# Patient Record
Sex: Female | Born: 1980 | Race: White | Hispanic: No | Marital: Single | Smoking: Current every day smoker
Health system: Southern US, Community
[De-identification: ages and names within clinical notes are randomized; demographics above are authoritative.]

## PROBLEM LIST (undated history)

## (undated) DIAGNOSIS — O139 Gestational [pregnancy-induced] hypertension without significant proteinuria, unspecified trimester: Secondary | ICD-10-CM

## (undated) DIAGNOSIS — O1490 Unspecified pre-eclampsia, unspecified trimester: Secondary | ICD-10-CM

## (undated) DIAGNOSIS — Z87442 Personal history of urinary calculi: Secondary | ICD-10-CM

## (undated) HISTORY — PX: COLPOSCOPY: SHX161

---

## 1999-10-18 DIAGNOSIS — O1490 Unspecified pre-eclampsia, unspecified trimester: Secondary | ICD-10-CM

## 2000-11-07 HISTORY — PX: GYNECOLOGIC CRYOSURGERY: SHX857

## 2003-03-27 ENCOUNTER — Encounter (HOSPITAL_COMMUNITY): Admission: RE | Admit: 2003-03-27 | Discharge: 2003-04-10 | Payer: Self-pay | Admitting: Rheumatology

## 2003-06-29 ENCOUNTER — Emergency Department (HOSPITAL_COMMUNITY): Admission: EM | Admit: 2003-06-29 | Discharge: 2003-06-29 | Payer: Self-pay | Admitting: *Deleted

## 2004-07-06 ENCOUNTER — Emergency Department (HOSPITAL_COMMUNITY): Admission: EM | Admit: 2004-07-06 | Discharge: 2004-07-06 | Payer: Self-pay | Admitting: Emergency Medicine

## 2007-01-14 ENCOUNTER — Emergency Department (HOSPITAL_COMMUNITY): Admission: EM | Admit: 2007-01-14 | Discharge: 2007-01-14 | Payer: Self-pay | Admitting: Emergency Medicine

## 2009-01-18 ENCOUNTER — Emergency Department (HOSPITAL_COMMUNITY): Admission: EM | Admit: 2009-01-18 | Discharge: 2009-01-18 | Payer: Self-pay | Admitting: Emergency Medicine

## 2010-05-17 ENCOUNTER — Emergency Department (HOSPITAL_COMMUNITY): Admission: EM | Admit: 2010-05-17 | Discharge: 2010-05-17 | Payer: Self-pay | Admitting: Emergency Medicine

## 2010-09-21 LAB — URINALYSIS, ROUTINE W REFLEX MICROSCOPIC
Bilirubin Urine: NEGATIVE
Leukocytes, UA: NEGATIVE
Nitrite: POSITIVE — AB
Specific Gravity, Urine: >1.03 — ABNORMAL HIGH (ref 1.005–1.030)
Urobilinogen, UA: 1 mg/dL (ref 0.0–1.0)
pH: 6 (ref 5.0–8.0)

## 2010-09-21 LAB — PREGNANCY, URINE: Preg Test, Ur: NEGATIVE

## 2010-09-21 LAB — RPR: RPR Ser Ql: NONREACTIVE

## 2010-09-21 LAB — URINE MICROSCOPIC-ADD ON

## 2010-09-21 LAB — WET PREP, GENITAL: Trich, Wet Prep: NONE SEEN

## 2010-09-21 LAB — GC/CHLAMYDIA PROBE AMP, GENITAL: Chlamydia, DNA Probe: NEGATIVE

## 2010-11-26 NOTE — Consult Note (Signed)
NAME:  Santita, Hunsberger NO.:  [id number]   MEDICAL RECORD NO.:  [id number]                   PATIENT TYPE:   LOCATION:                                       FACILITY:   PHYSICIAN:  Aundra Dubin, M.D.            DATE OF BIRTH:   DATE OF CONSULTATION:  03/27/2003  DATE OF DISCHARGE:                                   CONSULTATION   REFERRING PHYSICIAN:  Karsten Ro, M.D., [facility], [place]   CHIEF COMPLAINT:  Right hand.   HISTORY OF PRESENT ILLNESS:  Ms. Cerullo is being seen at [facility] as a referral  from the [facility].  For about four months, she has had what  she describes as severe hand swelling to the point where the fingers will  lock.  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  She  has been treated with prednisone for probably one week which did help some.  There is no numbness or tingling.  The left hand wrist does not bother her  as much, and there is no throbbing to the left hand.  She aches some in the  right knee, and this has been a problem for several years.  There has been  no swollen joints.   REVIEW OF SYSTEMS:  Her energy level is good.  She has had no fever.  She  has had problems with boils in the axilla.  There has been no weight loss.  She says she keeps a headache.  There has been no diarrhea, constipation or  a lot of mucous.  There has been no chest pain or shortness of breath.   PAST MEDICAL/SURGICAL HISTORY:  1. C-section 2001.  2. Hypertension.  She is on no medications for this.   MEDICATIONS:  Oral contraceptive.   ALLERGIES:  Drug intolerance is none.   FAMILY HISTORY:  Father died at age 51 with brain cancer.  Sister age 79  died with cancer.   SOCIAL HISTORY:  She is [place] native.  She is single and lives with her  daughter.  She works as a Child psychotherapist, again, smoking at age 1, and smokes a  pack of cigarettes a day.   PHYSICAL EXAMINATION:  VITAL SIGNS:   Weight 145 pounds, height 5 feet 3  inches, blood pressure 110/70, respirations 16.  GENERAL APPEARANCE:  No distress.  SKIN:  Clear except a low back and left ankle tattoo.  HEENT:  PERL/EOMI.  Mouth:  Clear except for a tongue stud.  NECK:  Slight upper cervical adenopathy.  LUNGS:  Clear.  HEART:  Regular, no murmur.  MUSCULOSKELETAL:  The right hand shows no arthritic swelling.  Palpation  across the MTP's and PIP's is nontender.  Palpation into the right palmar  MCP's finds that they are somewhat tender, but there is no wincing.  She had  a  negative Tinel's sign.  The left hand was nonswollen, nontender with a  negative Tinel's sign.  The elbow, shoulders, left knee, ankles and feet  have a good range of motion, show no active arthritis.  The right knee  flexes well to 135 degrees without tenderness.  She had mild to moderate  lateral joint line tenderness.  NEUROLOGICAL:  Nonfocal.   ASSESSMENT/PLAN:  1. Hand pain.  I suspect that she may have some trigger finger or tendonitis     to the tendon sheath.  Although her Tinel's sign seemed to be negative,     possibly this could be a carpal tunnel syndrome.  I have given her a     prescription for Naproxen 500 mg b.i.d.  She says only the prednisone has     helped in the past, but I certainly would not keep her on this except for     periodic treatment.  If the hand does not improve, then she may need to     be worked up more fully for carpal tunnel.  2. Smoking.  3. Right knee.  This is a fairly old problem which still persists.  She has     episodic swelling, possibly the Naproxen will help with this.  Possibly a     cortisone injection should be performed to the knee if it is continuing     to be problematic.   FOLLOWUP:  She will return on a p.r.n. basis.                                               Aundra Dubin, M.D.    WWT/MEDQ  D:  03/27/2003  T:  03/28/2003  Job:  [id number]   cc:   [facility]  [place]  [address]  [place], [place] [address]  Fax: [phone]

## 2014-05-12 ENCOUNTER — Other Ambulatory Visit: Payer: Self-pay | Admitting: Obstetrics and Gynecology

## 2014-05-12 DIAGNOSIS — O3680X0 Pregnancy with inconclusive fetal viability, not applicable or unspecified: Secondary | ICD-10-CM

## 2014-05-14 ENCOUNTER — Encounter: Payer: Self-pay | Admitting: Obstetrics and Gynecology

## 2014-05-14 ENCOUNTER — Other Ambulatory Visit: Payer: Self-pay | Admitting: Obstetrics and Gynecology

## 2014-05-14 ENCOUNTER — Ambulatory Visit (INDEPENDENT_AMBULATORY_CARE_PROVIDER_SITE_OTHER): Payer: Medicaid Other

## 2014-05-14 DIAGNOSIS — O09521 Supervision of elderly multigravida, first trimester: Secondary | ICD-10-CM

## 2014-05-14 DIAGNOSIS — O30001 Twin pregnancy, unspecified number of placenta and unspecified number of amniotic sacs, first trimester: Secondary | ICD-10-CM

## 2014-05-14 DIAGNOSIS — O09291 Supervision of pregnancy with other poor reproductive or obstetric history, first trimester: Secondary | ICD-10-CM

## 2014-05-14 DIAGNOSIS — O3680X0 Pregnancy with inconclusive fetal viability, not applicable or unspecified: Secondary | ICD-10-CM

## 2014-05-14 NOTE — Progress Notes (Signed)
U/S-Twin IUP Di/Di cx appears closed, bilateral adnexa appears WNL, Baby A- CRL c/w 8+5wks +FCA noted FHR-175 bpm, Baby B- CRL c/w 8+5 wks, +FCA noted FHR-184 bpm

## 2014-05-19 LAB — US OB COMP LESS 14 WKS

## 2014-05-26 ENCOUNTER — Other Ambulatory Visit (HOSPITAL_COMMUNITY)
Admission: RE | Admit: 2014-05-26 | Discharge: 2014-05-26 | Disposition: A | Discharge status: Home / Self Care | Payer: Medicaid Other | Source: Ambulatory Visit | Attending: Obstetrics & Gynecology | Admitting: Obstetrics & Gynecology

## 2014-05-26 ENCOUNTER — Ambulatory Visit (INDEPENDENT_AMBULATORY_CARE_PROVIDER_SITE_OTHER): Payer: Medicaid Other | Admitting: Women's Health

## 2014-05-26 ENCOUNTER — Encounter: Payer: Self-pay | Admitting: Women's Health

## 2014-05-26 VITALS — BP 124/78 | Ht 62.0 in | Wt 162.0 lb

## 2014-05-26 DIAGNOSIS — O09299 Supervision of pregnancy with other poor reproductive or obstetric history, unspecified trimester: Secondary | ICD-10-CM | POA: Insufficient documentation

## 2014-05-26 DIAGNOSIS — Z114 Encounter for screening for human immunodeficiency virus [HIV]: Secondary | ICD-10-CM

## 2014-05-26 DIAGNOSIS — O09291 Supervision of pregnancy with other poor reproductive or obstetric history, first trimester: Secondary | ICD-10-CM

## 2014-05-26 DIAGNOSIS — Z113 Encounter for screening for infections with a predominantly sexual mode of transmission: Secondary | ICD-10-CM | POA: Insufficient documentation

## 2014-05-26 DIAGNOSIS — Z01419 Encounter for gynecological examination (general) (routine) without abnormal findings: Secondary | ICD-10-CM | POA: Insufficient documentation

## 2014-05-26 DIAGNOSIS — Z3491 Encounter for supervision of normal pregnancy, unspecified, first trimester: Secondary | ICD-10-CM

## 2014-05-26 DIAGNOSIS — Z98891 History of uterine scar from previous surgery: Secondary | ICD-10-CM

## 2014-05-26 DIAGNOSIS — Z0283 Encounter for blood-alcohol and blood-drug test: Secondary | ICD-10-CM

## 2014-05-26 DIAGNOSIS — O30042 Twin pregnancy, dichorionic/diamniotic, second trimester: Secondary | ICD-10-CM

## 2014-05-26 DIAGNOSIS — Z331 Pregnant state, incidental: Secondary | ICD-10-CM

## 2014-05-26 DIAGNOSIS — Z0184 Encounter for antibody response examination: Secondary | ICD-10-CM

## 2014-05-26 DIAGNOSIS — Z1151 Encounter for screening for human papillomavirus (HPV): Secondary | ICD-10-CM | POA: Diagnosis present

## 2014-05-26 DIAGNOSIS — Z3401 Encounter for supervision of normal first pregnancy, first trimester: Secondary | ICD-10-CM

## 2014-05-26 DIAGNOSIS — O30041 Twin pregnancy, dichorionic/diamniotic, first trimester: Secondary | ICD-10-CM

## 2014-05-26 DIAGNOSIS — O09521 Supervision of elderly multigravida, first trimester: Secondary | ICD-10-CM

## 2014-05-26 DIAGNOSIS — O0991 Supervision of high risk pregnancy, unspecified, first trimester: Secondary | ICD-10-CM

## 2014-05-26 DIAGNOSIS — Z124 Encounter for screening for malignant neoplasm of cervix: Secondary | ICD-10-CM

## 2014-05-26 DIAGNOSIS — Z1389 Encounter for screening for other disorder: Secondary | ICD-10-CM

## 2014-05-26 DIAGNOSIS — O09891 Supervision of other high risk pregnancies, first trimester: Secondary | ICD-10-CM

## 2014-05-26 DIAGNOSIS — O09529 Supervision of elderly multigravida, unspecified trimester: Secondary | ICD-10-CM | POA: Insufficient documentation

## 2014-05-26 DIAGNOSIS — O30049 Twin pregnancy, dichorionic/diamniotic, unspecified trimester: Secondary | ICD-10-CM | POA: Insufficient documentation

## 2014-05-26 DIAGNOSIS — O09899 Supervision of other high risk pregnancies, unspecified trimester: Secondary | ICD-10-CM | POA: Insufficient documentation

## 2014-05-26 DIAGNOSIS — F17200 Nicotine dependence, unspecified, uncomplicated: Secondary | ICD-10-CM

## 2014-05-26 LAB — POCT URINALYSIS DIPSTICK
Glucose, UA: NEGATIVE
KETONES UA: NEGATIVE
NITRITE UA: NEGATIVE
PROTEIN UA: NEGATIVE

## 2014-05-26 MED ORDER — CONCEPT DHA 53.5-38-1 MG PO CAPS: 1.0000 | ORAL_CAPSULE | Freq: Every day | ORAL | Status: DC

## 2014-05-26 NOTE — Progress Notes (Signed)
Subjective:  Pamela Horn is a 33 y.o. 172P0101 Caucasian female at 8057w3d by 8wk u/s, being seen today for her first obstetrical visit.  Her obstetrical history is significant for smoker: 3ppd prior to pregnancy, now down to 1/2ppd and wants to quit. [facility]- unsure of incision- possibly interested in vbac if possible.  Pregnancy history fully reviewed. Last pap @ 33yo, was abnormal, had 'procedure' where they told her pre-cancerous cells, was afraid to go back  Patient reports no complaints. Denies vb, cramping, uti s/s, abnormal/malodorous vag d/c, or vulvovaginal itching/irritation.  BP 124/78 mmHg  Ht 5\' 2"  (1.575 m)  Wt 162 lb (73.483 kg)  BMI 29.62 kg/m2  LMP 03/03/2014 (Within Weeks)  HISTORY: OB History  Gravida Para Term Preterm AB SAB TAB Ectopic Multiple Living  2 1  1      1     # Outcome Date GA Lbr Len/2nd Weight Sex Delivery Anes PTL Lv  2 Current           1 Preterm 10/18/99 8940w0d  1 lb 2 oz (0.51 kg) F CS-Unspec EPI  Y     Complications: Pre-eclampsia,Toxemia in pregnancy     History reviewed. No pertinent past medical history. Past Surgical History  Procedure Laterality Date  . Colposcopy  04/04/2000 & 10/03/2000  . Gynecologic cryosurgery  11/07/2000  . Cesarean section  10/18/1999   Family History  Problem Relation Age of Onset  . Hypertension Mother   . Cancer Father     melanoma  . Cancer Sister   . Diabetes Paternal Grandfather     Exam   System:     General: Well developed & nourished, no acute distress   Skin: Warm & dry, normal coloration and turgor, no rashes   Neurologic: Alert & oriented, normal mood   Cardiovascular: Regular rate & rhythm   Respiratory: Effort & rate normal, LCTAB, acyanotic   Abdomen: Soft, non tender   Extremities: normal strength, tone   Pelvic Exam:    Perineum: Normal perineum   Vulva: Normal, no lesions   Vagina:  Normal mucosa, normal discharge   Cervix: Normal,  bulbous, appears closed   Uterus: Normal size/shape/contour for GA   Thin prep pap smear obtained w/ high risk HPV cotesting FHR: 172/184 via doppler   Assessment:   Pregnancy: [id number] Patient Active Problem List   Diagnosis Date Noted  . Supervision of other high-risk pregnancy 05/26/2014    Priority: High  . Dichorionic diamniotic twin gestation 05/26/2014    Priority: High  . History of cesarean delivery 05/26/2014    Priority: High  . Hx of preeclampsia, prior pregnancy, currently pregnant 05/26/2014    Priority: High  . AMA (advanced maternal age) multigravida 35+ 05/26/2014    Priority: High  . Smoker 05/26/2014    Priority: High    1157w3d G2P0101 New OB visit Smoker, wants to quit H/O severe pre-e w/ 25wk c/s H/O 25wk c/s, unknown incision H/O abnormal pap Di-Di Twin gestation AMA   Plan:  Initial labs drawn Continue prenatal vitamins Problem list reviewed and updated Reviewed n/v relief measures and warning s/s to report Reviewed recommended weight gain based on pre-gravid BMI Encouraged well-balanced diet Genetic Screening discussed Integrated Screen: declined Cystic fibrosis screening discussed declined Ultrasound discussed; fetal survey: requested Follow up in 4 weeks for hrob w/ MD [facility] completed [facility] Start 81mg  baby asa daily at [employer]12wks  Smokes 1/2ppd-  wants to quit, advised cessation, discussed risks to fetus while pregnant, to infant pp, and to herself. Offered QuitlineNC, accepted, referral sent.     Marge DuncansBooker, Rebekah Zackery Randall CNM, [facility] 05/26/2014 4:44 PM

## 2014-05-26 NOTE — Patient Instructions (Signed)
Begin taking a 81mg  baby aspirin daily at 12 weeks (06/06/14) of pregnancy to decrease risk of preeclampsia during pregnancy   Nausea & Vomiting  Have saltine crackers or pretzels by your bed and eat a few bites before you raise your head out of bed in the morning  Eat small frequent meals throughout the day instead of large meals  Drink plenty of fluids throughout the day to stay hydrated, just don't drink a lot of fluids with your meals.  This can make your stomach fill up faster making you feel sick  Do not brush your teeth right after you eat  Products with real ginger are good for nausea, like ginger ale and ginger hard candy Make sure it says made with real ginger!  Sucking on sour candy like lemon heads is also good for nausea  If your prenatal vitamins make you nauseated, take them at night so you will sleep through the nausea  Sea Bands  If you feel like you need medicine for the nausea & vomiting please let us know  If you are unable to keep any fluids or food down please let us know    First Trimester of Pregnancy The first trimester of pregnancy is from week 1 until the end of week 12 (months 1 through 3). A week after a sperm fertilizes an egg, the egg will implant on the wall of the uterus. This embryo will begin to develop into a baby. Genes from you and your partner are forming the baby. The female genes determine whether the baby is a boy or a girl. At 6-8 weeks, the eyes and face are formed, and the heartbeat can be seen on ultrasound. At the end of 12 weeks, all the baby's organs are formed.  Now that you are pregnant, you will want to do everything you can to have a healthy baby. Two of the most important things are to get good prenatal care and to follow your health care provider's instructions. Prenatal care is all the medical care you receive before the baby's birth. This care will help prevent, find, and treat any problems during the pregnancy and childbirth. BODY  CHANGES Your body goes through many changes during pregnancy. The changes vary from woman to woman.  11. You may gain or lose a couple of pounds at first. 12. You may feel sick to your stomach (nauseous) and throw up (vomit). If the vomiting is uncontrollable, call your health care provider. 13. You may tire easily. 14. You may develop headaches that can be relieved by medicines approved by your health care provider. 15. You may urinate more often. Painful urination may mean you have a bladder infection. 16. You may develop heartburn as a result of your pregnancy. 17. You may develop constipation because certain hormones are causing the muscles that push waste through your intestines to slow down. 18. You may develop hemorrhoids or swollen, bulging veins (varicose veins). 19. Your breasts may begin to grow larger and become tender. Your nipples may stick out more, and the tissue that surrounds them (areola) may become darker. 20. Your gums may bleed and may be sensitive to brushing and flossing. 21. Dark spots or blotches (chloasma, mask of pregnancy) may develop on your face. This will likely fade after the baby is born. 22. Your menstrual periods will stop. 23. You may have a loss of appetite. 24. You may develop cravings for certain kinds of food. 25. You may have changes in your emotions from  day to day, such as being excited to be pregnant or being concerned that something may go wrong with the pregnancy and baby. 26. You may have more vivid and strange dreams. 27. You may have changes in your hair. These can include thickening of your hair, rapid growth, and changes in texture. Some women also have hair loss during or after pregnancy, or hair that feels dry or thin. Your hair will most likely return to normal after your baby is born. WHAT TO EXPECT AT YOUR PRENATAL VISITS During a routine prenatal visit:  You will be weighed to make sure you and the baby are growing normally.  Your  blood pressure will be taken.  Your abdomen will be measured to track your baby's growth.  The fetal heartbeat will be listened to starting around week 10 or 12 of your pregnancy.  Test results from any previous visits will be discussed. Your health care provider may ask you:  How you are feeling.  If you are feeling the baby move.  If you have had any abnormal symptoms, such as leaking fluid, bleeding, severe headaches, or abdominal cramping.  If you have any questions. Other tests that may be performed during your first trimester include:  Blood tests to find your blood type and to check for the presence of any previous infections. They will also be used to check for low iron levels (anemia) and Rh antibodies. Later in the pregnancy, blood tests for diabetes will be done along with other tests if problems develop.  Urine tests to check for infections, diabetes, or protein in the urine.  An ultrasound to confirm the proper growth and development of the baby.  An amniocentesis to check for possible genetic problems.  Fetal screens for spina bifida and Down syndrome.  You may need other tests to make sure you and the baby are doing well. HOME CARE INSTRUCTIONS  Medicines  Follow your health care provider's instructions regarding medicine use. Specific medicines may be either safe or unsafe to take during pregnancy.  Take your prenatal vitamins as directed.  If you develop constipation, try taking a stool softener if your health care provider approves. Diet  Eat regular, well-balanced meals. Choose a variety of foods, such as meat or vegetable-based protein, fish, milk and low-fat dairy products, vegetables, fruits, and whole grain breads and cereals. Your health care provider will help you determine the amount of weight gain that is right for you.  Avoid raw meat and uncooked cheese. These carry germs that can cause birth defects in the baby.  Eating four or five small meals  rather than three large meals a day may help relieve nausea and vomiting. If you start to feel nauseous, eating a few soda crackers can be helpful. Drinking liquids between meals instead of during meals also seems to help nausea and vomiting.  If you develop constipation, eat more high-fiber foods, such as fresh vegetables or fruit and whole grains. Drink enough fluids to keep your urine clear or pale yellow. Activity and Exercise  Exercise only as directed by your health care provider. Exercising will help you:  Control your weight.  Stay in shape.  Be prepared for labor and delivery.  Experiencing pain or cramping in the lower abdomen or low back is a good sign that you should stop exercising. Check with your health care provider before continuing normal exercises.  Try to avoid standing for long periods of time. Move your legs often if you must stand in  one place for a long time.  Avoid heavy lifting.  Wear low-heeled shoes, and practice good posture.  You may continue to have sex unless your health care provider directs you otherwise. Relief of Pain or Discomfort  Wear a good support bra for breast tenderness.   Take warm sitz baths to soothe any pain or discomfort caused by hemorrhoids. Use hemorrhoid cream if your health care provider approves.   Rest with your legs elevated if you have leg cramps or low back pain.  If you develop varicose veins in your legs, wear support hose. Elevate your feet for 15 minutes, 3-4 times a day. Limit salt in your diet. Prenatal Care  Schedule your prenatal visits by the twelfth week of pregnancy. They are usually scheduled monthly at first, then more often in the last 2 months before delivery.  Write down your questions. Take them to your prenatal visits.  Keep all your prenatal visits as directed by your health care provider. Safety  Wear your seat belt at all times when driving.  Make a list of emergency phone numbers, including  numbers for family, friends, the hospital, and police and fire departments. General Tips  Ask your health care provider for a referral to a local prenatal education class. Begin classes no later than at the beginning of month 6 of your pregnancy.  Ask for help if you have counseling or nutritional needs during pregnancy. Your health care provider can offer advice or refer you to specialists for help with various needs.  Do not use hot tubs, steam rooms, or saunas.  Do not douche or use tampons or scented sanitary pads.  Do not cross your legs for long periods of time.  Avoid cat litter boxes and soil used by cats. These carry germs that can cause birth defects in the baby and possibly loss of the fetus by miscarriage or stillbirth.  Avoid all smoking, herbs, alcohol, and medicines not prescribed by your health care provider. Chemicals in these affect the formation and growth of the baby.  Schedule a dentist appointment. At home, brush your teeth with a soft toothbrush and be gentle when you floss. SEEK MEDICAL CARE IF:   You have dizziness.  You have mild pelvic cramps, pelvic pressure, or nagging pain in the abdominal area.  You have persistent nausea, vomiting, or diarrhea.  You have a bad smelling vaginal discharge.  You have pain with urination.  You notice increased swelling in your face, hands, legs, or ankles. SEEK IMMEDIATE MEDICAL CARE IF:   You have a fever.  You are leaking fluid from your vagina.  You have spotting or bleeding from your vagina.  You have severe abdominal cramping or pain.  You have rapid weight gain or loss.  You vomit blood or material that looks like coffee grounds.  You are exposed to [place] measles and have never had them.  You are exposed to fifth disease or chickenpox.  You develop a severe headache.  You have shortness of breath.  You have any kind of trauma, such as from a fall or a car accident. Document Released: 06/21/2001  Document Revised: 11/11/2013 Document Reviewed: 05/07/2013 [facility] Patient Information 2015 [employer], [place]. This information is not intended to replace advice given to you by your health care provider. Make sure you discuss any questions you have with your health care provider.

## 2014-05-27 LAB — URINALYSIS, ROUTINE W REFLEX MICROSCOPIC
BILIRUBIN URINE: NEGATIVE
GLUCOSE, UA: NEGATIVE mg/dL
Ketones, ur: NEGATIVE mg/dL
Nitrite: NEGATIVE
Protein, ur: NEGATIVE mg/dL
SPECIFIC GRAVITY, URINE: 1.024 (ref 1.005–1.030)
Urobilinogen, UA: 0.2 mg/dL (ref 0.0–1.0)
pH: 6 (ref 5.0–8.0)

## 2014-05-27 LAB — CBC
HCT: 36.3 % (ref 36.0–46.0)
HEMOGLOBIN: 12.2 g/dL (ref 12.0–15.0)
MCH: 28.6 pg (ref 26.0–34.0)
MCHC: 33.6 g/dL (ref 30.0–36.0)
MCV: 85 fL (ref 78.0–100.0)
MPV: 9 fL — AB (ref 9.4–12.4)
PLATELETS: 320 10*3/uL (ref 150–400)
RBC: 4.27 MIL/uL (ref 3.87–5.11)
RDW: 13.2 % (ref 11.5–15.5)
WBC: 13.9 10*3/uL — ABNORMAL HIGH (ref 4.0–10.5)

## 2014-05-27 LAB — DRUG SCREEN, URINE, NO CONFIRMATION
AMPHETAMINE SCRN UR: NEGATIVE
Barbiturate Quant, Ur: NEGATIVE
Benzodiazepines.: NEGATIVE
COCAINE METABOLITES: NEGATIVE
Creatinine,U: 144.2 mg/dL
MARIJUANA METABOLITE: NEGATIVE
Methadone: NEGATIVE
OPIATE SCREEN, URINE: NEGATIVE
PHENCYCLIDINE (PCP): NEGATIVE
Propoxyphene: NEGATIVE

## 2014-05-27 LAB — HIV ANTIBODY (ROUTINE TESTING W REFLEX): HIV 1&2 Ab, 4th Generation: NONREACTIVE

## 2014-05-27 LAB — URINALYSIS, MICROSCOPIC ONLY
Casts: NONE SEEN
Crystals: NONE SEEN

## 2014-05-27 LAB — OXYCODONE SCREEN, UA, RFLX CONFIRM: Oxycodone Screen, Ur: NEGATIVE ng/mL

## 2014-05-27 LAB — HEPATITIS B SURFACE ANTIGEN: HEP B S AG: NEGATIVE

## 2014-05-27 LAB — ABO AND RH: Rh Type: POSITIVE

## 2014-05-27 LAB — VARICELLA ZOSTER ANTIBODY, IGG: VARICELLA IGG: 211 {index} — AB (ref <135.00)

## 2014-05-27 LAB — RUBELLA SCREEN: Rubella: 2.39 Index — ABNORMAL HIGH (ref <0.90)

## 2014-05-27 LAB — RPR

## 2014-05-27 LAB — ANTIBODY SCREEN: ANTIBODY SCREEN: NEGATIVE

## 2014-05-28 ENCOUNTER — Telehealth: Payer: Self-pay | Admitting: Women's Health

## 2014-05-28 DIAGNOSIS — R8271 Bacteriuria: Secondary | ICD-10-CM | POA: Insufficient documentation

## 2014-05-28 DIAGNOSIS — O9989 Other specified diseases and conditions complicating pregnancy, childbirth and the puerperium: Principal | ICD-10-CM

## 2014-05-28 DIAGNOSIS — O99891 Other specified diseases and conditions complicating pregnancy: Secondary | ICD-10-CM

## 2014-05-28 LAB — CYTOLOGY - PAP

## 2014-05-28 MED ORDER — CEPHALEXIN 500 MG PO CAPS: 500.0000 mg | ORAL_CAPSULE | Freq: Four times a day (QID) | ORAL | Status: DC

## 2014-05-28 NOTE — Telephone Encounter (Signed)
Notified pt of UTI, Rx for Keflex sent to pharmacy.  Cheral MarkerKimberly R. Dejaun Vidrio, CNM, [facility]NP-BC 05/28/2014 3:16 PM

## 2014-05-29 LAB — URINE CULTURE

## 2014-06-03 ENCOUNTER — Ambulatory Visit (INDEPENDENT_AMBULATORY_CARE_PROVIDER_SITE_OTHER): Payer: Medicaid Other | Admitting: Obstetrics & Gynecology

## 2014-06-03 ENCOUNTER — Encounter: Payer: Self-pay | Admitting: Obstetrics & Gynecology

## 2014-06-03 VITALS — BP 110/70 | Wt 162.0 lb

## 2014-06-03 DIAGNOSIS — O30041 Twin pregnancy, dichorionic/diamniotic, first trimester: Secondary | ICD-10-CM

## 2014-06-03 DIAGNOSIS — B351 Tinea unguium: Secondary | ICD-10-CM

## 2014-06-03 DIAGNOSIS — Z331 Pregnant state, incidental: Secondary | ICD-10-CM

## 2014-06-03 NOTE — Progress Notes (Signed)
Pt has both great toes with nail fungus Treated with gentian violet 1%, home management discussed Follow up 2 weeks Begin baby ASA

## 2014-06-17 ENCOUNTER — Ambulatory Visit (INDEPENDENT_AMBULATORY_CARE_PROVIDER_SITE_OTHER): Payer: Medicaid Other | Admitting: Obstetrics & Gynecology

## 2014-06-17 VITALS — BP 124/68 | Wt 166.0 lb

## 2014-06-17 DIAGNOSIS — O0992 Supervision of high risk pregnancy, unspecified, second trimester: Secondary | ICD-10-CM

## 2014-06-17 DIAGNOSIS — Z1389 Encounter for screening for other disorder: Secondary | ICD-10-CM

## 2014-06-17 DIAGNOSIS — O30042 Twin pregnancy, dichorionic/diamniotic, second trimester: Secondary | ICD-10-CM

## 2014-06-17 DIAGNOSIS — Z331 Pregnant state, incidental: Secondary | ICD-10-CM

## 2014-06-17 LAB — POCT URINALYSIS DIPSTICK
Blood, UA: NEGATIVE
Glucose, UA: NEGATIVE
KETONES UA: NEGATIVE
NITRITE UA: NEGATIVE
PROTEIN UA: NEGATIVE

## 2014-06-17 NOTE — Progress Notes (Signed)
Declines integrated testing Toenails are much better on gentian violet   High Risk Pregnancy Diagnosis(es):   Twins and history of severe pre eclampsia  G2P0101 9965w4d Estimated Date of Delivery: 12/19/14  Blood pressure 124/68, weight 166 lb (75.297 kg), last menstrual period 03/03/2014.  Urinalysis: Negative   HPI: no complaints     BP weight and urine results all reviewed and noted. Patient reports good fetal movement, denies any bleeding and no rupture of membranes symptoms or regular contractions.  Fundal Height:  na Fetal Heart rate:  172 Edema:  none  Patient is without complaints. All questions were answered.  Assessment:  7265w4d,   twins  Medication(s) Plans:  Baby ASA  Treatment Plan:  twins  Follow up in keep weeks for OB appt,

## 2014-06-23 ENCOUNTER — Encounter: Payer: Self-pay | Admitting: Obstetrics & Gynecology

## 2014-06-23 ENCOUNTER — Other Ambulatory Visit (INDEPENDENT_AMBULATORY_CARE_PROVIDER_SITE_OTHER): Payer: Medicaid Other

## 2014-06-23 ENCOUNTER — Other Ambulatory Visit: Payer: Self-pay | Admitting: Obstetrics & Gynecology

## 2014-06-23 ENCOUNTER — Ambulatory Visit (INDEPENDENT_AMBULATORY_CARE_PROVIDER_SITE_OTHER): Payer: Medicaid Other | Admitting: Obstetrics & Gynecology

## 2014-06-23 VITALS — BP 120/70 | Wt 171.0 lb

## 2014-06-23 DIAGNOSIS — O30042 Twin pregnancy, dichorionic/diamniotic, second trimester: Secondary | ICD-10-CM

## 2014-06-23 DIAGNOSIS — O09522 Supervision of elderly multigravida, second trimester: Secondary | ICD-10-CM

## 2014-06-23 DIAGNOSIS — Z1389 Encounter for screening for other disorder: Secondary | ICD-10-CM

## 2014-06-23 DIAGNOSIS — O0992 Supervision of high risk pregnancy, unspecified, second trimester: Secondary | ICD-10-CM

## 2014-06-23 DIAGNOSIS — Z331 Pregnant state, incidental: Secondary | ICD-10-CM

## 2014-06-23 LAB — US OB LIMITED

## 2014-06-23 LAB — POCT URINALYSIS DIPSTICK
Glucose, UA: NEGATIVE
Ketones, UA: NEGATIVE
LEUKOCYTES UA: NEGATIVE
NITRITE UA: NEGATIVE
PROTEIN UA: NEGATIVE
RBC UA: NEGATIVE

## 2014-06-23 NOTE — Progress Notes (Signed)
Sonogram is noted Report is done  No complaints No bleeding follow up in 3weeks

## 2014-06-23 NOTE — Progress Notes (Signed)
U/S(14+3wks)-Twin IUP Diamniotic/Dichorionic +membrane noted, cx appears closed (3+cm), bilateral adnexa appears, All Posterior Gr 0 placenta, Baby A- active fetus meas c/w dates, FHR-148 bpm ??Girl fetus, baby B- active fetus, meas c/w dates, FHR-161 bpm, ??Boy

## 2014-07-14 ENCOUNTER — Encounter: Payer: Self-pay | Admitting: Obstetrics & Gynecology

## 2014-07-14 ENCOUNTER — Ambulatory Visit (INDEPENDENT_AMBULATORY_CARE_PROVIDER_SITE_OTHER): Payer: Medicaid Other | Admitting: Obstetrics & Gynecology

## 2014-07-14 VITALS — BP 140/80 | Wt 179.0 lb

## 2014-07-14 DIAGNOSIS — O30042 Twin pregnancy, dichorionic/diamniotic, second trimester: Secondary | ICD-10-CM

## 2014-07-14 DIAGNOSIS — O0992 Supervision of high risk pregnancy, unspecified, second trimester: Secondary | ICD-10-CM

## 2014-07-14 DIAGNOSIS — IMO0001 Reserved for inherently not codable concepts without codable children: Secondary | ICD-10-CM

## 2014-07-14 NOTE — Progress Notes (Signed)
High Risk Pregnancy Diagnosis(es):   Pamela Horn Twins  G2P0101 [redacted]w[redacted]d Estimated Date of Delivery: 12/19/14  Blood pressure 140/80, weight 179 lb (81.194 kg), last menstrual period 03/03/2014.  Urinalysis: Positive for 1+ protein   HPI: C/O muscle cramps, discussed electrolytes and also compression of right knee     BP weight and urine results all reviewed and noted. Patient reports good fetal movement, denies any bleeding and no rupture of membranes symptoms or regular contractions.  Fundal Height:  20 Fetal Heart rate:  145 Edema:  trace  Patient is without complaints. All questions were answered.  Assessment:  [redacted]w[redacted]d,   Pamela Horn twins  Medication(s) Plans:  No changes  Treatment Plan:  Sonogram 3 weeks  Follow up in 3 weeks for OB appt, sonogram

## 2014-07-22 ENCOUNTER — Encounter: Payer: Self-pay | Admitting: Obstetrics & Gynecology

## 2014-08-05 ENCOUNTER — Other Ambulatory Visit: Payer: Self-pay | Admitting: Obstetrics & Gynecology

## 2014-08-05 ENCOUNTER — Ambulatory Visit (INDEPENDENT_AMBULATORY_CARE_PROVIDER_SITE_OTHER): Payer: Medicaid Other

## 2014-08-05 ENCOUNTER — Ambulatory Visit (INDEPENDENT_AMBULATORY_CARE_PROVIDER_SITE_OTHER): Payer: Medicaid Other | Admitting: Obstetrics & Gynecology

## 2014-08-05 ENCOUNTER — Encounter: Payer: Self-pay | Admitting: Obstetrics & Gynecology

## 2014-08-05 VITALS — BP 110/80 | Wt 190.0 lb

## 2014-08-05 DIAGNOSIS — Z3689 Encounter for other specified antenatal screening: Secondary | ICD-10-CM

## 2014-08-05 DIAGNOSIS — O09522 Supervision of elderly multigravida, second trimester: Secondary | ICD-10-CM

## 2014-08-05 DIAGNOSIS — O09292 Supervision of pregnancy with other poor reproductive or obstetric history, second trimester: Secondary | ICD-10-CM

## 2014-08-05 DIAGNOSIS — Z98891 History of uterine scar from previous surgery: Secondary | ICD-10-CM

## 2014-08-05 DIAGNOSIS — O30042 Twin pregnancy, dichorionic/diamniotic, second trimester: Secondary | ICD-10-CM

## 2014-08-05 DIAGNOSIS — Z9889 Other specified postprocedural states: Secondary | ICD-10-CM

## 2014-08-05 DIAGNOSIS — Z1389 Encounter for screening for other disorder: Secondary | ICD-10-CM

## 2014-08-05 DIAGNOSIS — IMO0001 Reserved for inherently not codable concepts without codable children: Secondary | ICD-10-CM

## 2014-08-05 DIAGNOSIS — Z36 Encounter for antenatal screening of mother: Secondary | ICD-10-CM

## 2014-08-05 DIAGNOSIS — O0992 Supervision of high risk pregnancy, unspecified, second trimester: Secondary | ICD-10-CM

## 2014-08-05 DIAGNOSIS — Z331 Pregnant state, incidental: Secondary | ICD-10-CM

## 2014-08-05 LAB — POCT URINALYSIS DIPSTICK
GLUCOSE UA: NEGATIVE
Ketones, UA: NEGATIVE
Leukocytes, UA: NEGATIVE
NITRITE UA: NEGATIVE
PROTEIN UA: NEGATIVE
RBC UA: NEGATIVE

## 2014-08-05 LAB — US OB DETAIL + 14 WK

## 2014-08-05 NOTE — Progress Notes (Signed)
U/S(20+4wks)-Twin IUP Di/Di +membrane noted, cx appears closed (3.4cm), bilateral adnexa appears WNL, All posterior gr 0 placenta Baby A- Breech active fetus, meas c/w dates, FHR- 151 bpm, female fetus, no obvious major abnl noted Baby B- Breech active fetus, meas c/w dates, FHR- 147 bpm, female fetus, no obvious major abnl noted

## 2014-08-05 NOTE — Progress Notes (Signed)
Sonogram is normal report is done, breech/breech   High Risk Pregnancy Diagnosis(es):   DiDi twins  [id number] 7760w4d Estimated Date of Delivery: 12/19/14  Blood pressure 110/80, weight 190 lb (86.183 kg), last menstrual period 03/03/2014.  Urinalysis: Negative   HPI: No complaints     BP weight and urine results all reviewed and noted. Patient reports good fetal movement, denies any bleeding and no rupture of membranes symptoms or regular contractions.  Fundal Height:  28 Fetal Heart rate:  151/147 Edema:  trace  Patient is without complaints. All questions were answered.  Assessment:  8360w4d,   Twins  Medication(s) Plans:  No changes  Treatment Plan:  n ochanges  Follow up in 4 weeks for OB appt, routine

## 2014-09-03 ENCOUNTER — Encounter: Payer: Self-pay | Admitting: Obstetrics & Gynecology

## 2014-09-03 ENCOUNTER — Ambulatory Visit (INDEPENDENT_AMBULATORY_CARE_PROVIDER_SITE_OTHER): Payer: Medicaid Other | Admitting: Obstetrics & Gynecology

## 2014-09-03 VITALS — BP 120/80 | Wt 202.0 lb

## 2014-09-03 DIAGNOSIS — Z331 Pregnant state, incidental: Secondary | ICD-10-CM

## 2014-09-03 DIAGNOSIS — O0992 Supervision of high risk pregnancy, unspecified, second trimester: Secondary | ICD-10-CM

## 2014-09-03 DIAGNOSIS — Z1389 Encounter for screening for other disorder: Secondary | ICD-10-CM

## 2014-09-03 DIAGNOSIS — O30042 Twin pregnancy, dichorionic/diamniotic, second trimester: Secondary | ICD-10-CM

## 2014-09-03 LAB — POCT URINALYSIS DIPSTICK
Glucose, UA: NEGATIVE
Ketones, UA: NEGATIVE
Nitrite, UA: NEGATIVE
Protein, UA: NEGATIVE
RBC UA: NEGATIVE

## 2014-09-03 NOTE — Progress Notes (Signed)
Fetal Surveillance Testing today:  None today   High Risk Pregnancy Diagnosis(es):   Mercie EonDi di twins  G2P0101 9369w5d Estimated Date of Delivery: 12/19/14  Blood pressure 120/80, weight 202 lb (91.627 kg), last menstrual period 03/03/2014.  Urinalysis: Negative   HPI: The patient is being seen today for ongoing management of twins. Today she reports pressure and back pain   BP weight and urine results all reviewed and noted. Patient reports good fetal movement, denies any bleeding and no rupture of membranes symptoms or regular contractions.  Fundal Height:  38 Fetal Heart rate:  157/152 Edema:  trace  Patient is without complaints other than noted in her HPI. All questions were answered.  All lab and sonogram results have been reviewed. Comments:  Wearing a pregnancy band  Assessment:  1.  Pregnancy at 8569w5d,  Estimated Date of Delivery: 12/19/14 :                          2.  DiDi twins                        3.  Breech breech  Medication(s) Plans:  No changes  Treatment Plan:  PN2 next visit  Follow up in 2 weeks for appointment for high risk OB care, PN2

## 2014-09-03 NOTE — Addendum Note (Signed)
Addended by: Gaylyn RongEVANS, Ezreal Turay A on: 09/03/2014 04:49 PM   Modules accepted: Orders

## 2014-09-18 ENCOUNTER — Ambulatory Visit (INDEPENDENT_AMBULATORY_CARE_PROVIDER_SITE_OTHER): Payer: Medicaid Other | Admitting: Obstetrics & Gynecology

## 2014-09-18 ENCOUNTER — Other Ambulatory Visit: Payer: Medicaid Other

## 2014-09-18 ENCOUNTER — Encounter: Payer: Self-pay | Admitting: Obstetrics & Gynecology

## 2014-09-18 VITALS — BP 102/70 | HR 72 | Wt 205.0 lb

## 2014-09-18 DIAGNOSIS — O0992 Supervision of high risk pregnancy, unspecified, second trimester: Secondary | ICD-10-CM

## 2014-09-18 DIAGNOSIS — Z331 Pregnant state, incidental: Secondary | ICD-10-CM

## 2014-09-18 DIAGNOSIS — Z113 Encounter for screening for infections with a predominantly sexual mode of transmission: Secondary | ICD-10-CM

## 2014-09-18 DIAGNOSIS — Z1389 Encounter for screening for other disorder: Secondary | ICD-10-CM

## 2014-09-18 DIAGNOSIS — Z131 Encounter for screening for diabetes mellitus: Secondary | ICD-10-CM

## 2014-09-18 DIAGNOSIS — Z114 Encounter for screening for human immunodeficiency virus [HIV]: Secondary | ICD-10-CM

## 2014-09-18 DIAGNOSIS — Z0184 Encounter for antibody response examination: Secondary | ICD-10-CM

## 2014-09-18 DIAGNOSIS — Z3483 Encounter for supervision of other normal pregnancy, third trimester: Secondary | ICD-10-CM

## 2014-09-18 DIAGNOSIS — O30042 Twin pregnancy, dichorionic/diamniotic, second trimester: Secondary | ICD-10-CM

## 2014-09-18 LAB — POCT URINALYSIS DIPSTICK
Blood, UA: NEGATIVE
GLUCOSE UA: NEGATIVE
KETONES UA: NEGATIVE
NITRITE UA: NEGATIVE
PROTEIN UA: NEGATIVE

## 2014-09-18 NOTE — Addendum Note (Signed)
Addended by: Criss AlvinePULLIAM, CHRYSTAL G on: 09/18/2014 11:06 AM   Modules accepted: Orders

## 2014-09-18 NOTE — Progress Notes (Signed)
Fetal Surveillance Testing today:  None   High Risk Pregnancy Diagnosis(es):   DiDi twins  [id number] 6343w6d Estimated Date of Delivery: 12/19/14  Blood pressure 102/70, pulse 72, weight 205 lb (92.987 kg), last menstrual period 03/03/2014.  Urinalysis: Negative   HPI: The patient is being seen today for ongoing management of Pamela Horn di twins. Today she reports back and pelvic pain   BP weight and urine results all reviewed and noted. Patient reports good fetal movement, denies any bleeding and no rupture of membranes symptoms or regular contractions.  Fundal Height:  38 Fetal Heart rate:  152/146 Edema:  none  Patient is without complaints other than noted in her HPI. All questions were answered.  All lab and sonogram results have been reviewed. Comments: abnormal: tqwins  Assessment:  1.  Pregnancy at 5443w6d,  Estimated Date of Delivery: 12/19/14 :                          2.  DiDi twins                        3.  Histroy of severe pre eclampsia  Medication(s) Plans:  Baby ASA  Treatment Plan:  Pt "forgot" she needed to be fasting today  Follow up in 2 weeks for appointment for high risk OB care, sonogra Monday PN2  Coming out of work at 28 weeks

## 2014-09-22 ENCOUNTER — Other Ambulatory Visit: Payer: Medicaid Other

## 2014-09-22 DIAGNOSIS — Z0184 Encounter for antibody response examination: Secondary | ICD-10-CM

## 2014-09-22 DIAGNOSIS — Z113 Encounter for screening for infections with a predominantly sexual mode of transmission: Secondary | ICD-10-CM

## 2014-09-22 DIAGNOSIS — Z3483 Encounter for supervision of other normal pregnancy, third trimester: Secondary | ICD-10-CM

## 2014-09-22 DIAGNOSIS — Z331 Pregnant state, incidental: Secondary | ICD-10-CM

## 2014-09-22 DIAGNOSIS — Z131 Encounter for screening for diabetes mellitus: Secondary | ICD-10-CM

## 2014-09-22 DIAGNOSIS — Z114 Encounter for screening for human immunodeficiency virus [HIV]: Secondary | ICD-10-CM

## 2014-09-23 LAB — GLUCOSE TOLERANCE, 2 HOURS W/ 1HR
GLUCOSE, 1 HOUR: 164 mg/dL (ref 65–179)
Glucose, 2 hour: 131 mg/dL (ref 65–152)
Glucose, Fasting: 92 mg/dL — ABNORMAL HIGH (ref 65–91)

## 2014-09-23 LAB — CBC
HCT: 33.2 % — ABNORMAL LOW (ref 34.0–46.6)
Hemoglobin: 11.2 g/dL (ref 11.1–15.9)
MCH: 29.5 pg (ref 26.6–33.0)
MCHC: 33.7 g/dL (ref 31.5–35.7)
MCV: 87 fL (ref 79–97)
PLATELETS: 319 10*3/uL (ref 150–379)
RBC: 3.8 x10E6/uL (ref 3.77–5.28)
RDW: 13.5 % (ref 12.3–15.4)
WBC: 17.9 10*3/uL — ABNORMAL HIGH (ref 3.4–10.8)

## 2014-09-23 LAB — HSV 2 ANTIBODY, IGG: HSV 2 Glycoprotein G Ab, IgG: <0.91 index (ref 0.00–0.90)

## 2014-09-23 LAB — RPR: RPR Ser Ql: NONREACTIVE

## 2014-09-23 LAB — HIV ANTIBODY (ROUTINE TESTING W REFLEX): HIV SCREEN 4TH GENERATION: NONREACTIVE

## 2014-09-23 LAB — ANTIBODY SCREEN: Antibody Screen: NEGATIVE

## 2014-09-25 DIAGNOSIS — Z029 Encounter for administrative examinations, unspecified: Secondary | ICD-10-CM

## 2014-09-30 ENCOUNTER — Other Ambulatory Visit: Payer: Self-pay | Admitting: Obstetrics & Gynecology

## 2014-09-30 DIAGNOSIS — O0992 Supervision of high risk pregnancy, unspecified, second trimester: Secondary | ICD-10-CM

## 2014-09-30 DIAGNOSIS — O30042 Twin pregnancy, dichorionic/diamniotic, second trimester: Secondary | ICD-10-CM

## 2014-09-30 DIAGNOSIS — O09522 Supervision of elderly multigravida, second trimester: Secondary | ICD-10-CM

## 2014-10-01 ENCOUNTER — Ambulatory Visit (INDEPENDENT_AMBULATORY_CARE_PROVIDER_SITE_OTHER): Payer: Medicaid Other

## 2014-10-01 ENCOUNTER — Ambulatory Visit (INDEPENDENT_AMBULATORY_CARE_PROVIDER_SITE_OTHER): Payer: Medicaid Other | Admitting: Advanced Practice Midwife

## 2014-10-01 VITALS — BP 112/64 | HR 88 | Wt 205.0 lb

## 2014-10-01 DIAGNOSIS — O30043 Twin pregnancy, dichorionic/diamniotic, third trimester: Secondary | ICD-10-CM | POA: Diagnosis not present

## 2014-10-01 DIAGNOSIS — Z1389 Encounter for screening for other disorder: Secondary | ICD-10-CM

## 2014-10-01 DIAGNOSIS — O0992 Supervision of high risk pregnancy, unspecified, second trimester: Secondary | ICD-10-CM | POA: Diagnosis not present

## 2014-10-01 DIAGNOSIS — R8271 Bacteriuria: Secondary | ICD-10-CM

## 2014-10-01 DIAGNOSIS — O09521 Supervision of elderly multigravida, first trimester: Secondary | ICD-10-CM

## 2014-10-01 DIAGNOSIS — O9989 Other specified diseases and conditions complicating pregnancy, childbirth and the puerperium: Principal | ICD-10-CM

## 2014-10-01 DIAGNOSIS — O09522 Supervision of elderly multigravida, second trimester: Secondary | ICD-10-CM | POA: Diagnosis not present

## 2014-10-01 DIAGNOSIS — Z331 Pregnant state, incidental: Secondary | ICD-10-CM

## 2014-10-01 DIAGNOSIS — O09291 Supervision of pregnancy with other poor reproductive or obstetric history, first trimester: Secondary | ICD-10-CM

## 2014-10-01 DIAGNOSIS — O09893 Supervision of other high risk pregnancies, third trimester: Secondary | ICD-10-CM

## 2014-10-01 DIAGNOSIS — O30042 Twin pregnancy, dichorionic/diamniotic, second trimester: Secondary | ICD-10-CM | POA: Diagnosis not present

## 2014-10-01 DIAGNOSIS — O99891 Other specified diseases and conditions complicating pregnancy: Secondary | ICD-10-CM

## 2014-10-01 LAB — POCT URINALYSIS DIPSTICK
GLUCOSE UA: NEGATIVE
Ketones, UA: NEGATIVE
NITRITE UA: NEGATIVE

## 2014-10-01 NOTE — Progress Notes (Signed)
US (4639w5d) Twin IUP DI/DI,CX appears closed 3cm,,bilat adnexa appears wnl,post pl grade 0 Baby A breech maternal rt,measurement c/w dates fht 169bpm,sdp of fluid 4.4cm Baby B cephlic, maternal lt,measurement c/w dates,fht 153 bpm,sdp of fluid 6.4  Cm EFW discordance 9%

## 2014-10-01 NOTE — Progress Notes (Signed)
Fetal Surveillance Testing today:  US for Growth:   Di/Di. .US (5130w5d) Twin IUP DI/DI,CX appears closed 3cm,,bilat adnexa appears wnl,post pl grade 0 Baby A breech maternal rt,measurement c/w dates fht 169bpm,sdp of fluid 4.4cm Baby B cephlic, maternal lt,measurement c/w dates,fht 153 bpm,sdp of fluid 6.4 Cm EFW discordance 9%    High Risk Pregnancy Diagnosis(es):   Di/Di twins  G2P0101 5930w5d Estimated Date of Delivery: 12/19/14  Last menstrual period 03/03/2014.  Urinalysis: Negative   HPI: The patient is being seen today for ongoing management of twin pregnancy,Today she reports no complaints   BP weight and urine results all reviewed and noted. Patient reports good fetal movement, denies any bleeding and no rupture of membranes symptoms or regular contractions.  Edema:  trace  Patient is without complaints other than noted in her HPI. All questions were answered.  All lab and sonogram results have been reviewed. Comments:  Assessment:  1.  Pregnancy at 7530w5d,  Estimated Date of Delivery: 12/19/14 :  Di/Di twins,                        2.  Normal growth and fluid                         Medication(s) Plans:  none  Treatment Plan:  Dr. Despina HiddenEure has told pt he was taking her out of work at 28 weeks.  Pt agrees   Follow up in 2 weeks for appointment for high risk OB care,

## 2014-10-08 LAB — US OB FOLLOW UP

## 2014-10-15 ENCOUNTER — Encounter: Payer: Self-pay | Admitting: Obstetrics & Gynecology

## 2014-10-15 ENCOUNTER — Ambulatory Visit (INDEPENDENT_AMBULATORY_CARE_PROVIDER_SITE_OTHER): Payer: Medicaid Other | Admitting: Obstetrics & Gynecology

## 2014-10-15 VITALS — BP 110/70 | HR 100 | Wt 209.0 lb

## 2014-10-15 DIAGNOSIS — N390 Urinary tract infection, site not specified: Secondary | ICD-10-CM

## 2014-10-15 DIAGNOSIS — O30043 Twin pregnancy, dichorionic/diamniotic, third trimester: Secondary | ICD-10-CM

## 2014-10-15 DIAGNOSIS — Z331 Pregnant state, incidental: Secondary | ICD-10-CM

## 2014-10-15 DIAGNOSIS — O09893 Supervision of other high risk pregnancies, third trimester: Secondary | ICD-10-CM

## 2014-10-15 DIAGNOSIS — Z1389 Encounter for screening for other disorder: Secondary | ICD-10-CM

## 2014-10-15 DIAGNOSIS — IMO0001 Reserved for inherently not codable concepts without codable children: Secondary | ICD-10-CM

## 2014-10-15 LAB — POCT URINALYSIS DIPSTICK
Glucose, UA: NEGATIVE
Nitrite, UA: POSITIVE
Protein, UA: 1

## 2014-10-15 MED ORDER — CEPHALEXIN 500 MG PO CAPS: 500.0000 mg | ORAL_CAPSULE | Freq: Four times a day (QID) | ORAL | Status: DC

## 2014-10-15 MED ORDER — FLUCONAZOLE 150 MG PO TABS: 150.0000 mg | ORAL_TABLET | Freq: Once | ORAL | Status: DC

## 2014-10-15 NOTE — Progress Notes (Signed)
Fetal Surveillance Testing today:  none   High Risk Pregnancy Diagnosis(es):   Twins, DiDi  [id number] 7474w5d Estimated Date of Delivery: 12/19/14  Blood pressure 110/70, pulse 100, weight 209 lb (94.802 kg), last menstrual period 03/03/2014.  Urinalysis: Positive for nitrates   HPI: The patient is being seen today for ongoing management of twins. Today she reports back ache and pelvic pressure   BP weight and urine results all reviewed and noted. Patient reports good fetal movement, denies any bleeding and no rupture of membranes symptoms or regular contractions.  Fundal Height:  41 Fetal Heart rate:  146/155 Edema:  trace  Patient is without complaints other than noted in her HPI. All questions were answered.  All lab and sonogram results have been reviewed. Comments: abnormal: twins   Assessment:  1.  Pregnancy at 4874w5d,  Estimated Date of Delivery: 12/19/14 :                          2.  Twins                        3.  UTI  Medication(s) Plans:  Keflex 500 QID  Treatment Plan:  Sonogram 2 weeks  Follow up in 2 sonogram weeks for appointment for high risk OB care,

## 2014-10-15 NOTE — Addendum Note (Signed)
Addended by: Criss AlvinePULLIAM, CHRYSTAL G on: 10/15/2014 03:49 PM   Modules accepted: Orders

## 2014-10-17 LAB — URINE CULTURE

## 2014-10-22 ENCOUNTER — Telehealth: Payer: Self-pay | Admitting: Obstetrics & Gynecology

## 2014-10-22 MED ORDER — NITROFURANTOIN MONOHYD MACRO 100 MG PO CAPS: 100.0000 mg | ORAL_CAPSULE | Freq: Two times a day (BID) | ORAL | Status: DC

## 2014-10-22 NOTE — Telephone Encounter (Signed)
Pt informed Macrobid e-scribed, stop taking Keflex. Pt verbalized understanding.

## 2014-10-27 ENCOUNTER — Other Ambulatory Visit: Payer: Self-pay | Admitting: Obstetrics & Gynecology

## 2014-10-27 DIAGNOSIS — IMO0001 Reserved for inherently not codable concepts without codable children: Secondary | ICD-10-CM

## 2014-10-29 ENCOUNTER — Ambulatory Visit (INDEPENDENT_AMBULATORY_CARE_PROVIDER_SITE_OTHER): Payer: Medicaid Other

## 2014-10-29 ENCOUNTER — Ambulatory Visit (INDEPENDENT_AMBULATORY_CARE_PROVIDER_SITE_OTHER): Payer: Medicaid Other | Admitting: Obstetrics & Gynecology

## 2014-10-29 VITALS — BP 120/80 | HR 104 | Wt 215.0 lb

## 2014-10-29 DIAGNOSIS — Z1389 Encounter for screening for other disorder: Secondary | ICD-10-CM

## 2014-10-29 DIAGNOSIS — Z331 Pregnant state, incidental: Secondary | ICD-10-CM | POA: Diagnosis not present

## 2014-10-29 DIAGNOSIS — O30043 Twin pregnancy, dichorionic/diamniotic, third trimester: Secondary | ICD-10-CM

## 2014-10-29 DIAGNOSIS — O09893 Supervision of other high risk pregnancies, third trimester: Secondary | ICD-10-CM

## 2014-10-29 DIAGNOSIS — R8271 Bacteriuria: Secondary | ICD-10-CM

## 2014-10-29 DIAGNOSIS — O09523 Supervision of elderly multigravida, third trimester: Secondary | ICD-10-CM

## 2014-10-29 DIAGNOSIS — O9989 Other specified diseases and conditions complicating pregnancy, childbirth and the puerperium: Principal | ICD-10-CM

## 2014-10-29 DIAGNOSIS — IMO0001 Reserved for inherently not codable concepts without codable children: Secondary | ICD-10-CM

## 2014-10-29 DIAGNOSIS — O09293 Supervision of pregnancy with other poor reproductive or obstetric history, third trimester: Secondary | ICD-10-CM

## 2014-10-29 LAB — US OB FOLLOW UP

## 2014-10-29 NOTE — Progress Notes (Signed)
Fetal Surveillance Testing today:  Sonogram is reviewed and report done, normal growth, Twin A Breech   High Risk Pregnancy Diagnosis(es):   Twins DiDi  [id number] 4459w5d Estimated Date of Delivery: 12/19/14  Blood pressure 120/80, pulse 104, weight 215 lb (97.523 kg), last menstrual period 03/03/2014.  Urinalysis: Negative   HPI: The patient is being seen today for ongoing management of twins. Today she reports pelvic pressure back ache   BP weight and urine results all reviewed and noted. Patient reports good fetal movement, denies any bleeding and no rupture of membranes symptoms or regular contractions.  Fundal Height:  43 Fetal Heart rate:  157/159 Edema:  2+  Patient is without complaints other than noted in her HPI. All questions were answered.  All lab and sonogram results have been reviewed. Comments: abnormal: twins A breech   Assessment:  1.  Pregnancy at 8759w5d,  Estimated Date of Delivery: 12/19/14 :                          2.  Twins                        3.  Twin A breech  Medication(s) Plans:  No changes  Treatment Plan:  Twice weekly surveillance  Follow up in Monday weeks for appointment for high risk OB care, NST

## 2014-10-29 NOTE — Progress Notes (Signed)
US TWINS, post pl,bilat adnexa's wnl,limited view of cx,  Baby A  Breech rt,pos fht 157bpm,sdp of fluid 4.4cm,EFW 2141g (4lbs12oz) 53.6%,discordance 0%                                                                                             Baby B  Cephalic lt, pos fht 159bpm,sdp of fluid 5.7cm,EFW 2001g (4lbs7oz) 43.2%,discordane 7%, limited view of head

## 2014-10-31 LAB — POCT URINALYSIS DIPSTICK
Blood, UA: NEGATIVE
Glucose, UA: NEGATIVE
Ketones, UA: NEGATIVE
LEUKOCYTES UA: NEGATIVE
NITRITE UA: NEGATIVE
Protein, UA: NEGATIVE

## 2014-11-04 ENCOUNTER — Ambulatory Visit (INDEPENDENT_AMBULATORY_CARE_PROVIDER_SITE_OTHER): Payer: Medicaid Other | Admitting: Obstetrics & Gynecology

## 2014-11-04 ENCOUNTER — Encounter: Payer: Self-pay | Admitting: Obstetrics & Gynecology

## 2014-11-04 VITALS — BP 140/90 | HR 92 | Wt 217.0 lb

## 2014-11-04 DIAGNOSIS — O09893 Supervision of other high risk pregnancies, third trimester: Secondary | ICD-10-CM

## 2014-11-04 DIAGNOSIS — Z331 Pregnant state, incidental: Secondary | ICD-10-CM | POA: Diagnosis not present

## 2014-11-04 DIAGNOSIS — N390 Urinary tract infection, site not specified: Secondary | ICD-10-CM | POA: Diagnosis not present

## 2014-11-04 DIAGNOSIS — O09293 Supervision of pregnancy with other poor reproductive or obstetric history, third trimester: Secondary | ICD-10-CM

## 2014-11-04 DIAGNOSIS — O30043 Twin pregnancy, dichorionic/diamniotic, third trimester: Secondary | ICD-10-CM

## 2014-11-04 DIAGNOSIS — O09523 Supervision of elderly multigravida, third trimester: Secondary | ICD-10-CM

## 2014-11-04 DIAGNOSIS — Z1389 Encounter for screening for other disorder: Secondary | ICD-10-CM

## 2014-11-04 LAB — POCT URINALYSIS DIPSTICK
GLUCOSE UA: NEGATIVE
KETONES UA: NEGATIVE
Leukocytes, UA: NEGATIVE
NITRITE UA: POSITIVE
Protein, UA: 1
RBC UA: NEGATIVE

## 2014-11-04 MED ORDER — NITROFURANTOIN MONOHYD MACRO 100 MG PO CAPS: 100.0000 mg | ORAL_CAPSULE | Freq: Two times a day (BID) | ORAL | Status: DC

## 2014-11-04 NOTE — Progress Notes (Signed)
Fetal Surveillance Testing today:  Reactive NST 2   High Risk Pregnancy Diagnosis(es):   Twins,  Gestational hypertension  G2P0101 6083w4d Estimated Date of Delivery: 12/19/14  Blood pressure 140/90, pulse 92, weight 217 lb (98.431 kg), last menstrual period 03/03/2014.  Urinalysis: Negative   HPI: The patient is being seen today for ongoing management of twins. Today she reports little swelling pelvic pressure back pain   BP weight and urine results all reviewed and noted. Patient reports good fetal movement, denies any bleeding and no rupture of membranes symptoms or regular contractions.  Fundal Height:  46 Fetal Heart rate:  140/145 Edema:  Trace  Patient is without complaints other than noted in her HPI. All questions were answered.  All lab and sonogram results have been reviewed. Comments: abnormal: Twins   Assessment:  1.  Pregnancy at 1683w4d,  Estimated Date of Delivery: 12/19/14 :                          2.  Twins                        3.  Evolving gestational hypertension  Medication(s) Plans:  We'll hold on medications at the present  Treatment Plan:  Twice weekly assessments  Follow up in 2 days for appointment for high risk OB care,

## 2014-11-06 ENCOUNTER — Ambulatory Visit (INDEPENDENT_AMBULATORY_CARE_PROVIDER_SITE_OTHER): Payer: Medicaid Other | Admitting: Obstetrics & Gynecology

## 2014-11-06 ENCOUNTER — Encounter: Payer: Self-pay | Admitting: Obstetrics & Gynecology

## 2014-11-06 VITALS — BP 140/94 | HR 76 | Wt 219.0 lb

## 2014-11-06 DIAGNOSIS — O163 Unspecified maternal hypertension, third trimester: Secondary | ICD-10-CM | POA: Diagnosis not present

## 2014-11-06 DIAGNOSIS — O30043 Twin pregnancy, dichorionic/diamniotic, third trimester: Secondary | ICD-10-CM | POA: Diagnosis not present

## 2014-11-06 DIAGNOSIS — Z331 Pregnant state, incidental: Secondary | ICD-10-CM | POA: Diagnosis not present

## 2014-11-06 DIAGNOSIS — Z1389 Encounter for screening for other disorder: Secondary | ICD-10-CM | POA: Diagnosis not present

## 2014-11-06 DIAGNOSIS — O0993 Supervision of high risk pregnancy, unspecified, third trimester: Secondary | ICD-10-CM

## 2014-11-06 DIAGNOSIS — IMO0001 Reserved for inherently not codable concepts without codable children: Secondary | ICD-10-CM

## 2014-11-06 LAB — POCT URINALYSIS DIPSTICK
Blood, UA: 1
Glucose, UA: NEGATIVE
Ketones, UA: NEGATIVE
Leukocytes, UA: NEGATIVE
Nitrite, UA: NEGATIVE
Protein, UA: 1

## 2014-11-06 LAB — URINE CULTURE

## 2014-11-06 NOTE — Progress Notes (Signed)
Work in [employer] appt:    Fetal Surveillance Testing today:  None today   High Risk Pregnancy Diagnosis(es):   Twins, concern over BP  G2P0101 3159w6d Estimated Date of Delivery: 12/19/14  Blood pressure 140/94, pulse 76, weight 219 lb (99.338 kg), last menstrual period 03/03/2014.  Urinalysis: Negative   HPI: The patient is being seen today for ongoing management of twins, h/o pre eclampsia. Today she reports good FM had nose bleed 2 nights ago and mild headache   BP weight and urine results all reviewed and noted. Patient reports good fetal movement, denies any bleeding and no rupture of membranes symptoms or regular contractions.  Fundal Height:  46 Fetal Heart rate:  146/136 Edema:  none  Patient is without complaints other than noted in her HPI. All questions were answered.  All lab and sonogram results have been reviewed. Comments: abnormal: twins   Assessment:  1.  Pregnancy at 159w6d,  Estimated Date of Delivery: 12/19/14 :                          2.  twins                        3.  Elevated BP concerns  Medication(s) Plans:  No changes  Treatment Plan:  Keep tomorrows appt and calibrate BP cuff  Follow up in tomorrow weeks for appointment for high risk OB care, scheduled

## 2014-11-06 NOTE — Addendum Note (Signed)
Addended by: Criss AlvinePULLIAM, CHRYSTAL G on: 11/06/2014 02:38 PM   Modules accepted: Orders

## 2014-11-07 ENCOUNTER — Ambulatory Visit (INDEPENDENT_AMBULATORY_CARE_PROVIDER_SITE_OTHER): Payer: Medicaid Other | Admitting: Obstetrics & Gynecology

## 2014-11-07 VITALS — BP 148/104 | HR 100

## 2014-11-07 DIAGNOSIS — O30043 Twin pregnancy, dichorionic/diamniotic, third trimester: Secondary | ICD-10-CM | POA: Diagnosis not present

## 2014-11-07 DIAGNOSIS — Z331 Pregnant state, incidental: Secondary | ICD-10-CM

## 2014-11-07 DIAGNOSIS — Z1389 Encounter for screening for other disorder: Secondary | ICD-10-CM

## 2014-11-07 DIAGNOSIS — O163 Unspecified maternal hypertension, third trimester: Secondary | ICD-10-CM | POA: Diagnosis not present

## 2014-11-07 LAB — CBC
HEMOGLOBIN: 11.5 g/dL (ref 11.1–15.9)
Hematocrit: 34 % (ref 34.0–46.6)
MCH: 28.3 pg (ref 26.6–33.0)
MCHC: 33.8 g/dL (ref 31.5–35.7)
MCV: 84 fL (ref 79–97)
Platelets: 340 10*3/uL (ref 150–379)
RBC: 4.06 x10E6/uL (ref 3.77–5.28)
RDW: 13.7 % (ref 12.3–15.4)
WBC: 12.3 10*3/uL — AB (ref 3.4–10.8)

## 2014-11-07 LAB — POCT URINALYSIS DIPSTICK
Blood, UA: 2
Glucose, UA: NEGATIVE
KETONES UA: NEGATIVE
NITRITE UA: NEGATIVE
Protein, UA: 1

## 2014-11-07 LAB — COMPREHENSIVE METABOLIC PANEL
A/G RATIO: 1.1 (ref 1.1–2.5)
ALBUMIN: 3 g/dL — AB (ref 3.5–5.5)
ALK PHOS: 109 IU/L (ref 39–117)
ALT: 8 IU/L (ref 0–32)
AST: 12 IU/L (ref 0–40)
BUN / CREAT RATIO: 10 (ref 8–20)
BUN: 9 mg/dL (ref 6–20)
Bilirubin Total: <0.2 mg/dL (ref 0.0–1.2)
CO2: 19 mmol/L (ref 18–29)
CREATININE: 0.91 mg/dL (ref 0.57–1.00)
Calcium: 8.8 mg/dL (ref 8.7–10.2)
Chloride: 107 mmol/L (ref 97–108)
GFR calc Af Amer: 94 mL/min/{1.73_m2} (ref >59)
GFR, EST NON AFRICAN AMERICAN: 81 mL/min/{1.73_m2} (ref >59)
Globulin, Total: 2.8 g/dL (ref 1.5–4.5)
Glucose: 111 mg/dL — ABNORMAL HIGH (ref 65–99)
POTASSIUM: 4.1 mmol/L (ref 3.5–5.2)
Sodium: 140 mmol/L (ref 134–144)
Total Protein: 5.8 g/dL — ABNORMAL LOW (ref 6.0–8.5)

## 2014-11-07 LAB — PROTEIN / CREATININE RATIO, URINE
CREATININE, UR: 133.7 mg/dL (ref 16.0–327.0)
Protein, Ur: 68.8 mg/dL — ABNORMAL HIGH (ref 0.0–15.0)
Protein/Creat Ratio: 515 mg/g creat — ABNORMAL HIGH (ref 0–200)

## 2014-11-07 MED ORDER — AMOXICILLIN-POT CLAVULANATE 875-125 MG PO TABS: 1.0000 | ORAL_TABLET | Freq: Two times a day (BID) | ORAL | Status: DC

## 2014-11-07 NOTE — Progress Notes (Signed)
Fetal Surveillance Testing today:  Reactive NST   High Risk Pregnancy Diagnosis(es):   Twins DiDi and hypertensive disorder of pregnancy G2P0101 5035w0d Estimated Date of Delivery: 12/19/14  Blood pressure 148/104, pulse 100, last menstrual period 03/03/2014.  Urinalysis: Positive for 1+   HPI: The patient is being seen today for ongoing management of twins, hypertensive disorder of pregnancy. Today she reports pressure   BP weight and urine results all reviewed and noted. Patient reports good fetal movement, denies any bleeding and no rupture of membranes symptoms or regular contractions.  Fundal Height:  46 Fetal Heart rate:  130/145 Edema:  trace  Patient is without complaints other than noted in her HPI. All questions were answered.  All lab and sonogram results have been reviewed. Comments: abnormal: PrCr raio  515 all other labs wnl  Assessment:  1.  Pregnancy at 1135w0d,  Estimated Date of Delivery: 12/19/14 :                          2.  Twins                        3.  Hypertensive disorder of pregnancy: GHTN vs pre eclampsia, checking 24 hour urine  Medication(s) Plans:  No changes  Treatment Plan:  Check urine  Follow up in Tuesday weeks for appointment for high risk OB care, sonogram

## 2014-11-10 ENCOUNTER — Other Ambulatory Visit: Payer: Self-pay | Admitting: Obstetrics & Gynecology

## 2014-11-10 ENCOUNTER — Inpatient Hospital Stay (HOSPITAL_COMMUNITY)
Admission: AD | Admit: 2014-11-10 | Discharge: 2014-11-11 | Disposition: A | Discharge status: Home / Self Care | Payer: Medicaid Other | Source: Ambulatory Visit | Attending: Obstetrics & Gynecology | Admitting: Obstetrics & Gynecology

## 2014-11-10 ENCOUNTER — Encounter (HOSPITAL_COMMUNITY): Payer: Self-pay | Admitting: *Deleted

## 2014-11-10 DIAGNOSIS — O30043 Twin pregnancy, dichorionic/diamniotic, third trimester: Secondary | ICD-10-CM

## 2014-11-10 DIAGNOSIS — O2343 Unspecified infection of urinary tract in pregnancy, third trimester: Secondary | ICD-10-CM | POA: Diagnosis not present

## 2014-11-10 DIAGNOSIS — O09893 Supervision of other high risk pregnancies, third trimester: Secondary | ICD-10-CM

## 2014-11-10 DIAGNOSIS — O163 Unspecified maternal hypertension, third trimester: Secondary | ICD-10-CM

## 2014-11-10 DIAGNOSIS — O30003 Twin pregnancy, unspecified number of placenta and unspecified number of amniotic sacs, third trimester: Secondary | ICD-10-CM | POA: Insufficient documentation

## 2014-11-10 DIAGNOSIS — R030 Elevated blood-pressure reading, without diagnosis of hypertension: Secondary | ICD-10-CM | POA: Diagnosis present

## 2014-11-10 DIAGNOSIS — O133 Gestational [pregnancy-induced] hypertension without significant proteinuria, third trimester: Secondary | ICD-10-CM

## 2014-11-10 DIAGNOSIS — O99333 Smoking (tobacco) complicating pregnancy, third trimester: Secondary | ICD-10-CM | POA: Insufficient documentation

## 2014-11-10 DIAGNOSIS — F17210 Nicotine dependence, cigarettes, uncomplicated: Secondary | ICD-10-CM | POA: Diagnosis not present

## 2014-11-10 DIAGNOSIS — R8271 Bacteriuria: Secondary | ICD-10-CM

## 2014-11-10 DIAGNOSIS — O9989 Other specified diseases and conditions complicating pregnancy, childbirth and the puerperium: Secondary | ICD-10-CM

## 2014-11-10 DIAGNOSIS — Z3A34 34 weeks gestation of pregnancy: Secondary | ICD-10-CM | POA: Insufficient documentation

## 2014-11-10 DIAGNOSIS — O09293 Supervision of pregnancy with other poor reproductive or obstetric history, third trimester: Secondary | ICD-10-CM

## 2014-11-10 DIAGNOSIS — O09523 Supervision of elderly multigravida, third trimester: Secondary | ICD-10-CM

## 2014-11-10 LAB — URINALYSIS, ROUTINE W REFLEX MICROSCOPIC
Bilirubin Urine: NEGATIVE
Glucose, UA: NEGATIVE mg/dL
Ketones, ur: NEGATIVE mg/dL
NITRITE: POSITIVE — AB
Protein, ur: 30 mg/dL — AB
SPECIFIC GRAVITY, URINE: 1.02 (ref 1.005–1.030)
Urobilinogen, UA: 0.2 mg/dL (ref 0.0–1.0)
pH: 6 (ref 5.0–8.0)

## 2014-11-10 LAB — URINE MICROSCOPIC-ADD ON

## 2014-11-10 LAB — CBC
HCT: 34.1 % — ABNORMAL LOW (ref 36.0–46.0)
HEMOGLOBIN: 11.7 g/dL — AB (ref 12.0–15.0)
MCH: 29.2 pg (ref 26.0–34.0)
MCHC: 34.3 g/dL (ref 30.0–36.0)
MCV: 85 fL (ref 78.0–100.0)
PLATELETS: 313 10*3/uL (ref 150–400)
RBC: 4.01 MIL/uL (ref 3.87–5.11)
RDW: 13.7 % (ref 11.5–15.5)
WBC: 14.8 10*3/uL — ABNORMAL HIGH (ref 4.0–10.5)

## 2014-11-10 MED ORDER — OXYCODONE-ACETAMINOPHEN 5-325 MG PO TABS
2.0000 | ORAL_TABLET | Freq: Once | ORAL | Status: AC
  Administered 2014-11-10: 2 via ORAL
  Filled 2014-11-10: qty 2

## 2014-11-10 NOTE — MAU Note (Signed)
Pincus BadderK Shaw CNM aware of pts arrival.

## 2014-11-10 NOTE — MAU Note (Signed)
[facility]     A-  IS  BREECH  BUT  B  IS  VERTEX.    HAS DONE  24 HR  UA FROM   [facility].     HAS H/A-   STARTED  SAT.-    TOOK NO MEDS.     LAST  NIGHT  HAD  BLURRED  VISION.  NO EPIGASTRIC  PAIN.     IN OFFICE   BP-  140/94  .      DENIES HSV AND    MRSA.      GBS-  UNSURE.

## 2014-11-11 ENCOUNTER — Ambulatory Visit (INDEPENDENT_AMBULATORY_CARE_PROVIDER_SITE_OTHER): Payer: Medicaid Other | Admitting: Obstetrics & Gynecology

## 2014-11-11 ENCOUNTER — Encounter: Payer: Self-pay | Admitting: Obstetrics & Gynecology

## 2014-11-11 ENCOUNTER — Ambulatory Visit (INDEPENDENT_AMBULATORY_CARE_PROVIDER_SITE_OTHER): Payer: 59

## 2014-11-11 VITALS — BP 144/90 | HR 100 | Wt 219.0 lb

## 2014-11-11 DIAGNOSIS — O99891 Other specified diseases and conditions complicating pregnancy: Secondary | ICD-10-CM

## 2014-11-11 DIAGNOSIS — O0993 Supervision of high risk pregnancy, unspecified, third trimester: Secondary | ICD-10-CM

## 2014-11-11 DIAGNOSIS — O09893 Supervision of other high risk pregnancies, third trimester: Secondary | ICD-10-CM

## 2014-11-11 DIAGNOSIS — O133 Gestational [pregnancy-induced] hypertension without significant proteinuria, third trimester: Secondary | ICD-10-CM

## 2014-11-11 DIAGNOSIS — Z331 Pregnant state, incidental: Secondary | ICD-10-CM

## 2014-11-11 DIAGNOSIS — Z1389 Encounter for screening for other disorder: Secondary | ICD-10-CM | POA: Diagnosis not present

## 2014-11-11 DIAGNOSIS — O30043 Twin pregnancy, dichorionic/diamniotic, third trimester: Secondary | ICD-10-CM

## 2014-11-11 DIAGNOSIS — O9989 Other specified diseases and conditions complicating pregnancy, childbirth and the puerperium: Principal | ICD-10-CM

## 2014-11-11 DIAGNOSIS — R8271 Bacteriuria: Secondary | ICD-10-CM

## 2014-11-11 DIAGNOSIS — O163 Unspecified maternal hypertension, third trimester: Secondary | ICD-10-CM | POA: Diagnosis not present

## 2014-11-11 DIAGNOSIS — O09293 Supervision of pregnancy with other poor reproductive or obstetric history, third trimester: Secondary | ICD-10-CM

## 2014-11-11 DIAGNOSIS — O09523 Supervision of elderly multigravida, third trimester: Secondary | ICD-10-CM

## 2014-11-11 LAB — POCT URINALYSIS DIPSTICK
Blood, UA: 3
Glucose, UA: NEGATIVE
KETONES UA: NEGATIVE
Leukocytes, UA: NEGATIVE
Nitrite, UA: NEGATIVE
Protein, UA: 2

## 2014-11-11 LAB — COMPREHENSIVE METABOLIC PANEL
ALK PHOS: 113 U/L (ref 38–126)
ALT: 11 U/L — AB (ref 14–54)
AST: 19 U/L (ref 15–41)
Albumin: 2.8 g/dL — ABNORMAL LOW (ref 3.5–5.0)
Anion gap: 8 (ref 5–15)
BUN: 9 mg/dL (ref 6–20)
CO2: 19 mmol/L — ABNORMAL LOW (ref 22–32)
Calcium: 8.9 mg/dL (ref 8.9–10.3)
Chloride: 110 mmol/L (ref 101–111)
Creatinine, Ser: 0.89 mg/dL (ref 0.44–1.00)
GFR calc non Af Amer: >60 mL/min (ref >60)
GLUCOSE: 112 mg/dL — AB (ref 70–99)
POTASSIUM: 3.7 mmol/L (ref 3.5–5.1)
Sodium: 137 mmol/L (ref 135–145)
TOTAL PROTEIN: 6.5 g/dL (ref 6.5–8.1)
Total Bilirubin: 0.1 mg/dL — ABNORMAL LOW (ref 0.3–1.2)

## 2014-11-11 LAB — PROTEIN, URINE, 24 HOUR
Protein, 24H Urine: 271.4 mg/24 hr — ABNORMAL HIGH (ref 30.0–150.0)
Protein, Ur: 23.1 mg/dL — ABNORMAL HIGH (ref 0.0–15.0)

## 2014-11-11 MED ORDER — LABETALOL HCL 100 MG PO TABS
100.0000 mg | ORAL_TABLET | Freq: Two times a day (BID) | ORAL | Status: DC
Start: 2014-11-11 — End: 2014-12-05

## 2014-11-11 NOTE — Discharge Instructions (Signed)

## 2014-11-11 NOTE — Progress Notes (Signed)
US TWINS: Baby A breech maternal rt,post pl, 7957w4d meas. C/w dates 2583g  (5lbs 11oz),RI.57,.62,post pl gr 1,BPP 8/8,sdp of fluid 7cm,fht149bpm                     Baby B cephalic maternal lt,meas.c/w dates 2508g 48% 5lbs 8oz,149bpm,sdp 7cm,BPP 8/8,fht 150bpm,RI.63 , .60                     Discordance 3%

## 2014-11-11 NOTE — MAU Provider Note (Signed)
History     CSN: [id number]  Arrival date and time: 11/10/14 2224   None     No chief complaint on file.  HPI Pamela Horn is a 34yo [id number] @ 34.4wks w/ twin preg who presents for eval of elevated BP at home and H/A off and on since Friday. She took her BP at home with her cuff which has recently been calibrated at [facility]. Has not tried any meds for her H/A. She denies visual disturbances, RUQ pain, N/V/D. No bldg, leaking or ctx. Her preg has been followed by the [facility] OB service and has been remarkable for 1) di-di twin preg 2) hx preeclampsia resulting in 25wk del via C/S 3) AMA 4) gHTN. Prenatal records rev'd. She began taking an rx for Augmentin on 4/30 for a UTI. She also has a 24 hr urine pending.  OB History    Gravida Para Term Preterm AB TAB SAB Ectopic Multiple Living   History reviewed. No pertinent past medical history.  Past Surgical History  Procedure Laterality Date  . Colposcopy  04/04/2000 & 10/03/2000  . Gynecologic cryosurgery  11/07/2000  . Cesarean section  10/18/1999    Family History  Problem Relation Age of Onset  . Hypertension Mother   . Cancer Father     melanoma  . Cancer Sister   . Diabetes Paternal Grandfather     History  Substance Use Topics  . Smoking status: Current Every Day Smoker -- 1.00 packs/day    Types: Cigarettes  . Smokeless tobacco: Not on file  . Alcohol Use: No    Allergies: No Known Allergies  Prescriptions prior to admission  Medication Sig Dispense Refill Last Dose  . aspirin 81 MG tablet Take 81 mg by mouth daily.   11/10/2014 at Unknown time  . FOLIC ACID PO Take by mouth.   11/10/2014 at Unknown time  . nitrofurantoin, macrocrystal-monohydrate, (MACROBID) 100 MG capsule Take 1 capsule (100 mg total) by mouth 2 (two) times daily. 14 capsule 0 11/10/2014 at Unknown time  . Prenat-FeFum-FePo-FA-Omega 3 (CONCEPT DHA) 53.5-38-1 MG CAPS Take 1 capsule by mouth daily. 30 capsule 11 11/10/2014 at Unknown time  .  amoxicillin-clavulanate (AUGMENTIN) 875-125 MG per tablet Take 1 tablet by mouth 2 (two) times daily. 14 tablet 0   . cephALEXin (KEFLEX) 500 MG capsule Take 1 capsule (500 mg total) by mouth 4 (four) times daily. X 7 days (Patient not taking: Reported on 06/17/2014) 28 capsule 0 Not Taking  . cephALEXin (KEFLEX) 500 MG capsule Take 1 capsule (500 mg total) by mouth 4 (four) times daily. (Patient not taking: Reported on 10/29/2014) 28 capsule 0 Not Taking  . fluconazole (DIFLUCAN) 150 MG tablet Take 1 tablet (150 mg total) by mouth once. Take the second tablet 3 days after the first one. (Patient not taking: Reported on 10/29/2014) 2 tablet 0 Not Taking  . nitrofurantoin, macrocrystal-monohydrate, (MACROBID) 100 MG capsule Take 1 capsule (100 mg total) by mouth 2 (two) times daily. 14 capsule 0 Taking  . nitrofurantoin, macrocrystal-monohydrate, (MACROBID) 100 MG capsule Take 1 capsule (100 mg total) by mouth 2 (two) times daily. (Patient not taking: Reported on 11/06/2014) 30 capsule 1 Not Taking    ROS No other pertinent info other than what is in HPI  Physical Exam   Blood pressure 124/78, pulse 109, temperature 98.1 F (36.7 C), temperature source Oral, resp. rate 18, height 5'  1" (1.549 m), weight 99.508 kg (219 lb 6 oz), last menstrual period 03/03/2014, SpO2 97 %. BPs: initially elevated @ 150/100, but all remaining DBP 120s/78-90  Physical Exam  Constitutional: She is oriented to person, place, and time. She appears well-developed.  HENT:  Head: Normocephalic.  Neck: Normal range of motion.  Cardiovascular: Normal rate.   BLE tr edema  Respiratory: Effort normal.  GI:  EFM x 2- baseline 140s, +accels, no decels Rare ctx per toco  Musculoskeletal: Normal range of motion.  Neurological: She is alert and oriented to person, place, and time. She has normal reflexes.  Skin: Skin is warm and dry.  Psychiatric: She has a normal mood and affect. Her behavior is normal. Thought content  normal.   CBC    Component Value Date/Time   WBC 14.8* 11/10/2014 2349   WBC 12.3* 11/06/2014 1454   RBC 4.01 11/10/2014 2349   RBC 4.06 11/06/2014 1454   HGB 11.7* 11/10/2014 2349   HCT 34.1* 11/10/2014 2349   HCT 34.0 11/06/2014 1454   PLT 313 11/10/2014 2349   MCV 85.0 11/10/2014 2349   MCH 29.2 11/10/2014 2349   MCH 28.3 11/06/2014 1454   MCHC 34.3 11/10/2014 2349   MCHC 33.8 11/06/2014 1454   RDW 13.7 11/10/2014 2349   RDW 13.7 11/06/2014 1454   CMP     Component Value Date/Time   NA 137 11/10/2014 2349   NA 140 11/06/2014 1454   K 3.7 11/10/2014 2349   CL 110 11/10/2014 2349   CO2 19* 11/10/2014 2349   GLUCOSE 112* 11/10/2014 2349   GLUCOSE 111* 11/06/2014 1454   BUN 9 11/10/2014 2349   BUN 9 11/06/2014 1454   CREATININE 0.89 11/10/2014 2349   CALCIUM 8.9 11/10/2014 2349   PROT 6.5 11/10/2014 2349   PROT 5.8* 11/06/2014 1454   ALBUMIN 2.8* 11/10/2014 2349   AST 19 11/10/2014 2349   ALT 11* 11/10/2014 2349   ALKPHOS 113 11/10/2014 2349   BILITOT 0.1* 11/10/2014 2349   BILITOT <0.2 11/06/2014 1454   GFRNONAA >60 11/10/2014 2349   GFRAA >60 11/10/2014 2349   Urinalysis    Component Value Date/Time   COLORURINE YELLOW 11/10/2014 2230   APPEARANCEUR CLEAR 11/10/2014 2230   LABSPEC 1.020 11/10/2014 2230   PHURINE 6.0 11/10/2014 2230   GLUCOSEU NEGATIVE 11/10/2014 2230   HGBUR SMALL* 11/10/2014 2230   BILIRUBINUR NEGATIVE 11/10/2014 2230   KETONESUR NEGATIVE 11/10/2014 2230   PROTEINUR 30* 11/10/2014 2230   PROTEINUR 1 11/07/2014 1152   UROBILINOGEN 0.2 11/10/2014 2230   NITRITE POSITIVE* 11/10/2014 2230   NITRITE neg 11/07/2014 1152   LEUKOCYTESUR LARGE* 11/10/2014 2230      MAU Course  Procedures  MDM CBC, CMET, UA collected Serial BPs rev'd NST x 2 rev'd Given Percocet #2 for H/A- completely relieved her pain  Assessment and Plan  Twin preg @ 34.4wks gHTN- stable labs & BPs UTI Hx preeclampsia  D/C home with preeclampsia warning  s/s Continue w/ rx Augmentin for UTI F/U as schedule at [facility] in the AM  McCurtainSHAW, KIMBERLY CNM 11/11/2014, 1:16 AM

## 2014-11-11 NOTE — Progress Notes (Signed)
Fetal Surveillance Testing today:  Sonogram is read and report done, BPP 8/8 x 2, reassuring assessemnt   High Risk Pregnancy Diagnosis(es):   Twins, Gestational Hypertension  G2P0101 554w4d Estimated Date of Delivery: 12/19/14  Blood pressure 144/90, pulse 100, weight 219 lb (99.338 kg), last menstrual period 03/03/2014.  Urinalysis: Positive for 2+ protein(24 hour urine 271 mg)   HPI: The patient is being seen today for ongoing management of twins, breech A gestational hypertension. Today she reports pelvic pressure back ache   BP weight and urine results all reviewed and noted. Patient reports good fetal movement, denies any bleeding and no rupture of membranes symptoms or regular contractions.  Fundal Height:  46 Fetal Heart rate:  149/150 Edema:  1-2+  Patient is without complaints other than noted in her HPI. All questions were answered.  All lab and sonogram results have been reviewed. Comments: normal labs all reviewed   Assessment:  1.  Pregnancy at 9254w4d,  Estimated Date of Delivery: 12/19/14 :                          2.  Gestational Hypertension                        3.  Twins:  DiDi breech A  Medication(s) Plans:  Start labetalol 100 BID(could be argued to start or not start  But her BP is labile)  Treatment Plan:  Continue twice weekly assessments, NST alternating with sonogram  Follow up in Friday weeks for appointment for high risk OB care, NST, sonogram and visit next week

## 2014-11-14 ENCOUNTER — Ambulatory Visit (INDEPENDENT_AMBULATORY_CARE_PROVIDER_SITE_OTHER): Payer: 59 | Admitting: Obstetrics and Gynecology

## 2014-11-14 ENCOUNTER — Encounter: Payer: Self-pay | Admitting: Obstetrics and Gynecology

## 2014-11-14 VITALS — BP 120/70 | HR 100 | Wt 223.0 lb

## 2014-11-14 DIAGNOSIS — O09523 Supervision of elderly multigravida, third trimester: Secondary | ICD-10-CM

## 2014-11-14 DIAGNOSIS — Z1389 Encounter for screening for other disorder: Secondary | ICD-10-CM

## 2014-11-14 DIAGNOSIS — O09893 Supervision of other high risk pregnancies, third trimester: Secondary | ICD-10-CM

## 2014-11-14 DIAGNOSIS — O30043 Twin pregnancy, dichorionic/diamniotic, third trimester: Secondary | ICD-10-CM | POA: Diagnosis not present

## 2014-11-14 DIAGNOSIS — Z331 Pregnant state, incidental: Secondary | ICD-10-CM

## 2014-11-14 DIAGNOSIS — O09293 Supervision of pregnancy with other poor reproductive or obstetric history, third trimester: Secondary | ICD-10-CM

## 2014-11-14 LAB — POCT URINALYSIS DIPSTICK
Blood, UA: NEGATIVE
GLUCOSE UA: NEGATIVE
KETONES UA: NEGATIVE
LEUKOCYTES UA: NEGATIVE
NITRITE UA: NEGATIVE
Protein, UA: 2

## 2014-11-14 NOTE — Progress Notes (Signed)
Pt denies any problems or concerns at this time.  

## 2014-11-14 NOTE — Progress Notes (Signed)
Fetal Surveillance Testing today:  nst   High Risk Pregnancy Diagnosis(es):   Twins, prior c/s . Gest htn, malpresentation baby A breech  G2P0101 2563w0d Estimated Date of Delivery: 12/19/14  Blood pressure 120/70, pulse 100, weight 223 lb (101.152 kg), last menstrual period 03/03/2014.  Urinalysis: Negative   HPI: The patient is being seen today for ongoing management of gest htn , etc... Today she reports no headaches, feels well  BP weight and urine results all reviewed and noted. Patient reports good fetal movement, denies any bleeding and no rupture of membranes symptoms or regular contractions.  Fundal Height:  47 Fetal Heart rate:  Twins confirmed reactive nst Edema:  1+  Patient is without complaints other than noted in her HPI. All questions were answered.  All lab and sonogram results have been reviewed. Comments: abnormal: proteinuria persists reflexes 1+   Assessment:  1.  Pregnancy at 3063w0d,  Estimated Date of Delivery: 12/19/14 :  Gest htn, twins prior c/s for repeat, currently scheduled for 5/24 [facility].                        2.  REactive NST x 2                        3.    Medication(s) Plans:  Labetalol 100 bid   Treatment Plan:  Continue biweekly testing,   Follow up in 0.5 weeks for appointment for high risk OB care, nst's or BPP Tuesday

## 2014-11-18 ENCOUNTER — Encounter: Payer: Self-pay | Admitting: Obstetrics & Gynecology

## 2014-11-18 ENCOUNTER — Ambulatory Visit (INDEPENDENT_AMBULATORY_CARE_PROVIDER_SITE_OTHER): Payer: Medicaid Other | Admitting: Obstetrics & Gynecology

## 2014-11-18 VITALS — BP 108/70 | HR 76 | Wt 219.0 lb

## 2014-11-18 DIAGNOSIS — Z1389 Encounter for screening for other disorder: Secondary | ICD-10-CM

## 2014-11-18 DIAGNOSIS — O09893 Supervision of other high risk pregnancies, third trimester: Secondary | ICD-10-CM

## 2014-11-18 DIAGNOSIS — Z331 Pregnant state, incidental: Secondary | ICD-10-CM | POA: Diagnosis not present

## 2014-11-18 DIAGNOSIS — O133 Gestational [pregnancy-induced] hypertension without significant proteinuria, third trimester: Secondary | ICD-10-CM | POA: Diagnosis not present

## 2014-11-18 DIAGNOSIS — O30043 Twin pregnancy, dichorionic/diamniotic, third trimester: Secondary | ICD-10-CM

## 2014-11-18 LAB — POCT URINALYSIS DIPSTICK
GLUCOSE UA: NEGATIVE
Ketones, UA: NEGATIVE
Nitrite, UA: NEGATIVE
PROTEIN UA: NEGATIVE

## 2014-11-18 NOTE — Addendum Note (Signed)
Addended by: Criss AlvinePULLIAM, Hyden Soley G on: 11/18/2014 04:31 PM   Modules accepted: Orders

## 2014-11-18 NOTE — Addendum Note (Signed)
Addended by: Criss AlvinePULLIAM, Rashonda Warrior G on: 11/18/2014 04:18 PM   Modules accepted: Orders

## 2014-11-18 NOTE — Progress Notes (Signed)
Fetal Surveillance Testing today:  Reactive NST   High Risk Pregnancy Diagnosis(es):   Twins DiDi, gestational HTN  G2P0101 4940w4d Estimated Date of Delivery: 12/19/14  Blood pressure 108/70, pulse 76, weight 219 lb (99.338 kg), last menstrual period 03/03/2014.  Urinalysis: Positive for 2+ protein   HPI: The patient is being seen today for ongoing management of Twins, gestational hypertension. Today she reports no sleep back ache   BP weight and urine results all reviewed and noted. Patient reports good fetal movement, denies any bleeding and no rupture of membranes symptoms or regular contractions.  Fundal Height:  46 Fetal Heart rate:  140/145 Edema:  1+  Patient is without complaints other than noted in her HPI. All questions were answered.  All lab and sonogram results have been reviewed. Comments: abnormal: twins   Assessment:  1.  Pregnancy at 6140w4d,  Estimated Date of Delivery: 12/19/14 :                          2.  Gestational HTN                        3.    Medication(s) Plans:  Garamycin drops for right otitis externa  Treatment Plan:  Twice weekly surveillance  Follow up in friday weeks for appointment for high risk OB care, sonogram

## 2014-11-18 NOTE — Addendum Note (Signed)
Addended by: Criss AlvinePULLIAM, CHRYSTAL G on: 11/18/2014 04:35 PM   Modules accepted: Orders

## 2014-11-19 LAB — PROTEIN / CREATININE RATIO, URINE
CREATININE, UR: 257.9 mg/dL
PROTEIN UR: 105.4 mg/dL
Protein/Creat Ratio: 409 mg/g creat — ABNORMAL HIGH (ref 0–200)

## 2014-11-21 ENCOUNTER — Other Ambulatory Visit: Payer: Medicaid Other | Admitting: Obstetrics & Gynecology

## 2014-11-21 ENCOUNTER — Ambulatory Visit (INDEPENDENT_AMBULATORY_CARE_PROVIDER_SITE_OTHER): Payer: Medicaid Other | Admitting: Obstetrics & Gynecology

## 2014-11-21 ENCOUNTER — Other Ambulatory Visit: Payer: Self-pay | Admitting: Obstetrics & Gynecology

## 2014-11-21 ENCOUNTER — Ambulatory Visit (INDEPENDENT_AMBULATORY_CARE_PROVIDER_SITE_OTHER): Payer: 59

## 2014-11-21 ENCOUNTER — Encounter: Payer: Self-pay | Admitting: Obstetrics & Gynecology

## 2014-11-21 VITALS — BP 110/70 | HR 100 | Wt 222.0 lb

## 2014-11-21 DIAGNOSIS — O09893 Supervision of other high risk pregnancies, third trimester: Secondary | ICD-10-CM

## 2014-11-21 DIAGNOSIS — O133 Gestational [pregnancy-induced] hypertension without significant proteinuria, third trimester: Secondary | ICD-10-CM | POA: Diagnosis not present

## 2014-11-21 DIAGNOSIS — O30043 Twin pregnancy, dichorionic/diamniotic, third trimester: Secondary | ICD-10-CM | POA: Diagnosis not present

## 2014-11-21 DIAGNOSIS — O09293 Supervision of pregnancy with other poor reproductive or obstetric history, third trimester: Secondary | ICD-10-CM

## 2014-11-21 DIAGNOSIS — Z1389 Encounter for screening for other disorder: Secondary | ICD-10-CM | POA: Diagnosis not present

## 2014-11-21 DIAGNOSIS — Z331 Pregnant state, incidental: Secondary | ICD-10-CM | POA: Diagnosis not present

## 2014-11-21 DIAGNOSIS — R8271 Bacteriuria: Secondary | ICD-10-CM

## 2014-11-21 DIAGNOSIS — O9989 Other specified diseases and conditions complicating pregnancy, childbirth and the puerperium: Principal | ICD-10-CM

## 2014-11-21 DIAGNOSIS — O09523 Supervision of elderly multigravida, third trimester: Secondary | ICD-10-CM

## 2014-11-21 LAB — POCT URINALYSIS DIPSTICK
Blood, UA: NEGATIVE
GLUCOSE UA: NEGATIVE
Ketones, UA: NEGATIVE
Nitrite, UA: NEGATIVE
Protein, UA: 2

## 2014-11-21 NOTE — Progress Notes (Signed)
US 36W tiwns  Baby A breech rt,BPP 8/8,post pl,limited view of cx ,RI .56 s/d 2.28,FHT 150BPM,AFI 5.5CM sdp                          Baby  B cephalic lt,BPP 8/8,FHT 151 bpm,afi sdp 5.1,RI .63,.68

## 2014-11-21 NOTE — Progress Notes (Signed)
Fetal Surveillance Testing today:  Sonogram is reviewed and read, all reassuring   High Risk Pregnancy Diagnosis(es):   Twins DiDi, gestational HTN, stable  G2P0101 6037w0d Estimated Date of Delivery: 12/19/14  Blood pressure 110/70, pulse 100, weight 222 lb (100.699 kg), last menstrual period 03/03/2014.  Urinalysis: Positive for 1+ protein   HPI: The patient is being seen today for ongoing management of twins gestatinal hypertension. Today she reports left hip pain, and back and musculoskeletal pain   BP weight and urine results all reviewed and noted. Patient reports good fetal movement, denies any bleeding and no rupture of membranes symptoms or regular contractions.  Fundal Height:  47 Fetal Heart rate:  145/150 Edema:  1+  Patient is without complaints other than noted in her HPI. All questions were answered.  All lab and sonogram results have been reviewed. Comments: normal   Assessment:  1.  Pregnancy at 7137w0d,  Estimated Date of Delivery: 12/19/14 :                          2.  Twins DiDi                        3.  Gestational Hypertension  Medication(s) Plans:  Labetalol 200 BID  Treatment Plan:  Breech A,   Follow up in tuesday weeks for appointment for high risk OB care, NST

## 2014-11-24 ENCOUNTER — Other Ambulatory Visit: Payer: Medicaid Other

## 2014-11-25 ENCOUNTER — Encounter: Payer: Self-pay | Admitting: Obstetrics & Gynecology

## 2014-11-25 ENCOUNTER — Telehealth: Payer: Self-pay | Admitting: *Deleted

## 2014-11-25 ENCOUNTER — Ambulatory Visit (INDEPENDENT_AMBULATORY_CARE_PROVIDER_SITE_OTHER): Payer: 59 | Admitting: Obstetrics & Gynecology

## 2014-11-25 VITALS — BP 112/80 | HR 80 | Wt 220.0 lb

## 2014-11-25 DIAGNOSIS — Z1389 Encounter for screening for other disorder: Secondary | ICD-10-CM

## 2014-11-25 DIAGNOSIS — O30049 Twin pregnancy, dichorionic/diamniotic, unspecified trimester: Secondary | ICD-10-CM

## 2014-11-25 DIAGNOSIS — O163 Unspecified maternal hypertension, third trimester: Secondary | ICD-10-CM

## 2014-11-25 DIAGNOSIS — O09893 Supervision of other high risk pregnancies, third trimester: Secondary | ICD-10-CM

## 2014-11-25 DIAGNOSIS — Z331 Pregnant state, incidental: Secondary | ICD-10-CM

## 2014-11-25 DIAGNOSIS — Z369 Encounter for antenatal screening, unspecified: Secondary | ICD-10-CM

## 2014-11-25 DIAGNOSIS — Z3685 Encounter for antenatal screening for Streptococcus B: Secondary | ICD-10-CM

## 2014-11-25 LAB — POCT URINALYSIS DIPSTICK
Blood, UA: 3
Glucose, UA: NEGATIVE
KETONES UA: NEGATIVE
Nitrite, UA: NEGATIVE
PROTEIN UA: 2

## 2014-11-25 MED ORDER — PRAMOXINE-HC-CHLOROXYLENOL 10-10-1 MG/ML OT SOLN: 2.0000 [drp] | Freq: Two times a day (BID) | OTIC | Status: DC

## 2014-11-25 NOTE — Telephone Encounter (Signed)
[facility] [place] states Pramoxine Dr. Despina Horn prescribed for pt has been discontinued. Per Dr. Wonda Horn Pamela Horn Otic Solution 2 drops in both ears BID, 1 bottle per verbal order.

## 2014-11-25 NOTE — Progress Notes (Signed)
Fetal Surveillance Testing today:  Reactive NST x 2, Twin B so active cannot even trace manually well   High Risk Pregnancy Diagnosis(es):   Twins, Gest Hypertension  G2P0101 6952w4d Estimated Date of Delivery: 12/19/14  Blood pressure 112/80, pulse 80, weight 220 lb (99.791 kg), last menstrual period 03/03/2014.  Urinalysis: Negative   HPI: The patient is being seen today for ongoing management of twins, gest hypertension. Today she reports no problems back pelvic pressure   BP weight and urine results all reviewed and noted. Patient reports good fetal movement, denies any bleeding and no rupture of membranes symptoms or regular contractions.  Fundal Height:  48   Fetal Heart rate:  140/145 Edema:  1+  Patient is without complaints other than noted in her HPI. All questions were answered.  All lab and sonogram results have been reviewed. Comments: abnormal:  Gest hypertension  Assessment:  1.  Pregnancy at 5652w4d,  Estimated Date of Delivery: 12/19/14 :                          2.  Twins DiDi                        3.  Gestational Hypertension  Medication(s) Plans:  Labetalol 100 BID  Treatment Plan:  No changes  Follow up in friday weeks for appointment for high risk OB care, NST

## 2014-11-26 LAB — GC/CHLAMYDIA PROBE AMP
Chlamydia trachomatis, NAA: NEGATIVE
Neisseria gonorrhoeae by PCR: NEGATIVE

## 2014-11-28 ENCOUNTER — Ambulatory Visit (INDEPENDENT_AMBULATORY_CARE_PROVIDER_SITE_OTHER): Payer: 59 | Admitting: Obstetrics & Gynecology

## 2014-11-28 VITALS — BP 112/80 | HR 80 | Wt 222.5 lb

## 2014-11-28 DIAGNOSIS — O09893 Supervision of other high risk pregnancies, third trimester: Secondary | ICD-10-CM

## 2014-11-28 DIAGNOSIS — O30049 Twin pregnancy, dichorionic/diamniotic, unspecified trimester: Secondary | ICD-10-CM | POA: Diagnosis not present

## 2014-11-28 DIAGNOSIS — O133 Gestational [pregnancy-induced] hypertension without significant proteinuria, third trimester: Secondary | ICD-10-CM | POA: Diagnosis not present

## 2014-11-28 DIAGNOSIS — Z1389 Encounter for screening for other disorder: Secondary | ICD-10-CM

## 2014-11-28 DIAGNOSIS — Z331 Pregnant state, incidental: Secondary | ICD-10-CM

## 2014-11-28 LAB — POCT URINALYSIS DIPSTICK
Blood, UA: NEGATIVE
Glucose, UA: NEGATIVE
Ketones, UA: NEGATIVE
Leukocytes, UA: NEGATIVE
NITRITE UA: NEGATIVE
Protein, UA: 2

## 2014-11-28 NOTE — Progress Notes (Signed)
Fetal Surveillance Testing today:  Reactive NST x 2   High Risk Pregnancy Diagnosis(es):   Twins, gestational Hypertension, previous CS, twin A breech  G2P0101 5323w0d Estimated Date of Delivery: 12/19/14  Blood pressure 112/80, pulse 80, weight 222 lb 8 oz (100.925 kg), last menstrual period 03/03/2014.  Urinalysis: Positive for 2+ protein   HPI: The patient is being seen today for ongoing management of twins, gestational hypertension. Today she reports pelvic pressure back ache   BP weight and urine results all reviewed and noted. Patient reports good fetal movement, denies any bleeding and no rupture of membranes symptoms or regular contractions.  Fundal Height:  48 Fetal Heart rate:  135/145 Edema:  1+  Patient is without complaints other than noted in her HPI. All questions were answered.  All lab and sonogram results have been reviewed. Comments: normal   Assessment:  1.  Pregnancy at 6323w0d,  Estimated Date of Delivery: 12/19/14 :                          2.  Twins, A breech                        3.  Gest HTN on labetalol  Medication(s) Plans:  No changes  Treatment Plan:  Repeat CS on 12/02/2014  Follow up in 1.5 weeks for appointment for high risk OB care, post op

## 2014-11-29 LAB — CULTURE, BETA STREP (GROUP B ONLY): STREP GP B CULTURE: NEGATIVE

## 2014-12-01 ENCOUNTER — Encounter (HOSPITAL_COMMUNITY)
Admission: RE | Admit: 2014-12-01 | Discharge: 2014-12-01 | Disposition: A | Discharge status: Home / Self Care | Payer: Medicaid Other | Source: Ambulatory Visit | Attending: Obstetrics & Gynecology | Admitting: Obstetrics & Gynecology

## 2014-12-01 ENCOUNTER — Encounter (HOSPITAL_COMMUNITY): Payer: Self-pay

## 2014-12-01 VITALS — BP 118/82 | HR 83 | Temp 97.9°F | Resp 24 | Ht 61.5 in | Wt 215.0 lb

## 2014-12-01 DIAGNOSIS — O09523 Supervision of elderly multigravida, third trimester: Secondary | ICD-10-CM

## 2014-12-01 DIAGNOSIS — O30043 Twin pregnancy, dichorionic/diamniotic, third trimester: Secondary | ICD-10-CM

## 2014-12-01 DIAGNOSIS — O9989 Other specified diseases and conditions complicating pregnancy, childbirth and the puerperium: Principal | ICD-10-CM

## 2014-12-01 DIAGNOSIS — O09293 Supervision of pregnancy with other poor reproductive or obstetric history, third trimester: Secondary | ICD-10-CM

## 2014-12-01 DIAGNOSIS — O09893 Supervision of other high risk pregnancies, third trimester: Secondary | ICD-10-CM

## 2014-12-01 DIAGNOSIS — R8271 Bacteriuria: Secondary | ICD-10-CM

## 2014-12-01 HISTORY — DX: Gestational (pregnancy-induced) hypertension without significant proteinuria, unspecified trimester: O13.9

## 2014-12-01 HISTORY — DX: Unspecified pre-eclampsia, unspecified trimester: O14.90

## 2014-12-01 LAB — URINALYSIS, ROUTINE W REFLEX MICROSCOPIC
Bilirubin Urine: NEGATIVE
Glucose, UA: NEGATIVE mg/dL
Ketones, ur: NEGATIVE mg/dL
Nitrite: NEGATIVE
Protein, ur: 30 mg/dL — AB
Specific Gravity, Urine: 1.025 (ref 1.005–1.030)
Urobilinogen, UA: 0.2 mg/dL (ref 0.0–1.0)
pH: 5.5 (ref 5.0–8.0)

## 2014-12-01 LAB — CBC
HCT: 33.5 % — ABNORMAL LOW (ref 36.0–46.0)
Hemoglobin: 11.2 g/dL — ABNORMAL LOW (ref 12.0–15.0)
MCH: 28.3 pg (ref 26.0–34.0)
MCHC: 33.4 g/dL (ref 30.0–36.0)
MCV: 84.6 fL (ref 78.0–100.0)
PLATELETS: 283 10*3/uL (ref 150–400)
RBC: 3.96 MIL/uL (ref 3.87–5.11)
RDW: 13.8 % (ref 11.5–15.5)
WBC: 10 10*3/uL (ref 4.0–10.5)

## 2014-12-01 LAB — URINE MICROSCOPIC-ADD ON

## 2014-12-01 LAB — ABO/RH: ABO/RH(D): B POS

## 2014-12-01 MED ORDER — DEXTROSE 5 % IV SOLN
2.0000 g | INTRAVENOUS | Status: AC
  Administered 2014-12-02: 2 g via INTRAVENOUS
  Filled 2014-12-01: qty 2

## 2014-12-01 NOTE — Patient Instructions (Signed)
Your procedure is scheduled on:  Tomorrow, Dec 02, 2014  [facility] at: 11:00 A.M.  Pick up the phone at the desk and dial [phone].  Call this number if you have problems the morning of surgery: [phone].  Remember: Do NOT eat food:  Midnight tonight Do NOT drink clear liquids after:  8:30 a.m. Tomorrow morning Take these medicines the morning of surgery with a SIP OF WATER:  Labetalol  Do NOT wear jewelry (body piercing), metal hair clips/bobby pins, or nail polish. Do NOT wear lotions, powders, or perfumes.  You may wear deoderant. Do NOT shave for 48 hours prior to surgery. Do NOT bring valuables to the hospital.  Leave suitcase in car.  After surgery it may be brought to your room.  For patients admitted to the hospital, checkout time is 11:00 AM the day of discharge.

## 2014-12-02 ENCOUNTER — Inpatient Hospital Stay (HOSPITAL_COMMUNITY): Payer: Medicaid Other | Admitting: Certified Registered Nurse Anesthetist

## 2014-12-02 ENCOUNTER — Inpatient Hospital Stay (HOSPITAL_COMMUNITY)
Admission: RE | Admit: 2014-12-02 | Discharge: 2014-12-05 | DRG: 765 | Disposition: A | Discharge status: Home / Self Care | Payer: Medicaid Other | Source: Ambulatory Visit | Attending: Obstetrics & Gynecology | Admitting: Obstetrics & Gynecology

## 2014-12-02 ENCOUNTER — Encounter (HOSPITAL_COMMUNITY): Payer: Self-pay | Admitting: *Deleted

## 2014-12-02 ENCOUNTER — Encounter (HOSPITAL_COMMUNITY)
Admission: RE | Disposition: A | Discharge status: Home / Self Care | Payer: Self-pay | Source: Ambulatory Visit | Attending: Obstetrics & Gynecology

## 2014-12-02 DIAGNOSIS — O99214 Obesity complicating childbirth: Secondary | ICD-10-CM | POA: Diagnosis present

## 2014-12-02 DIAGNOSIS — Z6841 Body Mass Index (BMI) 40.0 and over, adult: Secondary | ICD-10-CM

## 2014-12-02 DIAGNOSIS — O133 Gestational [pregnancy-induced] hypertension without significant proteinuria, third trimester: Secondary | ICD-10-CM | POA: Diagnosis present

## 2014-12-02 DIAGNOSIS — O3421 Maternal care for scar from previous cesarean delivery: Principal | ICD-10-CM | POA: Diagnosis present

## 2014-12-02 DIAGNOSIS — R8271 Bacteriuria: Secondary | ICD-10-CM

## 2014-12-02 DIAGNOSIS — Z3A37 37 weeks gestation of pregnancy: Secondary | ICD-10-CM | POA: Diagnosis present

## 2014-12-02 DIAGNOSIS — O30043 Twin pregnancy, dichorionic/diamniotic, third trimester: Secondary | ICD-10-CM | POA: Diagnosis present

## 2014-12-02 DIAGNOSIS — F17210 Nicotine dependence, cigarettes, uncomplicated: Secondary | ICD-10-CM | POA: Diagnosis present

## 2014-12-02 DIAGNOSIS — O321XX1 Maternal care for breech presentation, fetus 1: Secondary | ICD-10-CM | POA: Diagnosis present

## 2014-12-02 DIAGNOSIS — O09523 Supervision of elderly multigravida, third trimester: Secondary | ICD-10-CM | POA: Diagnosis not present

## 2014-12-02 DIAGNOSIS — O321XX0 Maternal care for breech presentation, not applicable or unspecified: Secondary | ICD-10-CM | POA: Diagnosis present

## 2014-12-02 DIAGNOSIS — O09293 Supervision of pregnancy with other poor reproductive or obstetric history, third trimester: Secondary | ICD-10-CM | POA: Diagnosis present

## 2014-12-02 DIAGNOSIS — O09893 Supervision of other high risk pregnancies, third trimester: Secondary | ICD-10-CM

## 2014-12-02 DIAGNOSIS — N858 Other specified noninflammatory disorders of uterus: Secondary | ICD-10-CM | POA: Diagnosis present

## 2014-12-02 DIAGNOSIS — K660 Peritoneal adhesions (postprocedural) (postinfection): Secondary | ICD-10-CM | POA: Diagnosis present

## 2014-12-02 DIAGNOSIS — O9989 Other specified diseases and conditions complicating pregnancy, childbirth and the puerperium: Secondary | ICD-10-CM

## 2014-12-02 DIAGNOSIS — O99334 Smoking (tobacco) complicating childbirth: Secondary | ICD-10-CM | POA: Diagnosis present

## 2014-12-02 DIAGNOSIS — O9962 Diseases of the digestive system complicating childbirth: Secondary | ICD-10-CM | POA: Diagnosis present

## 2014-12-02 DIAGNOSIS — Z98891 History of uterine scar from previous surgery: Secondary | ICD-10-CM

## 2014-12-02 DIAGNOSIS — O321XX2 Maternal care for breech presentation, fetus 2: Secondary | ICD-10-CM | POA: Diagnosis not present

## 2014-12-02 LAB — PREPARE RBC (CROSSMATCH)

## 2014-12-02 LAB — RPR: RPR: NONREACTIVE

## 2014-12-02 SURGERY — Surgical Case
Anesthesia: Spinal

## 2014-12-02 MED ORDER — KETOROLAC TROMETHAMINE 30 MG/ML IJ SOLN
30.0000 mg | Freq: Four times a day (QID) | INTRAMUSCULAR | Status: DC | PRN
  Administered 2014-12-02: 30 mg via INTRAMUSCULAR

## 2014-12-02 MED ORDER — PHENYLEPHRINE 8 MG IN D5W 100 ML (0.08MG/ML) PREMIX OPTIME
INJECTION | INTRAVENOUS | Status: DC | PRN
  Administered 2014-12-02: 60 ug/min via INTRAVENOUS

## 2014-12-02 MED ORDER — ONDANSETRON HCL 4 MG/2ML IJ SOLN: 4.0000 mg | Freq: Three times a day (TID) | INTRAMUSCULAR | Status: DC | PRN

## 2014-12-02 MED ORDER — BUPIVACAINE LIPOSOME 1.3 % IJ SUSP
INTRAMUSCULAR | Status: DC | PRN
  Administered 2014-12-02: 20 mL

## 2014-12-02 MED ORDER — NALBUPHINE HCL 10 MG/ML IJ SOLN: 5.0000 mg | INTRAMUSCULAR | Status: DC | PRN

## 2014-12-02 MED ORDER — SCOPOLAMINE 1 MG/3DAYS TD PT72
MEDICATED_PATCH | TRANSDERMAL | Status: AC
  Administered 2014-12-02: 1.5 mg via TRANSDERMAL
  Filled 2014-12-02: qty 1

## 2014-12-02 MED ORDER — PHENYLEPHRINE 40 MCG/ML (10ML) SYRINGE FOR IV PUSH (FOR BLOOD PRESSURE SUPPORT)
PREFILLED_SYRINGE | INTRAVENOUS | Status: AC
  Filled 2014-12-02: qty 10

## 2014-12-02 MED ORDER — ONDANSETRON HCL 4 MG/2ML IJ SOLN
INTRAMUSCULAR | Status: AC
  Filled 2014-12-02: qty 2

## 2014-12-02 MED ORDER — SIMETHICONE 80 MG PO CHEW
80.0000 mg | CHEWABLE_TABLET | ORAL | Status: DC
  Administered 2014-12-03 – 2014-12-04 (×4): 80 mg via ORAL
  Filled 2014-12-02 (×3): qty 1

## 2014-12-02 MED ORDER — SIMETHICONE 80 MG PO CHEW
80.0000 mg | CHEWABLE_TABLET | Freq: Three times a day (TID) | ORAL | Status: DC
  Administered 2014-12-02 – 2014-12-05 (×5): 80 mg via ORAL
  Filled 2014-12-02 (×7): qty 1

## 2014-12-02 MED ORDER — SODIUM CHLORIDE 0.9 % IJ SOLN
INTRAMUSCULAR | Status: AC
  Filled 2014-12-02: qty 50

## 2014-12-02 MED ORDER — BUPIVACAINE LIPOSOME 1.3 % IJ SUSP: 20.0000 mL | Freq: Once | INTRAMUSCULAR | Status: DC

## 2014-12-02 MED ORDER — ACETAMINOPHEN 325 MG PO TABS
650.0000 mg | ORAL_TABLET | ORAL | Status: DC | PRN
  Administered 2014-12-02: 650 mg via ORAL
  Filled 2014-12-02: qty 2

## 2014-12-02 MED ORDER — PRENATAL MULTIVITAMIN CH
1.0000 | ORAL_TABLET | Freq: Every day | ORAL | Status: DC
  Administered 2014-12-03 – 2014-12-05 (×3): 1 via ORAL
  Filled 2014-12-02 (×3): qty 1

## 2014-12-02 MED ORDER — NALBUPHINE HCL 10 MG/ML IJ SOLN: 5.0000 mg | Freq: Once | INTRAMUSCULAR | Status: AC | PRN

## 2014-12-02 MED ORDER — OXYTOCIN 40 UNITS IN LACTATED RINGERS INFUSION - SIMPLE MED: 62.5000 mL/h | INTRAVENOUS | Status: AC

## 2014-12-02 MED ORDER — DIBUCAINE 1 % RE OINT
1.0000 | TOPICAL_OINTMENT | RECTAL | Status: DC | PRN
Start: 2014-12-02 — End: 2014-12-05

## 2014-12-02 MED ORDER — MORPHINE SULFATE (PF) 0.5 MG/ML IJ SOLN
INTRAMUSCULAR | Status: DC | PRN
  Administered 2014-12-02: .15 mg via EPIDURAL

## 2014-12-02 MED ORDER — SODIUM CHLORIDE 0.9 % IJ SOLN: 3.0000 mL | INTRAMUSCULAR | Status: DC | PRN

## 2014-12-02 MED ORDER — IBUPROFEN 600 MG PO TABS
600.0000 mg | ORAL_TABLET | Freq: Four times a day (QID) | ORAL | Status: DC
  Administered 2014-12-02 – 2014-12-05 (×11): 600 mg via ORAL
  Filled 2014-12-02 (×11): qty 1

## 2014-12-02 MED ORDER — FENTANYL CITRATE (PF) 100 MCG/2ML IJ SOLN
INTRAMUSCULAR | Status: AC
  Filled 2014-12-02: qty 2

## 2014-12-02 MED ORDER — WITCH HAZEL-GLYCERIN EX PADS
1.0000 | MEDICATED_PAD | CUTANEOUS | Status: DC | PRN
Start: 2014-12-02 — End: 2014-12-05

## 2014-12-02 MED ORDER — BUPIVACAINE IN DEXTROSE 0.75-8.25 % IT SOLN
INTRATHECAL | Status: DC | PRN
  Administered 2014-12-02: 1.4 mL via INTRATHECAL

## 2014-12-02 MED ORDER — DIPHENHYDRAMINE HCL 25 MG PO CAPS: 25.0000 mg | ORAL_CAPSULE | ORAL | Status: DC | PRN

## 2014-12-02 MED ORDER — ZOLPIDEM TARTRATE 5 MG PO TABS: 5.0000 mg | ORAL_TABLET | Freq: Every evening | ORAL | Status: DC | PRN

## 2014-12-02 MED ORDER — OXYCODONE-ACETAMINOPHEN 5-325 MG PO TABS
2.0000 | ORAL_TABLET | ORAL | Status: DC | PRN
  Administered 2014-12-03 – 2014-12-05 (×10): 2 via ORAL
  Filled 2014-12-02 (×8): qty 2

## 2014-12-02 MED ORDER — LACTATED RINGERS IV SOLN
INTRAVENOUS | Status: DC
  Administered 2014-12-02 (×2): via INTRAVENOUS

## 2014-12-02 MED ORDER — OXYTOCIN 10 UNIT/ML IJ SOLN
40.0000 [IU] | INTRAVENOUS | Status: DC | PRN
  Administered 2014-12-02: 40 [IU] via INTRAVENOUS

## 2014-12-02 MED ORDER — OXYTOCIN 10 UNIT/ML IJ SOLN
INTRAMUSCULAR | Status: AC
  Filled 2014-12-02: qty 4

## 2014-12-02 MED ORDER — PHENYLEPHRINE 8 MG IN D5W 100 ML (0.08MG/ML) PREMIX OPTIME
INJECTION | INTRAVENOUS | Status: AC
  Filled 2014-12-02: qty 100

## 2014-12-02 MED ORDER — SENNOSIDES-DOCUSATE SODIUM 8.6-50 MG PO TABS
2.0000 | ORAL_TABLET | ORAL | Status: DC
  Administered 2014-12-03 – 2014-12-04 (×3): 2 via ORAL
  Filled 2014-12-02 (×3): qty 2

## 2014-12-02 MED ORDER — NALOXONE HCL 1 MG/ML IJ SOLN: 1.0000 ug/kg/h | INTRAVENOUS | Status: DC | PRN

## 2014-12-02 MED ORDER — PHENYLEPHRINE HCL 10 MG/ML IJ SOLN
INTRAMUSCULAR | Status: DC | PRN
  Administered 2014-12-02: 40 ug via INTRAVENOUS
  Administered 2014-12-02: 80 ug via INTRAVENOUS

## 2014-12-02 MED ORDER — DIPHENHYDRAMINE HCL 50 MG/ML IJ SOLN: 12.5000 mg | INTRAMUSCULAR | Status: DC | PRN

## 2014-12-02 MED ORDER — MEPERIDINE HCL 25 MG/ML IJ SOLN: 6.2500 mg | INTRAMUSCULAR | Status: DC | PRN

## 2014-12-02 MED ORDER — TETANUS-DIPHTH-ACELL PERTUSSIS 5-2.5-18.5 LF-MCG/0.5 IM SUSP: 0.5000 mL | Freq: Once | INTRAMUSCULAR | Status: DC

## 2014-12-02 MED ORDER — SIMETHICONE 80 MG PO CHEW: 80.0000 mg | CHEWABLE_TABLET | ORAL | Status: DC | PRN

## 2014-12-02 MED ORDER — NALOXONE HCL 0.4 MG/ML IJ SOLN: 0.4000 mg | INTRAMUSCULAR | Status: DC | PRN

## 2014-12-02 MED ORDER — ONDANSETRON HCL 4 MG/2ML IJ SOLN
INTRAMUSCULAR | Status: DC | PRN
  Administered 2014-12-02: 4 mg via INTRAVENOUS

## 2014-12-02 MED ORDER — MENTHOL 3 MG MT LOZG: 1.0000 | LOZENGE | OROMUCOSAL | Status: DC | PRN

## 2014-12-02 MED ORDER — KETOROLAC TROMETHAMINE 30 MG/ML IJ SOLN: 30.0000 mg | Freq: Four times a day (QID) | INTRAMUSCULAR | Status: DC | PRN

## 2014-12-02 MED ORDER — LACTATED RINGERS IV SOLN
INTRAVENOUS | Status: DC | PRN
  Administered 2014-12-02: 14:00:00 via INTRAVENOUS

## 2014-12-02 MED ORDER — FENTANYL CITRATE (PF) 100 MCG/2ML IJ SOLN
INTRAMUSCULAR | Status: DC | PRN
  Administered 2014-12-02: 25 ug via INTRAVENOUS

## 2014-12-02 MED ORDER — SCOPOLAMINE 1 MG/3DAYS TD PT72: 1.0000 | MEDICATED_PATCH | Freq: Once | TRANSDERMAL | Status: DC

## 2014-12-02 MED ORDER — OXYCODONE-ACETAMINOPHEN 5-325 MG PO TABS
1.0000 | ORAL_TABLET | ORAL | Status: DC | PRN
  Administered 2014-12-03: 1 via ORAL
  Filled 2014-12-02 (×6): qty 1

## 2014-12-02 MED ORDER — FENTANYL CITRATE (PF) 100 MCG/2ML IJ SOLN: 25.0000 ug | INTRAMUSCULAR | Status: DC | PRN

## 2014-12-02 MED ORDER — MORPHINE SULFATE 0.5 MG/ML IJ SOLN
INTRAMUSCULAR | Status: AC
  Filled 2014-12-02: qty 10

## 2014-12-02 MED ORDER — BUPIVACAINE LIPOSOME 1.3 % IJ SUSP
20.0000 mL | Freq: Once | INTRAMUSCULAR | Status: DC
  Filled 2014-12-02: qty 20

## 2014-12-02 MED ORDER — SODIUM CHLORIDE 0.9 % IJ SOLN
INTRAMUSCULAR | Status: DC | PRN
  Administered 2014-12-02: 40 mL via INTRAVENOUS

## 2014-12-02 MED ORDER — PROMETHAZINE HCL 25 MG/ML IJ SOLN
6.2500 mg | INTRAMUSCULAR | Status: DC | PRN
Start: 2014-12-02 — End: 2014-12-02

## 2014-12-02 MED ORDER — SCOPOLAMINE 1 MG/3DAYS TD PT72
1.0000 | MEDICATED_PATCH | Freq: Once | TRANSDERMAL | Status: DC
  Administered 2014-12-02: 1.5 mg via TRANSDERMAL

## 2014-12-02 MED ORDER — LANOLIN HYDROUS EX OINT
1.0000 | TOPICAL_OINTMENT | CUTANEOUS | Status: DC | PRN
Start: 2014-12-02 — End: 2014-12-05

## 2014-12-02 MED ORDER — KETOROLAC TROMETHAMINE 30 MG/ML IJ SOLN
INTRAMUSCULAR | Status: AC
  Administered 2014-12-02: 30 mg via INTRAMUSCULAR
  Filled 2014-12-02: qty 1

## 2014-12-02 MED ORDER — LACTATED RINGERS IV SOLN
INTRAVENOUS | Status: DC
  Administered 2014-12-02: 20:00:00 via INTRAVENOUS

## 2014-12-02 MED ORDER — DIPHENHYDRAMINE HCL 25 MG PO CAPS: 25.0000 mg | ORAL_CAPSULE | Freq: Four times a day (QID) | ORAL | Status: DC | PRN

## 2014-12-02 SURGICAL SUPPLY — 37 items
CLAMP CORD UMBIL (MISCELLANEOUS) IMPLANT
CLOTH BEACON ORANGE TIMEOUT ST (SAFETY) ×3 IMPLANT
DRAPE SHEET LG 3/4 BI-LAMINATE (DRAPES) IMPLANT
DRSG OPSITE POSTOP 4X10 (GAUZE/BANDAGES/DRESSINGS) ×3 IMPLANT
DRSG OPSITE POSTOP 4X12 (GAUZE/BANDAGES/DRESSINGS) ×2 IMPLANT
DURAPREP 26ML APPLICATOR (WOUND CARE) ×6 IMPLANT
ELECT REM PT RETURN 9FT ADLT (ELECTROSURGICAL) ×3
ELECTRODE REM PT RTRN 9FT ADLT (ELECTROSURGICAL) ×1 IMPLANT
EXTRACTOR VACUUM BELL STYLE (SUCTIONS) IMPLANT
GLOVE BIOGEL PI IND STRL 8 (GLOVE) ×1 IMPLANT
GLOVE BIOGEL PI INDICATOR 8 (GLOVE) ×2
GLOVE ECLIPSE 8.0 STRL XLNG CF (GLOVE) ×3 IMPLANT
GOWN STRL REUS W/TWL LRG LVL3 (GOWN DISPOSABLE) ×6 IMPLANT
KIT ABG SYR 3ML LUER SLIP (SYRINGE) ×3 IMPLANT
LIQUID BAND (GAUZE/BANDAGES/DRESSINGS) ×4 IMPLANT
NDL HYPO 18GX1.5 BLUNT FILL (NEEDLE) ×1 IMPLANT
NDL HYPO 25X5/8 SAFETYGLIDE (NEEDLE) ×1 IMPLANT
NEEDLE HYPO 18GX1.5 BLUNT FILL (NEEDLE) ×3 IMPLANT
NEEDLE HYPO 22GX1.5 SAFETY (NEEDLE) ×3 IMPLANT
NEEDLE HYPO 25X5/8 SAFETYGLIDE (NEEDLE) ×3 IMPLANT
NS IRRIG 1000ML POUR BTL (IV SOLUTION) ×3 IMPLANT
PACK C SECTION WH (CUSTOM PROCEDURE TRAY) ×3 IMPLANT
PAD OB MATERNITY 4.3X12.25 (PERSONAL CARE ITEMS) ×3 IMPLANT
RTRCTR C-SECT PINK 25CM LRG (MISCELLANEOUS) IMPLANT
STAPLER VISISTAT 35W (STAPLE) IMPLANT
SUT CHROMIC 0 CT 1 (SUTURE) ×3 IMPLANT
SUT MNCRL 0 VIOLET CTX 36 (SUTURE) ×2 IMPLANT
SUT MONOCRYL 0 CTX 36 (SUTURE) ×2
SUT PLAIN 2 0 (SUTURE)
SUT PLAIN 2 0 XLH (SUTURE) IMPLANT
SUT PLAIN ABS 2-0 CT1 27XMFL (SUTURE) IMPLANT
SUT VIC AB 0 CTX 36 (SUTURE) ×3
SUT VIC AB 0 CTX36XBRD ANBCTRL (SUTURE) ×1 IMPLANT
SUT VIC AB 4-0 KS 27 (SUTURE) IMPLANT
SYR 20CC LL (SYRINGE) ×6 IMPLANT
TOWEL OR 17X24 6PK STRL BLUE (TOWEL DISPOSABLE) ×3 IMPLANT
TRAY FOLEY CATH SILVER 14FR (SET/KITS/TRAYS/PACK) IMPLANT

## 2014-12-02 NOTE — H&P (Signed)
Preoperative History and Physical  Pamela Horn is a 34 y.o. [redacted]w[redacted]d G2P0101 with Patient's last menstrual period was 03/03/2014 (within weeks). admitted for a repeat Caesarean section.  Twins DiDi twin A breech, previous Caesarean section declines TOL, gestational hypertension on labetalol 100 BID, 24 hour urine < 300 mg/24 hours. Occasional spot Pr/Cr have been elevated but stable.  Still designating as a gestational hypertensive  PMH:    Past Medical History  Diagnosis Date  . Pregnancy induced hypertension   . Pre-eclampsia     1st pregnancy    PSH:     Past Surgical History  Procedure Laterality Date  . Colposcopy  04/04/2000 & 10/03/2000  . Gynecologic cryosurgery  11/07/2000  . Cesarean section  10/18/1999    POb/GynH:      OB History    Gravida Para Term Preterm AB TAB SAB Ectopic Multiple Living   2 1  1      1       SH:   History  Substance Use Topics  . Smoking status: Current Every Day Smoker -- 1.00 packs/day for 20 years    Types: Cigarettes  . Smokeless tobacco: Never Used  . Alcohol Use: No    FH:    Family History  Problem Relation Age of Onset  . Hypertension Mother   . Cancer Father     melanoma  . Cancer Sister   . Diabetes Paternal Grandfather      Allergies: No Known Allergies  Medications:       Current facility-administered medications:  .  bupivacaine liposome (EXPAREL) 1.3 % injection 266 mg, 20 mL, Infiltration, Once, Lazaro Arms, MD .  cefoTEtan (CEFOTAN) 2 g in dextrose 5 % 50 mL IVPB, 2 g, Intravenous, On Call to OR, Lazaro Arms, MD .  lactated ringers infusion, , Intravenous, Continuous, Mal Amabile, MD, Last Rate: 125 mL/hr at 12/02/14 1121 .  scopolamine (TRANSDERM-SCOP) 1 MG/3DAYS 1.5 mg, 1 patch, Transdermal, Once, Mal Amabile, MD, 1.5 mg at 12/02/14 1121  Review of Systems:   Review of Systems  Constitutional: Negative for fever, chills, weight loss, malaise/fatigue and diaphoresis.  HENT: Negative for hearing  loss, ear pain, nosebleeds, congestion, sore throat, neck pain, tinnitus and ear discharge.   Eyes: Negative for blurred vision, double vision, photophobia, pain, discharge and redness.  Respiratory: Negative for cough, hemoptysis, sputum production, shortness of breath, wheezing and stridor.   Cardiovascular: Negative for chest pain, palpitations, orthopnea, claudication, leg swelling and PND.  Gastrointestinal: Positive for abdominal pain. Negative for heartburn, nausea, vomiting, diarrhea, constipation, blood in stool and melena.  Genitourinary: Negative for dysuria, urgency, frequency, hematuria and flank pain.  Musculoskeletal: Negative for myalgias, back pain, joint pain and falls.  Skin: Negative for itching and rash.  Neurological: Negative for dizziness, tingling, tremors, sensory change, speech change, focal weakness, seizures, loss of consciousness, weakness and headaches.  Endo/Heme/Allergies: Negative for environmental allergies and polydipsia. Does not bruise/bleed easily.  Psychiatric/Behavioral: Negative for depression, suicidal ideas, hallucinations, memory loss and substance abuse. The patient is not nervous/anxious and does not have insomnia.      PHYSICAL EXAM:  Blood pressure 135/95, pulse 87, temperature 97.9 F (36.6 C), temperature source Oral, resp. rate 18, last menstrual period 03/03/2014, SpO2 100 %.    Vitals reviewed. Constitutional: She is oriented to person, place, and time. She appears well-developed and well-nourished.  HENT:  Head: Normocephalic and atraumatic.  Right Ear: External ear normal.  Left Ear: External ear normal.  Nose: Nose normal.  Mouth/Throat: Oropharynx is clear and moist.  Eyes: Conjunctivae and EOM are normal. Pupils are equal, round, and reactive to light. Right eye exhibits no discharge. Left eye exhibits no discharge. No scleral icterus.  Neck: Normal range of motion. Neck supple. No tracheal deviation present. No thyromegaly  present.  Cardiovascular: Normal rate, regular rhythm, normal heart sounds and intact distal pulses.  Exam reveals no gallop and no friction rub.   No murmur heard. Respiratory: Effort normal and breath sounds normal. No respiratory distress. She has no wheezes. She has no rales. She exhibits no tenderness.  GI: Soft. Bowel sounds are normal. She exhibits no distension and no mass. There is tenderness. There is no rebound and no guarding.  Genitourinary:       Vulva is normal without lesions Vagina is pink moist without discharge Cervix unexamined Uterus is FH 48 consistent with twins Adnexa is negative with normal sized ovaries by sonogram  Musculoskeletal: Normal range of motion. She exhibits no edema and no tenderness.  Neurological: She is alert and oriented to person, place, and time. She has normal reflexes. She displays normal reflexes. No cranial nerve deficit. She exhibits normal muscle tone. Coordination normal.  Skin: Skin is warm and dry. No rash noted. No erythema. No pallor.  Psychiatric: She has a normal mood and affect. Her behavior is normal. Judgment and thought content normal.    Labs: Results for orders placed or performed during the hospital encounter of 12/02/14 (from the past 336 hour(s))  ABO/Rh   Collection Time: 12/01/14  2:10 PM  Result Value Ref Range   ABO/RH(D) B POS   Prepare RBC (crossmatch)   Collection Time: 12/02/14 11:00 AM  Result Value Ref Range   Order Confirmation ORDER PROCESSED BY BLOOD BANK   Results for orders placed or performed during the hospital encounter of 12/01/14 (from the past 336 hour(s))  Urinalysis, Routine w reflex microscopic   Collection Time: 12/01/14  2:05 PM  Result Value Ref Range   Color, Urine YELLOW YELLOW   APPearance HAZY (A) CLEAR   Specific Gravity, Urine 1.025 1.005 - 1.030   pH 5.5 5.0 - 8.0   Glucose, UA NEGATIVE NEGATIVE mg/dL   Hgb urine dipstick TRACE (A) NEGATIVE   Bilirubin Urine NEGATIVE NEGATIVE    Ketones, ur NEGATIVE NEGATIVE mg/dL   Protein, ur 30 (A) NEGATIVE mg/dL   Urobilinogen, UA 0.2 0.0 - 1.0 mg/dL   Nitrite NEGATIVE NEGATIVE   Leukocytes, UA LARGE (A) NEGATIVE  Urine microscopic-add on   Collection Time: 12/01/14  2:05 PM  Result Value Ref Range   Squamous Epithelial / LPF FEW (A) RARE   WBC, UA 11-20 <3 WBC/hpf   RBC / HPF 0-2 <3 RBC/hpf   Bacteria, UA FEW (A) RARE  CBC   Collection Time: 12/01/14  2:10 PM  Result Value Ref Range   WBC 10.0 4.0 - 10.5 K/uL   RBC 3.96 3.87 - 5.11 MIL/uL   Hemoglobin 11.2 (L) 12.0 - 15.0 g/dL   HCT 81.1 (L) 91.4 - 78.2 %   MCV 84.6 78.0 - 100.0 fL   MCH 28.3 26.0 - 34.0 pg   MCHC 33.4 30.0 - 36.0 g/dL   RDW 95.6 21.3 - 08.6 %   Platelets 283 150 - 400 K/uL  RPR   Collection Time: 12/01/14  2:10 PM  Result Value Ref Range   RPR Ser Ql Non Reactive Non Reactive  Type and screen   Collection Time:  12/01/14  2:10 PM  Result Value Ref Range   ABO/RH(D) B POS    Antibody Screen NEG    Sample Expiration 12/04/2014    Unit Number [id number]    Blood Component Type RBC LR PHER2    Unit division 00    Status of Unit ALLOCATED    Transfusion Status OK TO TRANSFUSE    Crossmatch Result Compatible    Unit Number [id number]    Blood Component Type RED CELLS,LR    Unit division 00    Status of Unit ALLOCATED    Transfusion Status OK TO TRANSFUSE    Crossmatch Result Compatible   Results for orders placed or performed in visit on 11/28/14 (from the past 336 hour(s))  POCT urinalysis dipstick   Collection Time: 11/28/14 11:01 AM  Result Value Ref Range   Color, UA yellow    Clarity, UA clear    Glucose, UA neg    Bilirubin, UA     Ketones, UA neg    Spec Grav, UA     Blood, UA neg    pH, UA     Protein, UA 2    Urobilinogen, UA     Nitrite, UA neg    Leukocytes, UA Negative   Results for orders placed or performed in visit on 11/25/14 (from the past 336 hour(s))  POCT urinalysis dipstick   Collection Time: 11/25/14   4:12 PM  Result Value Ref Range   Color, UA     Clarity, UA     Glucose, UA neg    Bilirubin, UA     Ketones, UA neg    Spec Grav, UA     Blood, UA 3    pH, UA     Protein, UA 2    Urobilinogen, UA     Nitrite, UA neg    Leukocytes, UA small (1+)   GC/Chlamydia Probe Amp   Collection Time: 11/25/14  4:18 PM  Result Value Ref Range   Chlamydia trachomatis, NAA Negative Negative   Neisseria gonorrhoeae by PCR Negative Negative   PLEASE NOTE: Comment   Culture, beta strep (group b only)   Collection Time: 11/25/14  4:19 PM  Result Value Ref Range   Strep Gp B Culture Negative Negative  Results for orders placed or performed in visit on 11/21/14 (from the past 336 hour(s))  POCT urinalysis dipstick   Collection Time: 11/21/14  1:39 PM  Result Value Ref Range   Color, UA     Clarity, UA     Glucose, UA neg    Bilirubin, UA     Ketones, UA neg    Spec Grav, UA     Blood, UA neg    pH, UA     Protein, UA 2    Urobilinogen, UA     Nitrite, UA neg    Leukocytes, UA small (1+)   Results for orders placed or performed in visit on 11/18/14 (from the past 336 hour(s))  POCT urinalysis dipstick   Collection Time: 11/18/14  4:15 PM  Result Value Ref Range   Color, UA     Clarity, UA     Glucose, UA neg    Bilirubin, UA     Ketones, UA neg    Spec Grav, UA     Blood, UA small    pH, UA     Protein, UA neg    Urobilinogen, UA     Nitrite, UA neg    Leukocytes, UA  moderate (2+)   Protein / Creatinine Ratio, Urine   Collection Time: 11/18/14  4:40 PM  Result Value Ref Range   Creatinine, Urine 257.9 Not Estab. mg/dL   Protein, Ur 161.0 Not Estab. mg/dL   Protein/Creat Ratio 960 (H) 0 - 200 mg/g creat    EKG: No orders found for this or any previous visit.  Imaging Studies: US Fetal Bpp W/o Non Stress  11/26/2014   TWINS FOLLOW UP SONOGRAM   KEYUNA CUTHRELL is in the office for a follow up sonogram of a twin  gestation.  She is a 34 y.o. year old G43P0101 with Estimated  Date of Delivery: 12/19/14   now at  [redacted]w[redacted]d weeks gestation. Thus far the pregnancy has been otherwise  complicated by AMA, and twins..   The twins are dichorionic/diamniotic.   TWIN A GESTATION: twin  PRESENTATION: breech rt  FETAL ACTIVITY:          Heart rate         149bpm          The fetus is active.  AMNIOTIC FLUID: The amniotic fluid volume is  normal, 5.5 cm.SDP  PLACENTA LOCALIZATION:  posterior GRADE 1  CERVIX: Limited view  ADNEXA: Limited view   GESTATIONAL AGE AND  BIOMETRICS:  Gestational criteria: Estimated Date of Delivery: 12/19/14  now at [redacted]w[redacted]d  BIOPHYSCIAL PROFILE:                                                                                                       COMMENTS GROSS BODY MOVEMENT                 2   TONE                2   RESPIRATIONS                2   AMNIOTIC FLUID                2                                                            SCORE:  8/8 (Note: NST was not performed as part of this antepartum testing)  DOPPLER FLOW STUDIES: UMBILICAL ARTERY RI RATIOS:   0.56, 0.56   Previous Scans:5                                    ASUSPECTED ABNORMALITIES:  no  QUALITY OF SCAN: Satisfactory    TWIN B  GESTATION: twin  PRESENTATION: Cephalic rt  FETAL ACTIVITY:          Heart rate         151bpm          The fetus is active.  AMNIOTIC  FLUID: The amniotic fluid volume is  normal, 5.1 cm.  PLACENTA LOCALIZATION:  posterior GRADE 1  CERVIX: Limited view  ADNEXA: Limited view BIOPHYSCIAL PROFILE:                                                                                                       COMMENTS GROSS BODY MOVEMENT                 2   TONE                2   RESPIRATIONS                2   AMNIOTIC FLUID                2                                                            SCORE:  8/8 (Note: NST was not performed as part of this antepartum testing)       SUSPECTED ABNORMALITIES:  no  QUALITY OF SCAN: Satisfactory      TECHNICIAN COMMENTS:  US 36W tiwns Baby A breech  rt,BPP 8/8,post pl,limited view of cx ,RI .56  s/d 2.28,FHT 150BPM,AFI 5.5CM sdp  Baby B cephalic lt,BPP 8/8,FHT 151 bpm,afi sdp  5.1,RI .63,.68  A copy of this report including all images has been saved and backed up to  a second source for retrieval if needed. All measures and details of the  anatomical scan, placentation, fluid volume and pelvic anatomy are  contained in that report.  Amber Flora LippsJ Carl 11/21/2014 2:16 PM  Clinical Impression and recommendations:  I have reviewed the sonogram results above, combined with the patient's  current clinical course, below are my impressions and any appropriate  recommendations for management based on the sonographic findings.   1. Twins DiDi [id number] Estimated Date of Delivery: 12/19/14 by serial  sonographic evaluations   2.  Fetal sonographic surveillance findings:   a). Normal fluid volume b). Normal antepartum fetal assessment with BPP 8/8 x 2 c). Normal fetal Doppler ratios with consistent diastolic flow x2  3.  Normal general sonographic findings  Recommend continued prenatal evaluations and care based on this sonogram  and as clinically indicated from the patient's clinical course.  Lazaro ArmsURE,LUTHER H 11/26/2014 6:27 PM    [place] Ua Addl Gest  11/26/2014   TWINS FOLLOW UP SONOGRAM   Pamela Horn is in the office for a follow up sonogram of a twin  gestation.  She is a [age] y.o. year old 662P0101 with Estimated Date of Delivery: 12/19/14   now at  4328w0d weeks gestation. Thus far the pregnancy has been otherwise  complicated by AMA, and twins..   The twins are dichorionic/diamniotic.   TWIN A GESTATION: twin  PRESENTATION: breech rt  FETAL ACTIVITY:  Heart rate         149bpm          The fetus is active.  AMNIOTIC FLUID: The amniotic fluid volume is  normal, 5.5 cm.SDP  PLACENTA LOCALIZATION:  posterior GRADE 1  CERVIX: Limited view  ADNEXA: Limited view   GESTATIONAL AGE AND  BIOMETRICS:  Gestational criteria: Estimated Date of Delivery: 12/19/14   now at [redacted]w[redacted]d  BIOPHYSCIAL PROFILE:                                                                                                       COMMENTS GROSS BODY MOVEMENT                 2   TONE                2   RESPIRATIONS                2   AMNIOTIC FLUID                2                                                            SCORE:  8/8 (Note: NST was not performed as part of this antepartum testing)  DOPPLER FLOW STUDIES: UMBILICAL ARTERY RI RATIOS:   0.56, 0.56   Previous Scans:5                                    ASUSPECTED ABNORMALITIES:  no  QUALITY OF SCAN: Satisfactory    TWIN B  GESTATION: twin  PRESENTATION: Cephalic rt  FETAL ACTIVITY:          Heart rate         151bpm          The fetus is active.  AMNIOTIC FLUID: The amniotic fluid volume is  normal, 5.1 cm.  PLACENTA LOCALIZATION:  posterior GRADE 1  CERVIX: Limited view  ADNEXA: Limited view BIOPHYSCIAL PROFILE:                                                                                                       COMMENTS GROSS BODY MOVEMENT                 2   TONE  2   RESPIRATIONS                2   AMNIOTIC FLUID                2                                                            SCORE:  8/8 (Note: NST was not performed as part of this antepartum testing)       SUSPECTED ABNORMALITIES:  no  QUALITY OF SCAN: Satisfactory      TECHNICIAN COMMENTS:  [place] 36W tiwns Baby A breech rt,BPP 8/8,post pl,limited view of cx ,RI .56  s/d 2.28,FHT 150BPM,AFI 5.5CM sdp  Baby B cephalic lt,BPP 8/8,FHT 151 bpm,afi sdp  5.1,RI .63,.68  A copy of this report including all images has been saved and backed up to  a second source for retrieval if needed. All measures and details of the  anatomical scan, placentation, fluid volume and pelvic anatomy are  contained in that report.  Amber Flora Lipps 11/21/2014 2:16 PM  Clinical Impression and recommendations:  I have reviewed the sonogram results above, combined with the  patient's  current clinical course, below are my impressions and any appropriate  recommendations for management based on the sonographic findings.   1. Twins DiDi [id number] Estimated Date of Delivery: 12/19/14 by serial  sonographic evaluations   2.  Fetal sonographic surveillance findings:   a). Normal fluid volume b). Normal antepartum fetal assessment with BPP 8/8 x 2 c). Normal fetal Doppler ratios with consistent diastolic flow x2  3.  Normal general sonographic findings  Recommend continued prenatal evaluations and care based on this sonogram  and as clinically indicated from the patient's clinical course.  Lazaro Arms 11/26/2014 6:27 PM    [place] Ua Cord Doppler  11/26/2014   TWINS FOLLOW UP SONOGRAM   Pamela Horn is in the office for a follow up sonogram of a twin  gestation.  She is a 34 y.o. year old G76P0101 with Estimated Date of Delivery: 12/19/14   now at  [redacted]w[redacted]d weeks gestation. Thus far the pregnancy has been otherwise  complicated by AMA, and twins..   The twins are dichorionic/diamniotic.   TWIN A GESTATION: twin  PRESENTATION: breech rt  FETAL ACTIVITY:          Heart rate         149bpm          The fetus is active.  AMNIOTIC FLUID: The amniotic fluid volume is  normal, 5.5 cm.SDP  PLACENTA LOCALIZATION:  posterior GRADE 1  CERVIX: Limited view  ADNEXA: Limited view   GESTATIONAL AGE AND  BIOMETRICS:  Gestational criteria: Estimated Date of Delivery: 12/19/14  now at [redacted]w[redacted]d  BIOPHYSCIAL PROFILE:  COMMENTS GROSS BODY MOVEMENT                 2   TONE                2   RESPIRATIONS                2   AMNIOTIC FLUID                2                                                            SCORE:  8/8 (Note: NST was not performed as part of this antepartum testing)  DOPPLER FLOW STUDIES: UMBILICAL ARTERY RI RATIOS:   0.56, 0.56   Previous Scans:5                                    ASUSPECTED  ABNORMALITIES:  no  QUALITY OF SCAN: Satisfactory    TWIN B  GESTATION: twin  PRESENTATION: Cephalic rt  FETAL ACTIVITY:          Heart rate         151bpm          The fetus is active.  AMNIOTIC FLUID: The amniotic fluid volume is  normal, 5.1 cm.  PLACENTA LOCALIZATION:  posterior GRADE 1  CERVIX: Limited view  ADNEXA: Limited view BIOPHYSCIAL PROFILE:                                                                                                       COMMENTS GROSS BODY MOVEMENT                 2   TONE                2   RESPIRATIONS                2   AMNIOTIC FLUID                2                                                            SCORE:  8/8 (Note: NST was not performed as part of this antepartum testing)       SUSPECTED ABNORMALITIES:  no  QUALITY OF SCAN: Satisfactory      TECHNICIAN COMMENTS:  [place] 36W tiwns Baby A breech rt,BPP 8/8,post pl,limited view of cx ,RI .56  s/d 2.28,FHT 150BPM,AFI 5.5CM sdp  Baby B cephalic lt,BPP 8/8,FHT 151 bpm,afi sdp  5.1,RI .63,.68  A copy of this report including all images has been saved and backed up to  a second source for retrieval if needed. All measures and details of the  anatomical scan, placentation, fluid volume and pelvic anatomy are  contained in that report.  Amber Flora Lipps 11/21/2014 2:16 PM  Clinical Impression and recommendations:  I have reviewed the sonogram results above, combined with the patient's  current clinical course, below are my impressions and any appropriate  recommendations for management based on the sonographic findings.   1. Twins DiDi [id number] Estimated Date of Delivery: 12/19/14 by serial  sonographic evaluations   2.  Fetal sonographic surveillance findings:   a). Normal fluid volume b). Normal antepartum fetal assessment with BPP 8/8 x 2 c). Normal fetal Doppler ratios with consistent diastolic flow x2  3.  Normal general sonographic findings  Recommend continued prenatal evaluations and care based on this  sonogram  and as clinically indicated from the patient's clinical course.  Lazaro Arms 11/26/2014 6:27 PM    US Fetal Bpp Wo Nst Addl Gestation  11/26/2014   TWINS FOLLOW UP SONOGRAM   Pamela Horn is in the office for a follow up sonogram of a twin  gestation.  She is a 34 y.o. year old G13P0101 with Estimated Date of Delivery: 12/19/14   now at  [redacted]w[redacted]d weeks gestation. Thus far the pregnancy has been otherwise  complicated by AMA, and twins..   The twins are dichorionic/diamniotic.   TWIN A GESTATION: twin  PRESENTATION: breech rt  FETAL ACTIVITY:          Heart rate         149bpm          The fetus is active.  AMNIOTIC FLUID: The amniotic fluid volume is  normal, 5.5 cm.SDP  PLACENTA LOCALIZATION:  posterior GRADE 1  CERVIX: Limited view  ADNEXA: Limited view   GESTATIONAL AGE AND  BIOMETRICS:  Gestational criteria: Estimated Date of Delivery: 12/19/14  now at [redacted]w[redacted]d  BIOPHYSCIAL PROFILE:                                                                                                       COMMENTS GROSS BODY MOVEMENT                 2   TONE                2   RESPIRATIONS                2   AMNIOTIC FLUID                2                                                            SCORE:  8/8 (Note: NST was not performed as part of this antepartum  testing)  DOPPLER FLOW STUDIES: UMBILICAL ARTERY RI RATIOS:   0.56, 0.56   Previous Scans:5                                    ASUSPECTED ABNORMALITIES:  no  QUALITY OF SCAN: Satisfactory    TWIN B  GESTATION: twin  PRESENTATION: Cephalic rt  FETAL ACTIVITY:          Heart rate         151bpm          The fetus is active.  AMNIOTIC FLUID: The amniotic fluid volume is  normal, 5.1 cm.  PLACENTA LOCALIZATION:  posterior GRADE 1  CERVIX: Limited view  ADNEXA: Limited view BIOPHYSCIAL PROFILE:                                                                                                       COMMENTS GROSS BODY MOVEMENT                 2   TONE                2    RESPIRATIONS                2   AMNIOTIC FLUID                2                                                            SCORE:  8/8 (Note: NST was not performed as part of this antepartum testing)       SUSPECTED ABNORMALITIES:  no  QUALITY OF SCAN: Satisfactory      TECHNICIAN COMMENTS:  [place] 36W tiwns Baby A breech rt,BPP 8/8,post pl,limited view of cx ,RI .56  s/d 2.28,FHT 150BPM,AFI 5.5CM sdp  Baby B cephalic lt,BPP 8/8,FHT 151 bpm,afi sdp  5.1,RI .63,.68  A copy of this report including all images has been saved and backed up to  a second source for retrieval if needed. All measures and details of the  anatomical scan, placentation, fluid volume and pelvic anatomy are  contained in that report.  Amber Flora Lipps 11/21/2014 2:16 PM  Clinical Impression and recommendations:  I have reviewed the sonogram results above, combined with the patient's  current clinical course, below are my impressions and any appropriate  recommendations for management based on the sonographic findings.   1. Twins DiDi [id number] Estimated Date of Delivery: 12/19/14 by serial  sonographic evaluations   2.  Fetal sonographic surveillance findings:   a). Normal fluid volume b). Normal antepartum fetal assessment with BPP 8/8 x 2 c). Normal fetal Doppler ratios with consistent diastolic flow x2  3.  Normal general sonographic findings  Recommend continued prenatal evaluations and  care based on this sonogram  and as clinically indicated from the patient's clinical course.  Lazaro Arms 11/26/2014 6:27 PM       Assessment: Twins DiDi Twin A breech [redacted]w[redacted]d  Gestational hypertension Patient Active Problem List   Diagnosis Date Noted  . Asymptomatic bacteriuria during pregnancy in first trimester 05/28/2014  . Supervision of other high-risk pregnancy 05/26/2014  . Dichorionic diamniotic twin gestation 05/26/2014  . History of cesarean delivery 05/26/2014  . Hx of preeclampsia, prior pregnancy, currently pregnant  05/26/2014  . AMA (advanced maternal age) multigravida 35+ 05/26/2014  . Smoker 05/26/2014    Plan: Repeat Caesarean section  EURE,LUTHER H 12/02/2014 12:33 PM

## 2014-12-02 NOTE — Transfer of Care (Signed)
Immediate Anesthesia Transfer of Care Note  Patient: Pamela Horn  Procedure(s) Performed: Procedure(s): CESAREAN SECTION (MULTIPLE) REPEAT (N/A)  Patient Location: PACU  Anesthesia Type:Spinal  Level of Consciousness: awake, alert  and oriented  Airway & Oxygen Therapy: Patient Spontanous Breathing  Post-op Assessment: Report given to RN and Post -op Vital signs reviewed and stable  Post vital signs: Reviewed and stable  Last Vitals:  Filed Vitals:   12/02/14 1044  BP: 135/95  Pulse: 87  Temp: 36.6 C  Resp: 18    Complications: No apparent anesthesia complications

## 2014-12-02 NOTE — Anesthesia Preprocedure Evaluation (Signed)
Anesthesia Evaluation  Patient identified by MRN, date of birth, ID band Patient awake    Reviewed: Allergy & Precautions, NPO status , Unable to perform ROS - Chart review only  History of Anesthesia Complications Negative for: history of anesthetic complications  Airway Mallampati: II  TM Distance: >3 FB Neck ROM: Full    Dental  (+) Poor Dentition, Dental Advisory Given   Pulmonary Current Smoker,    Pulmonary exam normal       Cardiovascular hypertension, Pt. on medications and Pt. on home beta blockers Normal cardiovascular exam    Neuro/Psych negative neurological ROS  negative psych ROS   GI/Hepatic negative GI ROS, Neg liver ROS,   Endo/Other  Morbid obesity  Renal/GU negative Renal ROS     Musculoskeletal negative musculoskeletal ROS (+)   Abdominal   Peds  Hematology   Anesthesia Other Findings   Reproductive/Obstetrics (+) Pregnancy                             Anesthesia Physical Anesthesia Plan  ASA: III  Anesthesia Plan: Spinal   Post-op Pain Management:    Induction:   Airway Management Planned: Simple Face Mask  Additional Equipment:   Intra-op Plan:   Post-operative Plan:   Informed Consent: I have reviewed the patients History and Physical, chart, labs and discussed the procedure including the risks, benefits and alternatives for the proposed anesthesia with the patient or authorized representative who has indicated his/her understanding and acceptance.   Dental advisory given  Plan Discussed with: CRNA, Anesthesiologist and Surgeon  Anesthesia Plan Comments:         Anesthesia Quick Evaluation

## 2014-12-02 NOTE — Op Note (Signed)
Cesarean Section Procedure Note   Pamela Horn  12/02/2014  Indications: Breech Presentation, previous cesarean section  Pre-operative Diagnosis: repeat c-section, breech twin A, diamniotic dichorionic twins  Post-operative Diagnosis: twin A cephalic   Surgeon: Surgeon(s) and Role:    * Lazaro ArmsLuther H Eure, MD - Primary    * Fredirick LatheKristy Ora Bollig, MD - Assisting   Attending: Duane LopeLuther Eure, MD  Assistants: Dr. Fredirick LatheKristy Adrienna Karis  Anesthesia: spinal    Estimated Blood Loss: 800  Total IV Fluids: 2000ml   Urine Output: 200CC OF clear urine  Specimens: placenta to path  Findings: Twin A female, cephalic, Twin B female footling breech, serosal-vesical adhesion, serosal peritoneal adhesion  Baby condition / location:  Couplet care / Skin to Skin  Twin A female, APGAR 5, 579 Twin B female APGAR 8, 9   Complications: no complications  Indications: Pamela Horn is a 34 y.o. 712P1101 with an IUP 3614w4d presenting for repeat cesarean section.  On ultrasound Twin A was breech, female however upon delivery Twin A was female and cephalic.  The risks, benefits, complications, treatment options, and expected outcomes were discussed with the patient . The patient concurred with the proposed plan, giving informed consent. identified as Pamela Horn and the procedure verified as C-Section Delivery.  Procedure Details: A Time Out was held and the above information confirmed.  The patient was taken back to the operative suite where spinal anesthesia was placed.  After induction of anesthesia, the patient was draped and prepped in the usual sterile manner and placed in a dorsal supine position with a leftward tilt. A transverse was made and carried down through the subcutaneous tissue to the fascia. Fascial incision was made and extended transversely. The fascia was separated from the underlying rectus tissue superiorly and inferiorly. The peritoneum was identified and entered. Peritoneal incision was extended  longitudinally. The superior edge of bladder was adhered high on serosa, easily taken down with metzenbaum and bladder flap created. A low transverse uterine incision was made. Female Twin A delivered from cephalic presentation was with Apgar scores of 5 at one minute and 9 at five minutes. Female Twin B delivered from footling breech presentation was with Apgar scores of 8 at one minute and 9 at five minutesCord ph was sent the umbilical cord was clamped and cut cord blood was obtained for evaluation. The placenta was removed Intact and appeared normal. The uterus was exteriorized.  The uterine outline, tubes and ovaries appeared normal. The uterine incision was closed with running locked sutures of 0 monocryl.  Serosal omental adhesion taken down with kelly clamp and tied.    Hemostasis was observed. Lavage was carried out until clear. The fascia was then reapproximated with running sutures of 1-0 Vicryl. The subcuticular closure was performed using 1-0plain gut. The skin was closed with 4-0Vicryl.   Instrument, sponge, and needle counts were correct prior the abdominal closure and were correct at the conclusion of the case.    Disposition: PACU - hemodynamically stable.   Maternal Condition: stable    Signed: Daymian Lill ROCIOMD 12/02/2014 2:16 PM

## 2014-12-02 NOTE — Anesthesia Postprocedure Evaluation (Signed)
Anesthesia Post Note  Patient: Pamela Horn  Procedure(s) Performed: Procedure(s) (LRB): CESAREAN SECTION (MULTIPLE) REPEAT (N/A)  Anesthesia type: SAB  Patient location: PACU  Post pain: Pain level controlled  Post assessment: Patient's Cardiovascular Status Stable  Last Vitals:  Filed Vitals:   12/02/14 1500  BP: 124/88  Pulse: 72  Temp:   Resp: 20    Post vital signs: Reviewed and stable  Level of consciousness: sedated  Complications: No apparent anesthesia complications

## 2014-12-02 NOTE — Lactation Note (Signed)
This note was copied from the chart of Pamela Horn. Lactation Consultation Note Mom has no BF experience. Has large pendulum breast w/good everted nipples. Hand expression taught w/easy flow of colostrum. Mom states the twins are BF great w/no painful latches. Mom encouraged to feed baby 8-12 times/24 hours and with feeding cues. Mom encouraged to waken baby for feeds. Discussed positions for BF, supply and demand, I&O, and 37 4/7 wk. Gest. Newborn behavior. Referred to Baby and Me Book in Breastfeeding section Pg. 22-23 for position options and Proper latch demonstration. Mom encouraged to do skin-to-skin.  Mom shown how to use DEBP & how to disassemble, clean, & reassemble parts. Mom knows to pump q3h for 15-20 min. Usually set up DEBP for mom w/twins for stimulation, but mom has a lot of colostrum, may not need to use it. Referred to Baby and Me Book in Breastfeeding section Pg. 22-23 for position options and Proper latch demonstration. Discussed the importance of documenting I&O. Patient Name: Pamela Horn Today's Date: 12/02/2014 Reason for consult: Initial assessment   Maternal Data Has patient been taught Hand Expression?: Yes Does the patient have breastfeeding experience prior to this delivery?: No  Feeding Feeding Type: Breast Fed Length of feed: 5 min  LATCH Score/Interventions Latch: Repeated attempts needed to sustain latch, nipple held in mouth throughout feeding, stimulation needed to elicit sucking reflex. Intervention(s): Adjust position;Assist with latch;Breast massage;Breast compression  Audible Swallowing: None Intervention(s): Hand expression  Type of Nipple: Everted at rest and after stimulation  Comfort (Breast/Nipple): Soft / non-tender     Hold (Positioning): Assistance needed to correctly position infant at breast and maintain latch. Intervention(s): Breastfeeding basics reviewed;Support Pillows;Position options;Skin to skin  LATCH Score:  6  Lactation Tools Discussed/Used Tools: Pump Breast pump type: Double-Electric Breast Pump Pump Review: Setup, frequency, and cleaning;Milk Storage Initiated by:: L. Iliana Hutt RN Date initiated:: 12/02/14   Consult Status Consult Status: Follow-up Date: 12/03/14 Follow-up type: In-patient    Darcell Sabino G 12/02/2014, 7:27 PM    

## 2014-12-03 ENCOUNTER — Encounter (HOSPITAL_COMMUNITY): Payer: Self-pay | Admitting: Obstetrics & Gynecology

## 2014-12-03 NOTE — Anesthesia Postprocedure Evaluation (Signed)
  Anesthesia Post-op Note  Patient: Pamela Horn  Procedure(s) Performed: Procedure(s): CESAREAN SECTION (MULTIPLE) REPEAT (N/A)  Patient Location: PACU and Mother/Baby  Anesthesia Type:Spinal  Level of Consciousness: awake, alert  and oriented  Airway and Oxygen Therapy: Patient Spontanous Breathing  Post-op Pain: none  Post-op Assessment: Post-op Vital signs reviewed and Patient's Cardiovascular Status Stable  Post-op Vital Signs: Reviewed and stable  Last Vitals:  Filed Vitals:   12/03/14 0800  BP: 131/91  Pulse: 79  Temp: 36.7 C  Resp: 20    Complications: No apparent anesthesia complications

## 2014-12-03 NOTE — Progress Notes (Signed)
UR chart review completed.  

## 2014-12-03 NOTE — Addendum Note (Signed)
Addendum  created 12/03/14 1022 by Elgie CongoNataliya H Anayi Bricco, CRNA   Modules edited: Notes Section   Notes Section:  File: [id number]

## 2014-12-03 NOTE — Lactation Note (Signed)
This note was copied from the chart of Pamela Colinda Vessell. Lactation Consultation Note; Assisted mom with latching baby boy in football position. He needed some stimulation to keep nursing, Swallows noted and mom reports she can easily express Colostrum. No questions at present. To call for assist prn  Patient Name: Pamela Horn Today's Date: 12/03/2014 Reason for consult: Follow-up assessment;Multiple gestation   Maternal Data    Feeding Feeding Type: Breast Fed Length of feed: 20 min  LATCH Score/Interventions Latch: Grasps breast easily, tongue down, lips flanged, rhythmical sucking. Intervention(s): Assist with latch;Adjust position  Audible Swallowing: A few with stimulation  Type of Nipple: Everted at rest and after stimulation  Comfort (Breast/Nipple): Soft / non-tender  Problem noted: Severe discomfort  Hold (Positioning): Assistance needed to correctly position infant at breast and maintain latch. Intervention(s): Breastfeeding basics reviewed;Position options  LATCH Score: 8  Lactation Tools Discussed/Used     Consult Status Consult Status: Follow-up Date: 12/04/14 Follow-up type: In-patient    Abiola Behring D 12/03/2014, 2:51 PM    

## 2014-12-03 NOTE — Lactation Note (Signed)
This note was copied from the chart of Pamela Horn. Lactation Consultation Note: Mom had baby girl latched when I went in- reports she has been nursing for 15 min. Still acting hungry- assisted with pillows to get more comfortable. Baby boy acting hungry so assisted mom with latching him to other breast. With assist mom very comfortable with nursing. Swallows heard for both babies- No questions at present. To call for assist prn  Patient Name: Pamela Pamela Horn Today's Date: 12/03/2014 Reason for consult: Follow-up assessment;Multiple gestation   Maternal Data Formula Feeding for Exclusion: No Has patient been taught Hand Expression?: Yes Does the patient have breastfeeding experience prior to this delivery?: Yes  Feeding Feeding Type: Breast Fed Length of feed: 30 min  LATCH Score/Interventions Latch: Grasps breast easily, tongue down, lips flanged, rhythmical sucking.  Audible Swallowing: A few with stimulation  Type of Nipple: Everted at rest and after stimulation  Comfort (Breast/Nipple): Soft / non-tender     Hold (Positioning): Assistance needed to correctly position infant at breast and maintain latch. Intervention(s): Breastfeeding basics reviewed;Support Pillows;Position options  LATCH Score: 8  Lactation Tools Discussed/Used     Consult Status Consult Status: Follow-up Date: 12/04/14 Follow-up type: In-patient    Jessina Marse D 12/03/2014, 2:48 PM    

## 2014-12-03 NOTE — Progress Notes (Signed)
Post Partum Day 1 Subjective:  Pamela Horn is a 34 y.o. [phone] [id number] 2723w4d s/p C-section.  No acute events overnight.  Pt is not aware if she can void 2/2 catheter removed 30 minutes prior to interview. She denies nausea or vomiting.  Pain is moderately controlled with Percocet but not Motrin or Tylenol. She has not had bowel movement.  Lochia Moderate as perceived but has not been visualized by pt.  Plan for birth control is bilateral tubal ligation planned to weeks after discharge, discussed with Dr. Elvina MattesEuer.  Method of Feeding: Breast. Infants are latching well.   Objective: Blood pressure 110/63, pulse 69, temperature 98.3 F (36.8 C), temperature source Oral, resp. rate 20, height 5\' 2"  (1.575 m), weight 101.152 kg (223 lb), last menstrual period 03/03/2014, SpO2 92 %, unknown if currently breastfeeding.  Physical Exam:  General: alert, cooperative and no distress Lochia:normal flow Chest: CTAB Heart: RRR no m/r/g Abdomen: +BS, soft, tender to palpation Uterine Fundus: deferred 2/2 abd tenderness to palpation DVT Evaluation: No evidence of DVT seen on physical exam. Extremities: slight left lower leg edema   Recent Labs  12/01/14 1410  HGB 11.2*  HCT 33.5*    Assessment/Plan:  ASSESSMENT: Pamela Horn is a 34 y.o. [id number] 1623w4d s/p C-section, doing well. Continue to monitor. BTL planned 2 weeks after discharge. Mom wants to ensure that babies are doing well before the procedure.    LOS: 1 day   Yosgar Demirjian, Medical Student 12/03/2014, 7:52 AM

## 2014-12-04 LAB — US OB FOLLOW UP

## 2014-12-04 NOTE — Progress Notes (Signed)
Post Partum Day 2 Subjective:  Pamela Horn is a 34 y.o. [phone] 4773w4d s/p repeat LTCS.  No acute events overnight.  Pt denies problems with ambulating, voiding, po intake.  She denies nausea or vomiting.  Pain is well controlled with Percocet.  She has not had flatus. She has not had bowel movement.  Lochia Small.  Plan for birth control is bilateral tubal ligation 2 weeks after discharge, scheduled with Dr. Elvina MattesEuer.  Method of Feeding: Breast.  Objective: Blood pressure 130/80, pulse 82, temperature 98 F (36.7 C), temperature source Oral, resp. rate 18, height 5\' 2"  (1.575 m), weight 101.152 kg (223 lb), last menstrual period 03/03/2014, SpO2 99 %, unknown if currently breastfeeding.  Physical Exam:  General: alert, cooperative and no distress Lochia: normal flow Chest: CTAB Heart: RRR no m/r/g Abdomen: +BS, soft, tender to palpation 2/2 LTCS Uterine Fundus: firm, 2 finger widths under umbilicus DVT Evaluation: No evidence of DVT seen on physical exam. Extremities: minimal edema, unchanged from yesterday    Recent Labs  12/01/14 1410  HGB 11.2*  HCT 33.5*    Assessment/Plan:  ASSESSMENT: Pamela Horn is a 34 y.o. [id number] 1973w4d s/p repeat LTCS, doing well. Continue to monitor. Per mom baby boy is not doing as well as she would like. She prefers discharge tomorrow to watch how Pamela Horn does. Circumcision planned for after discharge.   Plan for discharge tomorrow   LOS: 2 days   Pamela Horn, Medical Student 12/04/2014, 7:41 AM

## 2014-12-04 NOTE — Lactation Note (Signed)
This note was copied from the chart of Pamela Onesha Haft. Lactation Consultation Note  Follow up visit made.  Mom states babies are both cluster feeding. C/o sore nipples.  Nipples intact but red.  Comfort gels given with instructions.  [employer] DEBP initiated to assist in establishing good supply and obtain additional calories to babies.  Mom obtaining transitional milk.  I discussed tools we can use to give expressed milk and mom prefers slow flow nipples.  Instructed to post pump every three hours and give 1/2 amount to each baby.  Instructed to measure volume and record.  Encouraged to call for assist/concerns prn.   Patient Name: Pamela Horn Today's Date: 12/04/2014     Maternal Data    Feeding Feeding Type: Breast Fed Length of feed: 20 min  LATCH Score/Interventions                      Lactation Tools Discussed/Used     Consult Status      Nyrie Sigal S 12/04/2014, 3:34 PM    

## 2014-12-05 ENCOUNTER — Encounter (HOSPITAL_COMMUNITY): Payer: Self-pay | Admitting: Student

## 2014-12-05 LAB — TYPE AND SCREEN
ABO/RH(D): B POS
ANTIBODY SCREEN: NEGATIVE
UNIT DIVISION: 0
Unit division: 0

## 2014-12-05 MED ORDER — IBUPROFEN 600 MG PO TABS: 600.0000 mg | ORAL_TABLET | Freq: Four times a day (QID) | ORAL | Status: DC

## 2014-12-05 MED ORDER — OXYCODONE-ACETAMINOPHEN 5-325 MG PO TABS: 1.0000 | ORAL_TABLET | ORAL | Status: DC | PRN

## 2014-12-05 NOTE — Lactation Note (Addendum)
This note was copied from the chart of Pamela Tamsyn Scarberry. Lactation Consultation Note Mom had baby girl latched to the breast when I went in. Reports babies are nursing a lot and breasts are feeling fuller this morning. Has not pumped yet today- reports she is too busy nursing babies. Baby boy had fed about 1 hour ago and is asleep in mom's arms. Discussed supplementing with mom and encouraged to supplement with her own milk as she is able. Discussed methods of giving supplement and mom agreeable to cup feeding. Foley cups given and reviewed how to cup feed. MOm has WIC- encouraged to call them about a pump today- reviewed WIC loaner with mom. Encouraged to call for assist prn. No further questions at present To call prn  Patient Name: Pamela Horn Today's Date: 12/05/2014 Reason for consult: Follow-up assessment;Multiple gestation;Infant < 6lbs   Maternal Data Formula Feeding for Exclusion: No  Feeding Feeding Type: Breast Fed Length of feed: 15 min  LATCH Score/Interventions Latch: Grasps breast easily, tongue down, lips flanged, rhythmical sucking. Intervention(s): Assist with latch  Audible Swallowing: A few with stimulation  Type of Nipple: Everted at rest and after stimulation  Comfort (Breast/Nipple): Filling, red/small blisters or bruises, mild/mod discomfort  Problem noted: Mild/Moderate discomfort Interventions (Mild/moderate discomfort): Comfort gels;Hand expression  Hold (Positioning): No assistance needed to correctly position infant at breast.  LATCH Score: 8  Lactation Tools Discussed/Used WIC Program: Yes   Consult Status Consult Status: Follow-up Date: 12/06/14 Follow-up type: In-patient    Kennon Encinas D 12/05/2014, 11:15 AM    

## 2014-12-05 NOTE — Discharge Instructions (Signed)

## 2014-12-05 NOTE — Discharge Summary (Signed)
Obstetric Discharge Summary Reason for Admission: cesarean section Prenatal Procedures: gestational hypertension Intrapartum Procedures: cesarean: low cervical, transverse Postpartum Procedures: none Complications-Operative and Postpartum: none  A Time Out was held and the above information confirmed.  The patient was taken back to the operative suite where spinal anesthesia was placed. After induction of anesthesia, the patient was draped and prepped in the usual sterile manner and placed in a dorsal supine position with a leftward tilt. A transverse was made and carried down through the subcutaneous tissue to the fascia. Fascial incision was made and extended transversely. The fascia was separated from the underlying rectus tissue superiorly and inferiorly. The peritoneum was identified and entered. Peritoneal incision was extended longitudinally. The superior edge of bladder was adhered high on serosa, easily taken down with metzenbaum and bladder flap created. A low transverse uterine incision was made. Female Twin A delivered from cephalic presentation was with Apgar scores of 5 at one minute and 9 at five minutes. Female Twin B delivered from footling breech presentation was with Apgar scores of 8 at one minute and 9 at five minutesCord ph was sent the umbilical cord was clamped and cut cord blood was obtained for evaluation. The placenta was removed Intact and appeared normal. The uterus was exteriorized. The uterine outline, tubes and ovaries appeared normal. The uterine incision was closed with running locked sutures of 0 monocryl. Serosal omental adhesion taken down with kelly clamp and tied. Hemostasis was observed. Lavage was carried out until clear. The fascia was then reapproximated with running sutures of 1-0 Vicryl. The subcuticular closure was performed using 1-0plain gut. The skin was closed with 4-0Vicryl.   Instrument, sponge, and needle counts were correct prior the abdominal  closure and were correct at the conclusion of the case.   Hospital Course:  Active Problems:   Previous cesarean section   Pamela Horn is a 34 y.o. [phone] s/p LTCS.  Patient was admitted 12/02/14 for repeat C-section.  She has postpartum course that was uncomplicated. The pt feels ready to go home and  will be discharged with outpatient follow-up.   Today: No acute events overnight.  Pt denies problems with ambulating, voiding or po intake.  She denies nausea or vomiting.  Pain is well controlled.  She has had flatus. She has not had bowel movement.  Lochia Small.  Plan for birth control is  bilateral tubal ligation 2 weeks after discharge.  Method of Feeding: Breast.  Physical Exam:  General: alert, cooperative and no distress Lochia: appropriate Uterine Fundus: firm, 3 fingers below umbilicus Incision: healing well, no significant drainage, no significant erythema DVT Evaluation: No evidence of DVT seen on physical exam. Negative Homan's sign. No cords or calf tenderness. Extremities: Left leg edema with localized left ankle pain    H/H: Lab Results  Component Value Date/Time   HGB 11.2* 12/01/2014 02:10 PM   HCT 33.5* 12/01/2014 02:10 PM   HCT 34.0 11/06/2014 02:54 PM   Discharge Diagnoses: Early Term Pregnancy-delivered  Discharge Information: Date: 12/05/2014 Activity: pelvic rest Diet: routine  Medications: Ibuprofen and Percocet Breast feeding:  Yes Condition: stable Instructions: refer to handout Discharge to: home      Medication List    ASK your doctor about these medications        amoxicillin-clavulanate 875-125 MG per tablet  Commonly known as:  AUGMENTIN  Take 1 tablet by mouth 2 (two) times daily.     aspirin 81 MG tablet  Take 81 mg by mouth  daily.     CONCEPT DHA 53.5-38-1 MG Caps  Take 1 capsule by mouth daily.     diphenhydrAMINE 25 MG tablet  Commonly known as:  BENADRYL  Take 25 mg by mouth at bedtime as needed.     folic acid 800  MCG tablet  Commonly known as:  FOLVITE  Take 800 mcg by mouth daily.     labetalol 100 MG tablet  Commonly known as:  NORMODYNE  Take 1 tablet (100 mg total) by mouth 2 (two) times daily.     Pramoxine-HC-Chloroxylenol 10-10-1 MG/ML Soln  Place 2 drops in ear(s) 2 (two) times daily.        Yuridia Couts  Medical Student 12/05/2014,7:58 AM

## 2014-12-06 ENCOUNTER — Ambulatory Visit: Payer: Self-pay

## 2014-12-06 NOTE — Lactation Note (Signed)
This note was copied from the chart of BOYA Hayven Cowdery. Lactation Consultation Note  Patient Name: BOYA Arda Hinojos Today's Date: 12/06/2014 Reason for consult: Follow-up assessment;Infant weight loss;Other (Comment) (increase weight today , just  breast feeding )  Per mom ready for D/C. Baby recently was at the breast for 10 mins and per mom comfortable latch.  LC reviewed sore nipple and engorgement prevention and tx . Mom already has comfort gels ( 2 packs )  And desired a hand pump ( LC reviewed and showed mom how to set up . LC recommended a DEBP due to having Twins and weight loss. Mom declined today and was going to check at home to see what the type of pump a friend let her borrow. Mom wasn't sure if it was a DEBP  Or single. LC reassured mom LC services would be available after D/C and to call if needed on the BF help line. LC also suggested and LC O/P apt and mom did not accept offer today . LC offered extra diary sheets for both babies and mom declined , per mom didn't feel she  Would have time to write them down. LC stressed the importance of keeping track of I/O's. Mother informed of post-discharge support and given phone number to the lactation department, including services for phone call assistance; out-patient appointments; and breastfeeding support group. List of other breastfeeding resources in the community given in the handout. Encouraged mother to call for problems or concerns related to breastfeeding.   Maternal Data    Feeding Feeding Type: Breast Fed Length of feed: 10 min (per mom )  LATCH Score/Interventions                Intervention(s): Breastfeeding basics reviewed     Lactation Tools Discussed/Used Tools: Pump (LC instructed mom ) Breast pump type: Manual   Consult Status Consult Status: Complete Date: 12/06/14    Jania Steinke Ann 12/06/2014, 9:53 AM    

## 2014-12-09 ENCOUNTER — Ambulatory Visit (INDEPENDENT_AMBULATORY_CARE_PROVIDER_SITE_OTHER): Payer: Medicaid Other | Admitting: Obstetrics & Gynecology

## 2014-12-09 ENCOUNTER — Encounter: Payer: Self-pay | Admitting: Obstetrics & Gynecology

## 2014-12-09 VITALS — BP 140/90 | HR 80 | Wt 196.0 lb

## 2014-12-09 DIAGNOSIS — Z9889 Other specified postprocedural states: Secondary | ICD-10-CM

## 2014-12-09 MED ORDER — OXYCODONE-ACETAMINOPHEN 5-325 MG PO TABS: 1.0000 | ORAL_TABLET | ORAL | Status: DC | PRN

## 2014-12-09 NOTE — Progress Notes (Signed)
Patient ID: Pamela Horn, female   DOB: 1981-05-09, 34 y.o.   MRN: [id number]  HPI: Patient returns for routine postoperative follow-up having undergone repeat Caesarean section on 12/02/2014.  The patient's immediate postoperative recovery has been unremarkable. Since hospital discharge the patient reports doing well.   Current Outpatient Prescriptions: oxyCODONE-acetaminophen (PERCOCET/ROXICET) 5-325 MG per tablet, Take 1 tablet by mouth every 4 (four) hours as needed (for pain scale 4-7)., Disp: 30 tablet, Rfl: 0 Prenat-FeFum-FePo-FA-Omega 3 (CONCEPT DHA) 53.5-38-1 MG CAPS, Take 1 capsule by mouth daily., Disp: 30 capsule, Rfl: 11 folic acid (FOLVITE) 800 MCG tablet, Take 800 mcg by mouth daily., Disp: , Rfl:  ibuprofen (ADVIL,MOTRIN) 600 MG tablet, Take 1 tablet (600 mg total) by mouth every 6 (six) hours. (Patient not taking: Reported on 12/09/2014), Disp: 30 tablet, Rfl: 0  No current facility-administered medications for this visit.    Blood pressure 140/90, pulse 80, weight 196 lb (88.905 kg), last menstrual period 03/03/2014, unknown if currently breastfeeding.  Physical Exam: Abdomen soft normal post op lymphatic compression  Diagnostic Tests: none  Pathology: negative  Impression: S/P repeat Caesarean section  Plan:   Follow up: 4  weeks  Lazaro ArmsEURE,LUTHER H, MD

## 2015-01-06 ENCOUNTER — Ambulatory Visit: Payer: Medicaid Other | Admitting: Obstetrics & Gynecology

## 2015-01-08 ENCOUNTER — Ambulatory Visit (INDEPENDENT_AMBULATORY_CARE_PROVIDER_SITE_OTHER): Payer: Medicaid Other | Admitting: Obstetrics & Gynecology

## 2015-01-08 ENCOUNTER — Encounter: Payer: Self-pay | Admitting: Obstetrics & Gynecology

## 2015-01-08 MED ORDER — DESOGESTREL-ETHINYL ESTRADIOL 0.15-30 MG-MCG PO TABS: 1.0000 | ORAL_TABLET | Freq: Every day | ORAL | Status: DC

## 2015-01-08 NOTE — Progress Notes (Signed)
Patient ID: Pamela Horn [place], female   DOB: [birth year], 34 Horn.o.   MRN: [id number] Subjective:     Pamela Horn Warda is a 34 Horn.o. female who presents for a postpartum visit. She is 5 weeks postpartum following a low cervical transverse Cesarean section. I have fully reviewed the prenatal and intrapartum course. The delivery was at 37 gestational weeks. Outcome: primary cesarean section, low transverse incision. Anesthesia: spinal. Postpartum course has been unremarkable. Baby's course has been unremarkable. Baby is feeding by . Bleeding no bleeding. Bowel function is normal. Bladder function is normal. Patient is sexually active. Contraception method is condoms. Postpartum depression screening: negative.    Review of Systems Pertinent items are noted in HPI.   Objective:    BP 122/90 mmHg  Pulse 80  Wt 179 lb (81.194 kg)  General:  alert, cooperative and no distress   Breasts:    Lungs:   Heart:    Abdomen:    Vulva:  normal  Vagina: normal vagina, no discharge, exudate, lesion, or erythema  Cervix:  normal  Corpus: normal size, contour, position, consistency, mobility, non-tender  Adnexa:  normal adnexa  Rectal Exam:         Assessment:     normal postpartum exam. Pap smear not done at today's visit.   Plan:    1. Contraception: OCP (estrogen/progesterone) 2. Desogestrel ocp 3. Follow up in: 1 year or as needed.

## 2015-01-29 ENCOUNTER — Encounter: Payer: Self-pay | Admitting: Obstetrics & Gynecology

## 2015-01-29 ENCOUNTER — Ambulatory Visit (INDEPENDENT_AMBULATORY_CARE_PROVIDER_SITE_OTHER): Payer: 59 | Admitting: Obstetrics & Gynecology

## 2015-01-29 VITALS — BP 116/82 | HR 64 | Ht 62.0 in | Wt 178.0 lb

## 2015-01-29 DIAGNOSIS — Z3009 Encounter for other general counseling and advice on contraception: Secondary | ICD-10-CM

## 2015-01-29 DIAGNOSIS — Z01818 Encounter for other preprocedural examination: Secondary | ICD-10-CM | POA: Diagnosis not present

## 2015-02-02 NOTE — Progress Notes (Signed)
Patient ID: Pamela Horn, female   DOB: 1981/05/24, 34 y.o.   MRN: [id number] Preoperative History and Physical  Pamela Horn is a 34 y.o. [phone] with No LMP recorded. admitted for a laparoscopic salpingectomy for permanent sterilization .  Pt did not want to have it performed at the time of her Caesarean section in case there was a problem with either of the twins  PMH:    Past Medical History  Diagnosis Date  . Pregnancy induced hypertension   . Pre-eclampsia     1st pregnancy    PSH:     Past Surgical History  Procedure Laterality Date  . Colposcopy  04/04/2000 & 10/03/2000  . Gynecologic cryosurgery  11/07/2000  . Cesarean section  10/18/1999  . Cesarean section N/A 12/02/2014    Procedure: CESAREAN SECTION (MULTIPLE) REPEAT;  Surgeon: Lazaro Arms, MD;  Location: [facility];  Service: Obstetrics;  Laterality: N/A;    POb/GynH:      OB History    Gravida Para Term Preterm AB TAB SAB Ectopic Multiple Living   SH:   History  Substance Use Topics  . Smoking status: Current Every Day Smoker -- 1.00 packs/day for 20 years    Types: Cigarettes  . Smokeless tobacco: Never Used  . Alcohol Use: No    FH:    Family History  Problem Relation Age of Onset  . Hypertension Mother   . Cancer Father     melanoma  . Cancer Sister   . Diabetes Paternal Grandfather      Allergies: No Known Allergies  Medications:       Current outpatient prescriptions:  .  desogestrel-ethinyl estradiol (APRI,EMOQUETTE,SOLIA) 0.15-30 MG-MCG tablet, Take 1 tablet by mouth daily., Disp: 1 Package, Rfl: 11 .  folic acid (FOLVITE) 800 MCG tablet, Take 800 mcg by mouth daily., Disp: , Rfl:  .  ibuprofen (ADVIL,MOTRIN) 600 MG tablet, Take 1 tablet (600 mg total) by mouth every 6 (six) hours. (Patient not taking: Reported on 12/09/2014), Disp: 30 tablet, Rfl: 0 .  oxyCODONE-acetaminophen (PERCOCET/ROXICET) 5-325 MG per tablet, Take 1 tablet by mouth every 4 (four) hours as needed  (for pain scale 4-7). (Patient not taking: Reported on 01/08/2015), Disp: 20 tablet, Rfl: 0 .  Prenat-FeFum-FePo-FA-Omega 3 (CONCEPT DHA) 53.5-38-1 MG CAPS, Take 1 capsule by mouth daily. (Patient not taking: Reported on 01/08/2015), Disp: 30 capsule, Rfl: 11  Review of Systems:   Review of Systems  Constitutional: Negative for fever, chills, weight loss, malaise/fatigue and diaphoresis.  HENT: Negative for hearing loss, ear pain, nosebleeds, congestion, sore throat, neck pain, tinnitus and ear discharge.   Eyes: Negative for blurred vision, double vision, photophobia, pain, discharge and redness.  Respiratory: Negative for cough, hemoptysis, sputum production, shortness of breath, wheezing and stridor.   Cardiovascular: Negative for chest pain, palpitations, orthopnea, claudication, leg swelling and PND.  Gastrointestinal: Positive for abdominal pain. Negative for heartburn, nausea, vomiting, diarrhea, constipation, blood in stool and melena.  Genitourinary: Negative for dysuria, urgency, frequency, hematuria and flank pain.  Musculoskeletal: Negative for myalgias, back pain, joint pain and falls.  Skin: Negative for itching and rash.  Neurological: Negative for dizziness, tingling, tremors, sensory change, speech change, focal weakness, seizures, loss of consciousness, weakness and headaches.  Endo/Heme/Allergies: Negative for environmental allergies and polydipsia. Does not bruise/bleed easily.  Psychiatric/Behavioral: Negative for depression, suicidal ideas, hallucinations, memory loss and substance abuse. The patient  is not nervous/anxious and does not have insomnia.      PHYSICAL EXAM:  Blood pressure 116/82, pulse 64, height 5\' 2"  (1.575 m), weight 178 lb (80.74 kg), unknown if currently breastfeeding.    Vitals reviewed. Constitutional: She is oriented to person, place, and time. She appears well-developed and well-nourished.  HENT:  Head: Normocephalic and atraumatic.  Right Ear:  External ear normal.  Left Ear: External ear normal.  Nose: Nose normal.  Mouth/Throat: Oropharynx is clear and moist.  Eyes: Conjunctivae and EOM are normal. Pupils are equal, round, and reactive to light. Right eye exhibits no discharge. Left eye exhibits no discharge. No scleral icterus.  Neck: Normal range of motion. Neck supple. No tracheal deviation present. No thyromegaly present.  Cardiovascular: Normal rate, regular rhythm, normal heart sounds and intact distal pulses.  Exam reveals no gallop and no friction rub.   No murmur heard. Respiratory: Effort normal and breath sounds normal. No respiratory distress. She has no wheezes. She has no rales. She exhibits no tenderness.  GI: Soft. Bowel sounds are normal. She exhibits no distension and no mass. There is tenderness. There is no rebound and no guarding.  Genitourinary:       Vulva is normal without lesions Vagina is pink moist without discharge Cervix normal in appearance and pap is normal Uterus is normal size, contour, position, consistency, mobility, non-tender Adnexa is negative with normal sized ovaries by sonogram  Musculoskeletal: Normal range of motion. She exhibits no edema and no tenderness.  Neurological: She is alert and oriented to person, place, and time. She has normal reflexes. She displays normal reflexes. No cranial nerve deficit. She exhibits normal muscle tone. Coordination normal.  Skin: Skin is warm and dry. No rash noted. No erythema. No pallor.  Psychiatric: She has a normal mood and affect. Her behavior is normal. Judgment and thought content normal.    Labs: No results found for this or any previous visit (from the past 336 hour(s)).  EKG: No orders found for this or any previous visit.  Imaging Studies: No results found.    Assessment: deisres permanent sterilization Patient Active Problem List   Diagnosis Date Noted  . Previous cesarean section 12/02/2014  . Asymptomatic bacteriuria during  pregnancy in first trimester 05/28/2014  . Supervision of other high-risk pregnancy 05/26/2014  . Dichorionic diamniotic twin gestation 05/26/2014  . History of cesarean delivery 05/26/2014  . Hx of preeclampsia, prior pregnancy, currently pregnant 05/26/2014  . AMA (advanced maternal age) multigravida 35+ 05/26/2014  . Smoker 05/26/2014    Plan: Laparoscopic salpingectomy bilateral  EURE,LUTHER H

## 2015-02-04 ENCOUNTER — Telehealth: Payer: Self-pay | Admitting: *Deleted

## 2015-02-05 ENCOUNTER — Telehealth: Payer: Self-pay | Admitting: *Deleted

## 2015-02-05 NOTE — Telephone Encounter (Signed)
Work note completed and faxed per pt request. Faxed to [phone].

## 2015-02-05 NOTE — Telephone Encounter (Signed)
Pt employer requesting work note to be re-faxed with no restrictions. Note faxed per request.

## 2015-02-17 NOTE — Patient Instructions (Signed)
Pamela Horn  02/17/2015     @PREFPERIOPPHARMACY @   Your procedure is scheduled on  02/25/2015  Report to [facility] at  845  A.M.  Call this number if you have problems the morning of surgery:  [phone]   Remember:  Do not eat food or drink liquids after midnight.  Take these medicines the morning of surgery with A SIP OF WATER  none   Do not wear jewelry, make-up or nail polish.  Do not wear lotions, powders, or perfumes.    Do not shave 48 hours prior to surgery.  Men may shave face and neck.  Do not bring valuables to the hospital.  [facility] is not responsible for any belongings or valuables.  Contacts, dentures or bridgework may not be worn into surgery.  Leave your suitcase in the car.  After surgery it may be brought to your room.  For patients admitted to the hospital, discharge time will be determined by your treatment team.  Patients discharged the day of surgery will not be allowed to drive home.   Name and phone number of your driver:   family Special instructions:  none  Please read over the following fact sheets that you were given. Pain Booklet, Coughing and Deep Breathing, Surgical Site Infection Prevention, Anesthesia Post-op Instructions and Care and Recovery After Surgery      Sterilization Information, Female Female sterilization is a procedure to permanently prevent pregnancy. There are different ways to perform sterilization, but all either block or close the fallopian tubes so that your eggs cannot reach your uterus. If your egg cannot reach your uterus, sperm cannot fertilize the egg, and you cannot get pregnant.  Sterilization is performed by a surgical procedure. Sometimes these procedures are performed in a hospital while a patient is asleep. Sometimes they can be done in a clinic setting with the patient awake. The fallopian tubes can be surgically cut, tied, or sealed through a procedure called tubal ligation. The fallopian tubes  can also be closed with clips or rings. Sterilization can also be done by placing a tiny coil into each fallopian tube, which causes scar tissue to grow inside the tube. The scar tissue then blocks the tubes.  Discuss sterilization with your caregiver to answer any concerns you or your partner may have. You may want to ask what type of sterilization your caregiver performs. Some caregivers may not perform all the various options. Sterilization is permanent and should only be done if you are sure you do not want children or do not want any more children. Having a sterilization reversed may not be successful.  STERILIZATION PROCEDURES  Laparoscopic sterilization. This is a surgical method performed at a time other than right after childbirth. Two incisions are made in the lower abdomen. A thin, lighted tube (laparoscope) is inserted into one of the incisions and is used to perform the procedure. The fallopian tubes are closed with a ring or a clip. An instrument that uses heat could be used to seal the tubes closed (electrocautery).   Mini-laparotomy. This is a surgical method done 1 or 2 days after giving birth. Typically, a small incision is made just below the belly button (umbilicus) and the fallopian tubes are exposed. The tubes can then be sealed, tied, or cut.   Hysteroscopic sterilization. This is performed at a time other than right after childbirth. A tiny, spring-like coil is inserted through the cervix and uterus and  placed into the fallopian tubes. The coil causes scaring and blocks the tubes. Other forms of contraception should be used for 3 months after the procedure to allow the scar tissue to form completely. Additionally, it is required hysterosalpingography be done 3 months later to ensure that the procedure was successful. Hysterosalpingography is a procedure that uses X-rays to look at your uterus and fallopian tubes after a material to make them show up better has been inserted. IS  STERILIZATION SAFE? Sterilization is considered safe with very rare complications. Risks depend on the type of procedure you have. As with any surgical procedure, there are risks. Some risks of sterilization by any means include:   Bleeding.  Infection.  Reaction to anesthesia medicine.  Injury to surrounding organs. Risks specific to having hysteroscopic coils placed include:  The coils may not be placed correctly the first time.   The coils may move out of place.   The tubes may not get completely blocked after 3 months.   Injury to surrounding organs when placing the coil.  HOW EFFECTIVE IS FEMALE STERILIZATION? Sterilization is nearly 100% effective, but it can fail. Depending on the type of sterilization, the rate of failure can be as high as 3%. After hysteroscopic sterilization with placement of fallopian tube coils, you will need back-up birth control for 3 months after the procedure. Sterilization is effective for a lifetime.  BENEFITS OF STERILIZATION  It does not affect your hormones, and therefore will not affect your menstrual periods, sexual desire, or performance.   It is effective for a lifetime.   It is safe.   You do not need to worry about getting pregnant. Keep in mind that if you had the hysteroscopic placement procedure, you must wait 3 months after the procedure (or until your caregiver confirms) before pregnancy is not considered possible.   There are no side effects unlike other types of birth control (contraception).  DRAWBACKS OF STERILIZATION  You must be sure you do not want children or any more children. The procedure is permanent.   It does not provide protection against sexually transmitted infections (STIs).   The tubes can grow back together. If this happens, there is a risk of pregnancy. There is also an increased risk (50%) of pregnancy being an ectopic pregnancy. This is a pregnancy that happens outside of the uterus. Document  Released: 12/14/2007 Document Revised: 07/02/2013 Document Reviewed: 10/13/2011 [facility] Patient Information 2015 [place], [place]. This information is not intended to replace advice given to you by your health care provider. Make sure you discuss any questions you have with your health care provider. Bilateral Salpingo-Oophorectomy Bilateral salpingo-oophorectomy is the surgical removal of both fallopian tubes and both ovaries. The ovaries are small organs that produce eggs in women. The fallopian tubes transport the egg from the ovary to the womb (uterus). Usually, when this surgery is done, the uterus was previously removed. A bilateral salpingo-oophorectomy may be done to treat cancer or to reduce the risk of cancer in women who are at high risk. Removing both fallopian tubes and both ovaries will make you unable to become pregnant (sterile). It will also put you into menopause so that you will no longer have menstrual periods and may have menopausal symptoms such as hot flashes, night sweats, and mood changes. It will not affect your sex drive. LET [facility] CARE PROVIDER KNOW ABOUT:  Any allergies you have.  All medicines you are taking, including vitamins, herbs, eye drops, creams, and over-the-counter medicines.  Previous problems you or members of your family have had with the use of anesthetics.  Any blood disorders you have.  Previous surgeries you have had.  Medical conditions you have. RISKS AND COMPLICATIONS Generally, this is a safe procedure. However, as with any procedure, complications can occur. Possible complications include:  Injury to surrounding organs.  Bleeding.  Infection.  Blood clots in the legs or lungs.  Problems related to anesthesia. BEFORE THE PROCEDURE  Ask your health care provider about changing or stopping your regular medicines. You may need to stop taking certain medicines, such as aspirin or blood thinners, at least 1 week before the  surgery.  Do not eat or drink anything for at least 8 hours before the surgery.  If you smoke, do not smoke for at least 2 weeks before the surgery.  Make plans to have someone drive you home after the procedure or after your hospital stay. Also arrange for someone to help you with activities during recovery. PROCEDURE   You will be given medicine to help you relax before the procedure (sedative). You will then be given medicine to make you sleep through the procedure (general anesthetic). These medicines will be given through an IV access tube that is put into one of your veins.  Once you are asleep, your lower abdomen will be shaved and cleaned. A thin, flexible tube (catheter) will be placed in your bladder.  The surgeon may use a laparoscopic, robotic, or open technique for this surgery:  In the laparoscopic technique, the surgery is done through two small cuts (incisions) in the abdomen. A thin, lighted tube with a tiny camera on the end (laparoscope) is inserted into one of the incisions. The tools needed for the procedure are put through the other incision.  A robotic technique may be chosen to perform complex surgery in a small space. In the robotic technique, small incisions will be made. A camera and surgical instruments are passed through the incisions. Surgical instruments will be controlled with the help of a robotic arm.  In the open technique, the surgery is done through one large incision in the abdomen.  Using any of these techniques, the surgeon removes the fallopian tubes and ovaries. The blood vessels will be clamped and tied.  The surgeon then uses staples or stitches to close the incision or incisions. AFTER THE PROCEDURE  You will be taken to a recovery area where you will be monitored for 1 to 3 hours. Your blood pressure, pulse, and temperature will be checked often. You will remain in the recovery area until you are stable and waking up.  If the laparoscopic  technique was used, you may be allowed to go home after several hours. You may have some shoulder pain after the laparoscopic procedure. This is normal and usually goes away in a day or two.  If the open technique was used, you will be admitted to the hospital for a couple of days.  You will be given pain medicine as needed.  The IV access tube and catheter will be removed before you are discharged. Document Released: 06/27/2005 Document Revised: 07/02/2013 Document Reviewed: 12/19/2012 [facility] Patient Information 2015 [place], [place]. This information is not intended to replace advice given to you by your health care provider. Make sure you discuss any questions you have with your health care provider. PATIENT INSTRUCTIONS POST-ANESTHESIA  IMMEDIATELY FOLLOWING SURGERY:  Do not drive or operate machinery for the first twenty four hours after surgery.  Do not  make any important decisions for twenty four hours after surgery or while taking narcotic pain medications or sedatives.  If you develop intractable nausea and vomiting or a severe headache please notify your doctor immediately.  FOLLOW-UP:  Please make an appointment with your surgeon as instructed. You do not need to follow up with anesthesia unless specifically instructed to do so.  WOUND CARE INSTRUCTIONS (if applicable):  Keep a dry clean dressing on the anesthesia/puncture wound site if there is drainage.  Once the wound has quit draining you may leave it open to air.  Generally you should leave the bandage intact for twenty four hours unless there is drainage.  If the epidural site drains for more than 36-48 hours please call the anesthesia department.  QUESTIONS?:  Please feel free to call your physician or the hospital operator if you have any questions, and they will be happy to assist you.

## 2015-02-19 ENCOUNTER — Other Ambulatory Visit: Payer: Self-pay | Admitting: Obstetrics & Gynecology

## 2015-02-19 ENCOUNTER — Encounter (HOSPITAL_COMMUNITY)
Admission: RE | Admit: 2015-02-19 | Discharge: 2015-02-19 | Disposition: A | Discharge status: Home / Self Care | Payer: 59 | Source: Ambulatory Visit | Attending: Obstetrics & Gynecology | Admitting: Obstetrics & Gynecology

## 2015-02-19 ENCOUNTER — Encounter (HOSPITAL_COMMUNITY): Payer: Self-pay

## 2015-02-19 DIAGNOSIS — Z01818 Encounter for other preprocedural examination: Secondary | ICD-10-CM | POA: Insufficient documentation

## 2015-02-19 LAB — URINALYSIS, ROUTINE W REFLEX MICROSCOPIC
BILIRUBIN URINE: NEGATIVE
GLUCOSE, UA: NEGATIVE mg/dL
Ketones, ur: NEGATIVE mg/dL
NITRITE: NEGATIVE
PH: 6 (ref 5.0–8.0)
Protein, ur: 30 mg/dL — AB
Urobilinogen, UA: 0.2 mg/dL (ref 0.0–1.0)

## 2015-02-19 LAB — COMPREHENSIVE METABOLIC PANEL
ALK PHOS: 64 U/L (ref 38–126)
ALT: 17 U/L (ref 14–54)
AST: 20 U/L (ref 15–41)
Albumin: 3.5 g/dL (ref 3.5–5.0)
Anion gap: 10 (ref 5–15)
BILIRUBIN TOTAL: 0.3 mg/dL (ref 0.3–1.2)
BUN: 9 mg/dL (ref 6–20)
CHLORIDE: 107 mmol/L (ref 101–111)
CO2: 21 mmol/L — AB (ref 22–32)
CREATININE: 1 mg/dL (ref 0.44–1.00)
Calcium: 8.3 mg/dL — ABNORMAL LOW (ref 8.9–10.3)
GFR calc Af Amer: >60 mL/min (ref >60)
Glucose, Bld: 175 mg/dL — ABNORMAL HIGH (ref 65–99)
POTASSIUM: 3.7 mmol/L (ref 3.5–5.1)
Sodium: 138 mmol/L (ref 135–145)
Total Protein: 6.6 g/dL (ref 6.5–8.1)

## 2015-02-19 LAB — CBC
HEMATOCRIT: 39.2 % (ref 36.0–46.0)
HEMOGLOBIN: 12.9 g/dL (ref 12.0–15.0)
MCH: 27.2 pg (ref 26.0–34.0)
MCHC: 32.9 g/dL (ref 30.0–36.0)
MCV: 82.7 fL (ref 78.0–100.0)
Platelets: 317 10*3/uL (ref 150–400)
RBC: 4.74 MIL/uL (ref 3.87–5.11)
RDW: 14 % (ref 11.5–15.5)
WBC: 6.7 10*3/uL (ref 4.0–10.5)

## 2015-02-19 LAB — HCG, QUANTITATIVE, PREGNANCY: HCG, BETA CHAIN, QUANT, S: 1 m[IU]/mL (ref <5)

## 2015-02-19 LAB — URINE MICROSCOPIC-ADD ON

## 2015-02-25 ENCOUNTER — Encounter (HOSPITAL_COMMUNITY): Payer: Self-pay | Admitting: *Deleted

## 2015-02-25 ENCOUNTER — Encounter (HOSPITAL_COMMUNITY)
Admission: RE | Disposition: A | Discharge status: Home / Self Care | Payer: Self-pay | Source: Ambulatory Visit | Attending: Obstetrics & Gynecology

## 2015-02-25 ENCOUNTER — Ambulatory Visit (HOSPITAL_COMMUNITY)
Admission: RE | Admit: 2015-02-25 | Discharge: 2015-02-25 | Disposition: A | Discharge status: Home / Self Care | Payer: 59 | Source: Ambulatory Visit | Attending: Obstetrics & Gynecology | Admitting: Obstetrics & Gynecology

## 2015-02-25 ENCOUNTER — Ambulatory Visit (HOSPITAL_COMMUNITY): Payer: 59 | Admitting: Anesthesiology

## 2015-02-25 DIAGNOSIS — E669 Obesity, unspecified: Secondary | ICD-10-CM | POA: Diagnosis not present

## 2015-02-25 DIAGNOSIS — Z79899 Other long term (current) drug therapy: Secondary | ICD-10-CM | POA: Diagnosis not present

## 2015-02-25 DIAGNOSIS — Z6832 Body mass index (BMI) 32.0-32.9, adult: Secondary | ICD-10-CM | POA: Insufficient documentation

## 2015-02-25 DIAGNOSIS — Z302 Encounter for sterilization: Secondary | ICD-10-CM | POA: Diagnosis not present

## 2015-02-25 DIAGNOSIS — F17210 Nicotine dependence, cigarettes, uncomplicated: Secondary | ICD-10-CM | POA: Diagnosis not present

## 2015-02-25 DIAGNOSIS — N838 Other noninflammatory disorders of ovary, fallopian tube and broad ligament: Secondary | ICD-10-CM | POA: Diagnosis not present

## 2015-02-25 DIAGNOSIS — I10 Essential (primary) hypertension: Secondary | ICD-10-CM | POA: Diagnosis not present

## 2015-02-25 HISTORY — PX: LAPAROSCOPIC BILATERAL SALPINGECTOMY: SHX5889

## 2015-02-25 LAB — GLUCOSE, CAPILLARY: GLUCOSE-CAPILLARY: 112 mg/dL — AB (ref 65–99)

## 2015-02-25 SURGERY — SALPINGECTOMY, BILATERAL, LAPAROSCOPIC
Anesthesia: General | Laterality: Bilateral

## 2015-02-25 MED ORDER — ONDANSETRON HCL 8 MG PO TABS: 8.0000 mg | ORAL_TABLET | Freq: Three times a day (TID) | ORAL | Status: DC | PRN

## 2015-02-25 MED ORDER — KETOROLAC TROMETHAMINE 30 MG/ML IJ SOLN
INTRAMUSCULAR | Status: AC
  Filled 2015-02-25: qty 1

## 2015-02-25 MED ORDER — MIDAZOLAM HCL 2 MG/2ML IJ SOLN
1.0000 mg | INTRAMUSCULAR | Status: DC | PRN
  Administered 2015-02-25: 2 mg via INTRAVENOUS

## 2015-02-25 MED ORDER — ONDANSETRON HCL 4 MG/2ML IJ SOLN: 4.0000 mg | Freq: Once | INTRAMUSCULAR | Status: DC | PRN

## 2015-02-25 MED ORDER — BUPIVACAINE LIPOSOME 1.3 % IJ SUSP
INTRAMUSCULAR | Status: AC
  Filled 2015-02-25: qty 20

## 2015-02-25 MED ORDER — GLYCOPYRROLATE 0.2 MG/ML IJ SOLN
INTRAMUSCULAR | Status: DC | PRN
  Administered 2015-02-25: 0.6 mg via INTRAVENOUS
  Administered 2015-02-25: 0.2 mg via INTRAVENOUS

## 2015-02-25 MED ORDER — FENTANYL CITRATE (PF) 100 MCG/2ML IJ SOLN: 25.0000 ug | INTRAMUSCULAR | Status: DC | PRN

## 2015-02-25 MED ORDER — KETOROLAC TROMETHAMINE 10 MG PO TABS: 10.0000 mg | ORAL_TABLET | Freq: Three times a day (TID) | ORAL | Status: DC | PRN

## 2015-02-25 MED ORDER — LACTATED RINGERS IV SOLN
INTRAVENOUS | Status: DC
  Administered 2015-02-25 (×2): via INTRAVENOUS

## 2015-02-25 MED ORDER — ROCURONIUM BROMIDE 100 MG/10ML IV SOLN
INTRAVENOUS | Status: DC | PRN
  Administered 2015-02-25: 40 mg via INTRAVENOUS
  Administered 2015-02-25: 10 mg via INTRAVENOUS

## 2015-02-25 MED ORDER — DEXAMETHASONE SODIUM PHOSPHATE 4 MG/ML IJ SOLN
4.0000 mg | Freq: Once | INTRAMUSCULAR | Status: AC
  Administered 2015-02-25: 4 mg via INTRAVENOUS

## 2015-02-25 MED ORDER — OXYCODONE-ACETAMINOPHEN 5-325 MG PO TABS: 1.0000 | ORAL_TABLET | ORAL | Status: DC | PRN

## 2015-02-25 MED ORDER — MIDAZOLAM HCL 2 MG/2ML IJ SOLN
INTRAMUSCULAR | Status: AC
  Filled 2015-02-25: qty 2

## 2015-02-25 MED ORDER — BUPIVACAINE LIPOSOME 1.3 % IJ SUSP
INTRAMUSCULAR | Status: DC | PRN
  Administered 2015-02-25: 20 mL

## 2015-02-25 MED ORDER — CEFAZOLIN SODIUM-DEXTROSE 2-3 GM-% IV SOLR
2.0000 g | INTRAVENOUS | Status: AC
  Administered 2015-02-25: 2 mg via INTRAVENOUS

## 2015-02-25 MED ORDER — NEOSTIGMINE METHYLSULFATE 10 MG/10ML IV SOLN
INTRAVENOUS | Status: DC | PRN
  Administered 2015-02-25: 4 mg via INTRAVENOUS

## 2015-02-25 MED ORDER — FENTANYL CITRATE (PF) 100 MCG/2ML IJ SOLN
INTRAMUSCULAR | Status: DC | PRN
  Administered 2015-02-25: 100 ug via INTRAVENOUS
  Administered 2015-02-25 (×3): 50 ug via INTRAVENOUS

## 2015-02-25 MED ORDER — KETOROLAC TROMETHAMINE 30 MG/ML IJ SOLN
30.0000 mg | Freq: Once | INTRAMUSCULAR | Status: AC
  Administered 2015-02-25: 30 mg via INTRAVENOUS

## 2015-02-25 MED ORDER — LIDOCAINE HCL (CARDIAC) 20 MG/ML IV SOLN
INTRAVENOUS | Status: DC | PRN
  Administered 2015-02-25: 150 mg via INTRAVENOUS
  Administered 2015-02-25: 50 mg via INTRAVENOUS

## 2015-02-25 MED ORDER — ONDANSETRON HCL 4 MG/2ML IJ SOLN
4.0000 mg | Freq: Once | INTRAMUSCULAR | Status: AC
  Administered 2015-02-25: 4 mg via INTRAVENOUS

## 2015-02-25 MED ORDER — ONDANSETRON HCL 4 MG/2ML IJ SOLN
INTRAMUSCULAR | Status: AC
  Filled 2015-02-25: qty 2

## 2015-02-25 MED ORDER — SODIUM CHLORIDE 0.9 % IR SOLN
Status: DC | PRN
  Administered 2015-02-25: 1000 mL

## 2015-02-25 MED ORDER — CEFAZOLIN SODIUM-DEXTROSE 2-3 GM-% IV SOLR
INTRAVENOUS | Status: AC
  Filled 2015-02-25: qty 50

## 2015-02-25 MED ORDER — DEXAMETHASONE SODIUM PHOSPHATE 4 MG/ML IJ SOLN
INTRAMUSCULAR | Status: AC
  Filled 2015-02-25: qty 1

## 2015-02-25 SURGICAL SUPPLY — 38 items
BAG HAMPER (MISCELLANEOUS) ×3 IMPLANT
BLADE SURG SZ11 CARB STEEL (BLADE) ×3 IMPLANT
CLOTH BEACON ORANGE TIMEOUT ST (SAFETY) ×3 IMPLANT
COVER LIGHT HANDLE STERIS (MISCELLANEOUS) ×6 IMPLANT
DRAPE PROXIMA HALF (DRAPES) ×3 IMPLANT
ELECT REM PT RETURN 9FT ADLT (ELECTROSURGICAL) ×3
ELECTRODE REM PT RTRN 9FT ADLT (ELECTROSURGICAL) ×1 IMPLANT
FORMALIN 10 PREFIL 120ML (MISCELLANEOUS) ×2 IMPLANT
GLOVE BIOGEL PI IND STRL 7.0 (GLOVE) IMPLANT
GLOVE BIOGEL PI IND STRL 8 (GLOVE) ×1 IMPLANT
GLOVE BIOGEL PI INDICATOR 7.0 (GLOVE) ×6
GLOVE BIOGEL PI INDICATOR 8 (GLOVE) ×2
GLOVE ECLIPSE 6.5 STRL STRAW (GLOVE) ×2 IMPLANT
GLOVE ECLIPSE 8.0 STRL XLNG CF (GLOVE) ×3 IMPLANT
GOWN STRL REUS W/TWL LRG LVL3 (GOWN DISPOSABLE) ×3 IMPLANT
GOWN STRL REUS W/TWL XL LVL3 (GOWN DISPOSABLE) ×3 IMPLANT
INST SET LAPROSCOPIC GYN AP (KITS) ×3 IMPLANT
KIT ROOM TURNOVER AP CYSTO (KITS) ×3 IMPLANT
MANIFOLD NEPTUNE II (INSTRUMENTS) ×3 IMPLANT
NDL INSUFFLATION 14GA 120MM (NEEDLE) ×1 IMPLANT
NEEDLE INSUFFLATION 14GA 120MM (NEEDLE) ×3 IMPLANT
PACK PERI GYN (CUSTOM PROCEDURE TRAY) ×3 IMPLANT
PAD ARMBOARD 7.5X6 YLW CONV (MISCELLANEOUS) ×3 IMPLANT
SCALPEL HARMONIC ACE (MISCELLANEOUS) ×3 IMPLANT
SET BASIN LINEN APH (SET/KITS/TRAYS/PACK) ×3 IMPLANT
SLEEVE ENDOPATH XCEL 5M (ENDOMECHANICALS) ×3 IMPLANT
SOLUTION ANTI FOG 6CC (MISCELLANEOUS) ×3 IMPLANT
SPONGE GAUZE 2X2 8PLY STER LF (GAUZE/BANDAGES/DRESSINGS) ×3
SPONGE GAUZE 2X2 8PLY STRL LF (GAUZE/BANDAGES/DRESSINGS) ×3 IMPLANT
STAPLER VISISTAT 35W (STAPLE) ×3 IMPLANT
SUT VICRYL 0 UR6 27IN ABS (SUTURE) ×3 IMPLANT
SYR 20CC LL (SYRINGE) ×3 IMPLANT
SYRINGE 10CC LL (SYRINGE) ×3 IMPLANT
TAPE CLOTH SURG 4X10 WHT LF (GAUZE/BANDAGES/DRESSINGS) ×2 IMPLANT
TROCAR ENDO BLADELESS 11MM (ENDOMECHANICALS) ×3 IMPLANT
TROCAR XCEL NON-BLD 5MMX100MML (ENDOMECHANICALS) ×3 IMPLANT
TUBING INSUF HEATED (TUBING) ×3 IMPLANT
WARMER LAPAROSCOPE (MISCELLANEOUS) ×3 IMPLANT

## 2015-02-25 NOTE — Op Note (Signed)
Preoperative Diagnosis:  1.  Multiparous female desires permanent sterilization                                          2.  Elects to have bilateral salpingectomy for ovarian cancer                                                     prophylaxis  Postoperative Diagnosis:  Same as above  Procedure:  Laparoscopic Bilateral Salpingectomy  Surgeon:  Rockne Coons MD  Anaesthesia: general  Findings:  Patient had normal pelvic anatomy and no intraperitoneal abnormalities.  Description of Operation:  Patient was taken to the OR and placed into supine position where she underwent general anaesthesia.  She was placed in the dorsal lithotomy position and prepped and draped in the usual sterile fashion.  An incision was made in the umbilicus and dissection taken down to the rectus fascia which was incised and opened.  The non bladed trocar was then placed and the peritoneal cavity was insufflated.  The above noted findings were observed.  Additional trocars were placed in the right and left lower quadrants under direct visualization without difficulty.  The Harmonic scalpel was employed and salpingectomy of both the right and left tubes was performed.   The tubes were removed from the peritoneal cavity and sent to pathology.  There was good hemostasis bilaterally.  The fascia, peritoneum and subcutaneous tissue were closed using 0 vicryl.  The skin was closed using staples.  Exparel 266 mg 20 cc was injected in the 3 incision trocar sites. The patient was awakened from anaesthesia and taken to the PACU with all counts being correct x 3.  The patient received  1 gram of ancef andToradol 30 mg IV preoperatively.  Arshi Duarte H 02/25/2015 10:57 AM

## 2015-02-25 NOTE — Anesthesia Postprocedure Evaluation (Signed)
  Anesthesia Post-op Note  Patient: Pamela Horn  Procedure(s) Performed: Procedure(s): LAPAROSCOPIC BILATERAL SALPINGECTOMY (Bilateral)  Patient Location: PACU  Anesthesia Type:General  Level of Consciousness: awake, alert , oriented and patient cooperative  Airway and Oxygen Therapy: Patient Spontanous Breathing and Patient connected to face mask oxygen  Post-op Pain: none  Post-op Assessment: Post-op Vital signs reviewed, Patient's Cardiovascular Status Stable, Respiratory Function Stable, Patent Airway, No signs of Nausea or vomiting and Pain level controlled              Post-op Vital Signs: Reviewed and stable  Last Vitals:  Filed Vitals:   02/25/15 1103  BP: 135/103  Pulse: 74  Temp:   Resp: 24    Complications: No apparent anesthesia complications

## 2015-02-25 NOTE — Anesthesia Preprocedure Evaluation (Signed)
Anesthesia Evaluation  Patient identified by MRN, date of birth, ID band Patient awake    Reviewed: Allergy & Precautions, NPO status , Unable to perform ROS - Chart review only  History of Anesthesia Complications Negative for: history of anesthetic complications  Airway Mallampati: II  TM Distance: >3 FB Neck ROM: Full    Dental  (+) Poor Dentition, Dental Advisory Given   Pulmonary Current Smoker,    Pulmonary exam normal       Cardiovascular hypertension, Normal cardiovascular exam    Neuro/Psych negative neurological ROS  negative psych ROS   GI/Hepatic negative GI ROS, Neg liver ROS,   Endo/Other  Morbid obesity  Renal/GU negative Renal ROS     Musculoskeletal negative musculoskeletal ROS (+)   Abdominal   Peds  Hematology   Anesthesia Other Findings   Reproductive/Obstetrics                             Anesthesia Physical Anesthesia Plan  ASA: II  Anesthesia Plan: General   Post-op Pain Management:    Induction: Intravenous  Airway Management Planned: Oral ETT  Additional Equipment:   Intra-op Plan:   Post-operative Plan: Extubation in OR  Informed Consent: I have reviewed the patients History and Physical, chart, labs and discussed the procedure including the risks, benefits and alternatives for the proposed anesthesia with the patient or authorized representative who has indicated his/her understanding and acceptance.     Plan Discussed with:   Anesthesia Plan Comments:         Anesthesia Quick Evaluation

## 2015-02-25 NOTE — Progress Notes (Signed)
Arousing. Asking for something to drink. Ice chips given. Tolerated well.

## 2015-02-25 NOTE — H&P (Signed)
Preoperative History and Physical  Pamela Horn is a 34 y.o. [phone] with No LMP recorded. admitted for a laparoscopic salpingectomy for permanent sterilization .  Pt did not want to have it performed at the time of her Caesarean section in case there was a problem with either of the twins  PMH:  Past Medical History  Diagnosis Date  . Pregnancy induced hypertension   . Pre-eclampsia     1st pregnancy    PSH:  Past Surgical History  Procedure Laterality Date  . Colposcopy  04/04/2000 & 10/03/2000  . Gynecologic cryosurgery  11/07/2000  . Cesarean section  10/18/1999  . Cesarean section N/A 12/02/2014    Procedure: CESAREAN SECTION (MULTIPLE) REPEAT; Surgeon: Lazaro Arms, MD; Location: [facility]; Service: Obstetrics; Laterality: N/A;    POb/GynH:  OB History    Gravida Para Term Preterm AB TAB SAB Ectopic Multiple Living   2 2 1 1     1 3       SH:  History  Substance Use Topics  . Smoking status: Current Every Day Smoker -- 1.00 packs/day for 20 years    Types: Cigarettes  . Smokeless tobacco: Never Used  . Alcohol Use: No    FH:  Family History  Problem Relation Age of Onset  . Hypertension Mother   . Cancer Father     melanoma  . Cancer Sister   . Diabetes Paternal Grandfather      Allergies: No Known Allergies  Medications:  Current outpatient prescriptions:  . desogestrel-ethinyl estradiol (APRI,EMOQUETTE,SOLIA) 0.15-30 MG-MCG tablet, Take 1 tablet by mouth daily., Disp: 1 Package, Rfl: 11 . folic acid (FOLVITE) 800 MCG tablet, Take 800 mcg by mouth daily., Disp: , Rfl:  . ibuprofen (ADVIL,MOTRIN) 600 MG tablet, Take 1 tablet (600 mg total) by mouth every 6 (six) hours. (Patient not taking: Reported on 12/09/2014), Disp: 30 tablet, Rfl: 0 . oxyCODONE-acetaminophen (PERCOCET/ROXICET) 5-325 MG per tablet, Take 1 tablet by mouth every  4 (four) hours as needed (for pain scale 4-7). (Patient not taking: Reported on 01/08/2015), Disp: 20 tablet, Rfl: 0 . Prenat-FeFum-FePo-FA-Omega 3 (CONCEPT DHA) 53.5-38-1 MG CAPS, Take 1 capsule by mouth daily. (Patient not taking: Reported on 01/08/2015), Disp: 30 capsule, Rfl: 11  Review of Systems:   Review of Systems  Constitutional: Negative for fever, chills, weight loss, malaise/fatigue and diaphoresis.  HENT: Negative for hearing loss, ear pain, nosebleeds, congestion, sore throat, neck pain, tinnitus and ear discharge.  Eyes: Negative for blurred vision, double vision, photophobia, pain, discharge and redness.  Respiratory: Negative for cough, hemoptysis, sputum production, shortness of breath, wheezing and stridor.  Cardiovascular: Negative for chest pain, palpitations, orthopnea, claudication, leg swelling and PND.  Gastrointestinal: Positive for abdominal pain. Negative for heartburn, nausea, vomiting, diarrhea, constipation, blood in stool and melena.  Genitourinary: Negative for dysuria, urgency, frequency, hematuria and flank pain.  Musculoskeletal: Negative for myalgias, back pain, joint pain and falls.  Skin: Negative for itching and rash.  Neurological: Negative for dizziness, tingling, tremors, sensory change, speech change, focal weakness, seizures, loss of consciousness, weakness and headaches.  Endo/Heme/Allergies: Negative for environmental allergies and polydipsia. Does not bruise/bleed easily.  Psychiatric/Behavioral: Negative for depression, suicidal ideas, hallucinations, memory loss and substance abuse. The patient is not nervous/anxious and does not have insomnia.     PHYSICAL EXAM:  Blood pressure 116/82, pulse 64, height 5\' 2"  (1.575 m), weight 178 lb (80.74 kg), unknown if currently breastfeeding.   Vitals reviewed. Constitutional: She is oriented to person,  place, and time. She appears well-developed and well-nourished.  HENT:  Head: Normocephalic  and atraumatic.  Right Ear: External ear normal.  Left Ear: External ear normal.  Nose: Nose normal.  Mouth/Throat: Oropharynx is clear and moist.  Eyes: Conjunctivae and EOM are normal. Pupils are equal, round, and reactive to light. Right eye exhibits no discharge. Left eye exhibits no discharge. No scleral icterus.  Neck: Normal range of motion. Neck supple. No tracheal deviation present. No thyromegaly present.  Cardiovascular: Normal rate, regular rhythm, normal heart sounds and intact distal pulses. Exam reveals no gallop and no friction rub.  No murmur heard. Respiratory: Effort normal and breath sounds normal. No respiratory distress. She has no wheezes. She has no rales. She exhibits no tenderness.  GI: Soft. Bowel sounds are normal. She exhibits no distension and no mass. There is tenderness. There is no rebound and no guarding.  Genitourinary:   Vulva is normal without lesions Vagina is pink moist without discharge Cervix normal in appearance and pap is normal Uterus is normal size, contour, position, consistency, mobility, non-tender Adnexa is negative with normal sized ovaries by sonogram  Musculoskeletal: Normal range of motion. She exhibits no edema and no tenderness.  Neurological: She is alert and oriented to person, place, and time. She has normal reflexes. She displays normal reflexes. No cranial nerve deficit. She exhibits normal muscle tone. Coordination normal.  Skin: Skin is warm and dry. No rash noted. No erythema. No pallor.  Psychiatric: She has a normal mood and affect. Her behavior is normal. Judgment and thought content normal.    Labs: No results found for this or any previous visit (from the past 336 hour(s)).  EKG: No orders found for this or any previous visit.  Imaging Studies:  Imaging Results    No results found.      Assessment: deisres permanent sterilization Patient Active Problem List   Diagnosis Date Noted  . Previous  cesarean section 12/02/2014  . Asymptomatic bacteriuria during pregnancy in first trimester 05/28/2014  . Supervision of other high-risk pregnancy 05/26/2014  . Dichorionic diamniotic twin gestation 05/26/2014  . History of cesarean delivery 05/26/2014  . Hx of preeclampsia, prior pregnancy, currently pregnant 05/26/2014  . AMA (advanced maternal age) multigravida 35+ 05/26/2014  . Smoker 05/26/2014    Plan: Laparoscopic salpingectomy bilateral  Manreet Kiernan H

## 2015-02-25 NOTE — Anesthesia Procedure Notes (Signed)
Procedure Name: Intubation Date/Time: 02/25/2015 10:17 AM Performed by: Pernell Dupre, Clotile Whittington A Pre-anesthesia Checklist: Patient identified, Patient being monitored, Timeout performed, Emergency Drugs available and Suction available Patient Re-evaluated:Patient Re-evaluated prior to inductionOxygen Delivery Method: Circle System Utilized Preoxygenation: Pre-oxygenation with 100% oxygen Intubation Type: IV induction Ventilation: Mask ventilation without difficulty Laryngoscope Size: 3 and Miller Grade View: Grade I Tube type: Oral Tube size: 7.0 mm Number of attempts: 1 Airway Equipment and Method: Stylet Placement Confirmation: ETT inserted through vocal cords under direct vision,  positive ETCO2 and breath sounds checked- equal and bilateral Secured at: 21 cm Tube secured with: Tape Dental Injury: Teeth and Oropharynx as per pre-operative assessment

## 2015-02-25 NOTE — Discharge Instructions (Signed)
Laparoscopic Tubal Ligation Care After Refer to this sheet in the next few weeks. These instructions provide you with information on caring for yourself after your procedure. Your caregiver may also give you more specific instructions. Your treatment has been planned according to current medical practices, but problems sometimes occur. Call your caregiver if you have any problems or questions after your procedure. HOME CARE INSTRUCTIONS   Rest the remainder of the day.  Only take over-the-counter or prescription medicines for pain, discomfort, or fever as directed by your caregiver. Do not take aspirin. It can cause bleeding.  Gradually resume daily activities, diet, rest, driving, and work.  Avoid sexual intercourse for 2 weeks or as directed.  Do not use tampons or douche.  Do not drive while taking pain medicine.  Do not lift anything over 5 pounds for 2 weeks or as directed.  Only take showers, not baths, until you are seen by your caregiver.  Change bandages (dressings) as directed.  Take your temperature twice a day and record it.  Try to have help for the first 7 to 10 days for your household needs.  Return to your caregiver to get your stitches (sutures) removed and for follow-up visits as directed. SEEK MEDICAL CARE IF:   You have redness, swelling, or increasing pain in a wound.  You have drainage from a wound lasting longer than 1 day.  Your pain is getting worse.  You have a rash.  You become dizzy or lightheaded.  You have a reaction to your medicine.  You need stronger medicine or a change in your pain medicine.  You notice a bad smell coming from a wound or dressing.  Your wound breaks open after the sutures have been removed.  You are constipated. SEEK IMMEDIATE MEDICAL CARE IF:   You faint.  You have a fever.  You have increasing abdominal pain.  You have severe pain in your shoulders.  You have bleeding or drainage from the suture sites or  vagina following surgery.  You have shortness of breath or difficulty breathing.  You have chest or leg pain.  You have persistent nausea, vomiting, or diarrhea. MAKE SURE YOU:   Understand these instructions.  Watch your condition.  Get help right away if you are not doing well or get worse. Document Released: 01/14/2005 Document Revised: 12/27/2011 Document Reviewed: 10/08/2011 [facility] Patient Information 2015 [place], [place]. This information is not intended to replace advice given to you by your health care provider. Make sure you discuss any questions you have with your health care provider.  PATIENT INSTRUCTIONS POST-ANESTHESIA  IMMEDIATELY FOLLOWING SURGERY:  Do not drive or operate machinery for the first twenty four hours after surgery.  Do not make any important decisions for twenty four hours after surgery or while taking narcotic pain medications or sedatives.  If you develop intractable nausea and vomiting or a severe headache please notify your doctor immediately.  FOLLOW-UP:  Please make an appointment with your surgeon as instructed. You do not need to follow up with anesthesia unless specifically instructed to do so.  WOUND CARE INSTRUCTIONS (if applicable):  Keep a dry clean dressing on the anesthesia/puncture wound site if there is drainage.  Once the wound has quit draining you may leave it open to air.  Generally you should leave the bandage intact for twenty four hours unless there is drainage.  If the epidural site drains for more than 36-48 hours please call the anesthesia department.  QUESTIONS?:  Please feel free  to call your physician or the hospital operator if you have any questions, and they will be happy to assist you.

## 2015-02-25 NOTE — Transfer of Care (Signed)
Immediate Anesthesia Transfer of Care Note  Patient: Pamela Horn  Procedure(s) Performed: Procedure(s): LAPAROSCOPIC BILATERAL SALPINGECTOMY (Bilateral)  Patient Location: PACU  Anesthesia Type:General  Level of Consciousness: awake, alert , oriented and patient cooperative  Airway & Oxygen Therapy: Patient Spontanous Breathing and Patient connected to face mask oxygen  Post-op Assessment: Report given to RN and Post -op Vital signs reviewed and stable  Post vital signs: Reviewed and stable  Last Vitals:  Filed Vitals:   02/25/15 1000  BP: 150/93  Pulse:   Temp:   Resp: 13    Complications: No apparent anesthesia complications

## 2015-02-26 ENCOUNTER — Encounter (HOSPITAL_COMMUNITY): Payer: Self-pay | Admitting: Obstetrics & Gynecology

## 2015-03-05 ENCOUNTER — Ambulatory Visit (INDEPENDENT_AMBULATORY_CARE_PROVIDER_SITE_OTHER): Payer: Self-pay | Admitting: Obstetrics & Gynecology

## 2015-03-05 ENCOUNTER — Encounter: Payer: Self-pay | Admitting: Obstetrics & Gynecology

## 2015-03-05 VITALS — BP 110/70 | HR 80 | Wt 178.0 lb

## 2015-03-05 DIAGNOSIS — Z9889 Other specified postprocedural states: Secondary | ICD-10-CM

## 2015-03-05 NOTE — Progress Notes (Signed)
Patient ID: Pamela Horn, female   DOB: 16-Apr-1981, 34 y.o.   MRN: [id number]  HPI: Patient returns for routine postoperative follow-up having undergone 02/25/2015 on laparoscopic salpingectomy.  The patient's immediate postoperative recovery has been unremarkable. Since hospital discharge the patient reports no problems.   Current Outpatient Prescriptions: Prenat-FeFum-FePo-FA-Omega 3 (CONCEPT DHA) 53.5-38-1 MG CAPS, Take 1 capsule by mouth daily., Disp: 30 capsule, Rfl: 11 desogestrel-ethinyl estradiol (APRI,EMOQUETTE,SOLIA) 0.15-30 MG-MCG tablet, Take 1 tablet by mouth daily. (Patient not taking: Reported on 03/05/2015), Disp: 1 Package, Rfl: 11 ibuprofen (ADVIL,MOTRIN) 600 MG tablet, Take 1 tablet (600 mg total) by mouth every 6 (six) hours. (Patient not taking: Reported on 12/09/2014), Disp: 30 tablet, Rfl: 0 ketorolac (TORADOL) 10 MG tablet, Take 1 tablet (10 mg total) by mouth every 8 (eight) hours as needed. (Patient not taking: Reported on 03/05/2015), Disp: 15 tablet, Rfl: 0 ondansetron (ZOFRAN) 8 MG tablet, Take 1 tablet (8 mg total) by mouth every 8 (eight) hours as needed for nausea. (Patient not taking: Reported on 03/05/2015), Disp: 12 tablet, Rfl: 0 oxyCODONE-acetaminophen (PERCOCET/ROXICET) 5-325 MG per tablet, Take 1 tablet by mouth every 4 (four) hours as needed (for pain scale 4-7). (Patient not taking: Reported on 01/08/2015), Disp: 20 tablet, Rfl: 0 oxyCODONE-acetaminophen (ROXICET) 5-325 MG per tablet, Take 1-2 tablets by mouth every 4 (four) hours as needed for severe pain. (Patient not taking: Reported on 03/05/2015), Disp: 30 tablet, Rfl: 0  No current facility-administered medications for this visit.    Blood pressure 110/70, pulse 80, weight 178 lb (80.74 kg), last menstrual period 02/12/2015, not currently breastfeeding.  Physical Exam: Incision x 3 clear  Diagnostic Tests:   Pathology: benign  Impression: Normal post op course  Plan:   Follow up: 1   years  Lazaro Arms, MD

## 2016-11-06 ENCOUNTER — Encounter (HOSPITAL_COMMUNITY): Payer: Self-pay | Admitting: *Deleted

## 2016-11-06 ENCOUNTER — Emergency Department (HOSPITAL_COMMUNITY)
Admission: EM | Admit: 2016-11-06 | Discharge: 2016-11-07 | Disposition: A | Discharge status: Home / Self Care | Payer: Medicaid Other | Attending: Emergency Medicine | Admitting: Emergency Medicine

## 2016-11-06 DIAGNOSIS — F17210 Nicotine dependence, cigarettes, uncomplicated: Secondary | ICD-10-CM | POA: Insufficient documentation

## 2016-11-06 DIAGNOSIS — Y929 Unspecified place or not applicable: Secondary | ICD-10-CM | POA: Diagnosis not present

## 2016-11-06 DIAGNOSIS — S50861A Insect bite (nonvenomous) of right forearm, initial encounter: Secondary | ICD-10-CM | POA: Diagnosis present

## 2016-11-06 DIAGNOSIS — W57XXXA Bitten or stung by nonvenomous insect and other nonvenomous arthropods, initial encounter: Secondary | ICD-10-CM | POA: Insufficient documentation

## 2016-11-06 DIAGNOSIS — Y999 Unspecified external cause status: Secondary | ICD-10-CM | POA: Insufficient documentation

## 2016-11-06 DIAGNOSIS — Y939 Activity, unspecified: Secondary | ICD-10-CM | POA: Diagnosis not present

## 2016-11-06 DIAGNOSIS — R509 Fever, unspecified: Secondary | ICD-10-CM | POA: Diagnosis not present

## 2016-11-06 NOTE — ED Triage Notes (Signed)
Pt states Friday am she noticed a small bump to her right forearm and since then it has gotten bigger and has a burning sensation;

## 2016-11-06 NOTE — ED Provider Notes (Signed)
[facility] Provider Note   CSN: [id number] Arrival date & time: 11/06/16  2325   By signing my name below, I, Bobbie Stack, attest that this documentation has been prepared under the direction and in the presence of Devoria Albe, MD. Electronically Signed: Bobbie Stack, Scribe. 11/07/16. 12:14 AM.  Time seen: 11:58 PM  History   Chief Complaint Chief Complaint  Patient presents with  . Insect Bite    The history is provided by the patient. No language interpreter was used.  HPI Comments: Pamela Horn is a 36 y.o. female who presents to the Emergency Department complaining of a small bump on her right arm. The patient believes that she was bit by a spider. She states that she woke up at 3 am on April 27 to use the bathroom  and noticed that her arm was itching around the area. She states there was a small raised normal skin color area "like a mosquito bite". She began to notice redness and a burning sensation around the area yesterday. At first she thought it was poison ivy because she has been doing a lot of yard work.  She reports having a subjective fever yesterday. She states "I had spells of feeling hot throughout the entire day yesterday". She states that she took 2 pills of keflex yesterday and 1 pill of keflex today which she thinks has helped, although the redness area is larger. She also states it has been draining yellow clear fluid. She states now it is burning "like it is on fire inside". She reports she lives in an [place] house [place] a [place] and she has lots of spiders in her house. She had her house sprayed for spiders about 2 weeks ago. . She states that she was bitten by a brown recluse in the past and had boils appear around the area of the bite. She currently smokes 1 pack a day. She is not on any medications on a daily basis. She denies nausea, vomiting, documented fever, red streaks or any headaches.  PCP Milana Obey, MD   Past Medical History:    Diagnosis Date  . Pre-eclampsia    1st pregnancy  . Pregnancy induced hypertension     Patient Active Problem List   Diagnosis Date Noted  . Previous cesarean section 12/02/2014  . Asymptomatic bacteriuria during pregnancy in first trimester 05/28/2014  . Supervision of other high-risk pregnancy 05/26/2014  . Dichorionic diamniotic twin gestation 05/26/2014  . History of cesarean delivery 05/26/2014  . Hx of preeclampsia, prior pregnancy, currently pregnant 05/26/2014  . AMA (advanced maternal age) multigravida 35+ 05/26/2014  . Smoker 05/26/2014    Past Surgical History:  Procedure Laterality Date  . CESAREAN SECTION  10/18/1999  . CESAREAN SECTION N/A 12/02/2014   Procedure: CESAREAN SECTION (MULTIPLE) REPEAT;  Surgeon: Lazaro Arms, MD;  Location: [facility];  Service: Obstetrics;  Laterality: N/A;  . COLPOSCOPY  04/04/2000 & 10/03/2000  . GYNECOLOGIC CRYOSURGERY  11/07/2000  . LAPAROSCOPIC BILATERAL SALPINGECTOMY Bilateral 02/25/2015   Procedure: LAPAROSCOPIC BILATERAL SALPINGECTOMY;  Surgeon: Lazaro Arms, MD;  Location: [facility];  Service: Gynecology;  Laterality: Bilateral;    OB History    Gravida Para Term Preterm AB Living   SAB TAB Ectopic Multiple Live Births         1 1       Home Medications    Prior to Admission medications   Medication Sig Start  Date End Date Taking? Authorizing Provider  sulfamethoxazole-trimethoprim (BACTRIM DS,SEPTRA DS) 800-160 MG tablet Take 1 tablet by mouth 2 (two) times daily. 11/07/16   Devoria Albe, MD    Family History Family History  Problem Relation Age of Onset  . Hypertension Mother   . Cancer Father     melanoma  . Cancer Sister   . Diabetes Paternal Grandfather     Social History Social History  Substance Use Topics  . Smoking status: Current Every Day Smoker    Packs/day: 1.00    Years: 20.00    Types: Cigarettes  . Smokeless tobacco: Never Used  . Alcohol use No  employed   Allergies   Patient  has no known allergies.   Review of Systems Review of Systems  Constitutional: Positive for fever.  Respiratory: Negative for shortness of breath.   Gastrointestinal: Negative for nausea and vomiting.  Musculoskeletal: Positive for myalgias.       Pain in her left arm.  Skin: Positive for color change.  All other systems reviewed and are negative.    Physical Exam Updated Vital Signs BP 138/89 (BP Location: Left Arm)   Pulse 78   Temp 98.1 F (36.7 C) (Oral)   Resp 17   Ht  (1.575 m)   Wt 160 lb (72.6 kg)   LMP 10/29/2016   SpO2 100%   BMI 29.26 kg/m   Vital signs normal    Physical Exam  Constitutional: She is oriented to person, place, and time. She appears well-developed and well-nourished.  Non-toxic appearance. She does not appear ill. No distress.  HENT:  Head: Normocephalic and atraumatic.  Right Ear: External ear normal.  Left Ear: External ear normal.  Nose: Nose normal.  Eyes: Conjunctivae and EOM are normal.  Neck: Normal range of motion and full passive range of motion without pain.  Cardiovascular: Normal rate.   Pulmonary/Chest: Effort normal. No respiratory distress. She has no rhonchi. She exhibits no crepitus.  Abdominal: Normal appearance.  Musculoskeletal: Normal range of motion. She exhibits no edema or tenderness.  Moves all extremities well.   Neurological: She is alert and oriented to person, place, and time. She has normal strength. No cranial nerve deficit.  Skin: Skin is warm, dry and intact. There is erythema. No pallor.  Right forearm with 5x6 area of rednessredness. In the center is a superficial ulcer surrounded by clear fluid filled vesicles. It appears to have been draining. On palpation there is no definite localized induration in the subcutaneous tissues. No red streaks seen. No epitrochlear lymphnodes  Psychiatric: She has a normal mood and affect. Her speech is normal and behavior is normal. Her mood appears not anxious.    Nursing note and vitals reviewed.      ED Treatments / Results  DIAGNOSTIC STUDIES: Oxygen Saturation is 100% on RA, normal by my interpretation.    Labs (all labs ordered are listed, but only abnormal results are displayed) Labs Reviewed - No data to display  EKG  EKG Interpretation None       Radiology No results found.  Procedures Procedures (including critical care time)  Medications Ordered in ED Medications  sulfamethoxazole-trimethoprim (BACTRIM DS,SEPTRA DS) 800-160 MG per tablet 1 tablet (1 tablet Oral Given 11/07/16 0040)     Initial Impression / Assessment and Plan / ED Course  I have reviewed the triage vital signs and the nursing notes.  Pertinent labs & imaging results that were available during my care of the patient  were reviewed by me and considered in my medical decision making (see chart for details).    COORDINATION OF CARE: 12:00 AM Discussed treatment plan with pt at bedside, which includes labs, and pt agreed to plan. I will start the patient on some antibiotics.  Patient had a insect bite that started out with intense pruritus. She now appears to have an infected area on her right forearm. She was started on Septra DS for possible MRSA infection. She should soak it in warm Epsom salts and use ice packs for comfort. She should be re-checked if she starts seeing a red streak she gets documented fever or chills or the area of redness seems to be spreading. At this point I am unable to tell if it is a spider bite or other type of insect. Currently it does not appear to be a brown recluse spider bite because it does not have a black center.   Final Clinical Impressions(s) / ED Diagnoses   Final diagnoses:  Insect bite, initial encounter    New Prescriptions Discharge Medication List as of 11/07/2016 12:21 AM    START taking these medications   Details  sulfamethoxazole-trimethoprim (BACTRIM DS,SEPTRA DS) 800-160 MG tablet Take 1 tablet by  mouth 2 (two) times daily., Starting Mon 11/07/2016, Print       Plan discharge  Devoria Albe, MD, FACEP  I personally performed the services described in this documentation, which was scribed in my presence. The recorded information has been reviewed and considered.  Devoria Albe, MD, Concha Pyo, MD 11/07/16 [phone]

## 2016-11-07 MED ORDER — SULFAMETHOXAZOLE-TRIMETHOPRIM 800-160 MG PO TABS: 1.0000 | ORAL_TABLET | Freq: Two times a day (BID) | ORAL | 0 refills | Status: DC

## 2016-11-07 MED ORDER — SULFAMETHOXAZOLE-TRIMETHOPRIM 800-160 MG PO TABS
1.0000 | ORAL_TABLET | Freq: Once | ORAL | Status: AC
  Administered 2016-11-07: 1 via ORAL
  Filled 2016-11-07: qty 1

## 2016-11-07 NOTE — ED Notes (Signed)
Pt ambulatory to waiting room. Pt verbalized understanding of discharge instructions.   

## 2016-11-07 NOTE — Discharge Instructions (Signed)
Use ice packs for comfort. Soak in warm epsom salts to help draw out infection. Take the antibiotics until gone. You can take ibuprofen 600 mg + acetaminophen 650 mg 4 times a day for pain or fever. Recheck if you get a high fever, you see a red streak running up your arm, the area of redness gets a lot bigger, you have uncontrolled vomiting or you feel a lot worse.

## 2018-04-24 ENCOUNTER — Ambulatory Visit (INDEPENDENT_AMBULATORY_CARE_PROVIDER_SITE_OTHER): Payer: Medicaid Other | Admitting: Adult Health

## 2018-04-24 ENCOUNTER — Encounter: Payer: Self-pay | Admitting: Adult Health

## 2018-04-24 ENCOUNTER — Other Ambulatory Visit (HOSPITAL_COMMUNITY)
Admission: RE | Admit: 2018-04-24 | Discharge: 2018-04-24 | Disposition: A | Discharge status: Home / Self Care | Payer: Medicaid Other | Source: Ambulatory Visit | Attending: Adult Health | Admitting: Adult Health

## 2018-04-24 VITALS — BP 134/89 | HR 76 | Ht 62.0 in | Wt 186.5 lb

## 2018-04-24 DIAGNOSIS — Z309 Encounter for contraceptive management, unspecified: Secondary | ICD-10-CM | POA: Diagnosis not present

## 2018-04-24 DIAGNOSIS — Z01419 Encounter for gynecological examination (general) (routine) without abnormal findings: Secondary | ICD-10-CM | POA: Insufficient documentation

## 2018-04-24 DIAGNOSIS — Z3009 Encounter for other general counseling and advice on contraception: Secondary | ICD-10-CM | POA: Diagnosis not present

## 2018-04-24 DIAGNOSIS — R6882 Decreased libido: Secondary | ICD-10-CM | POA: Diagnosis not present

## 2018-04-24 DIAGNOSIS — Z113 Encounter for screening for infections with a predominantly sexual mode of transmission: Secondary | ICD-10-CM | POA: Diagnosis not present

## 2018-04-24 NOTE — Progress Notes (Signed)
Patient ID: Pamela Horn, female   DOB: 1981/05/22, 37 y.o.   MRN: [id number] History of Present Illness: Pamela Horn is a 37 year old white female, engaged, G2P3, in for a well woman gyn exam and pap.  PCP is Dr Sudie Bailey.    Current Medications, Allergies, Past Medical History, Past Surgical History, Family History and Social History were reviewed in [employer] record.     Review of Systems: Patient denies any headaches, hearing loss, fatigue, blurred vision, shortness of breath, chest pain, abdominal pain, problems with bowel movements, urination, or intercourse. No joint pain or mood swings. Decreased libido, since getting pregnant with the twins.  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    Physical Exam:  BP 134/89 (BP Location: Left Arm, Patient Position: Sitting, Cuff Size: Normal)   Pulse 76   Ht 5\' 2"  (1.575 m)   Wt 186 lb 8 oz (84.6 kg)   LMP 04/07/2018   BMI 34.11 kg/m  General:  Well developed, well nourished, no acute distress Skin:  Warm and dry,numberous tattoos and piercing's  Neck:  Midline trachea, normal thyroid, good ROM, no lymphadenopathy Lungs; Clear to auscultation bilaterally Breast:  No dominant palpable mass, retraction, or nipple discharge Cardiovascular: Regular rate and rhythm Abdomen:  Soft, non tender, no hepatosplenomegaly Pelvic:  External genitalia is normal in appearance, no lesions.  The vagina is normal in appearance. Urethra has no lesions or masses. The cervix is bulbous. Pap with HPV and GC/CHL performed. Uterus is felt to be normal size, shape, and contour.  No adnexal masses or tenderness noted.Bladder is non tender, no masses felt. Extremities/musculoskeletal:  No swelling or varicosities noted, no clubbing or cyanosis Psych:  No mood changes, alert and cooperative,seems happy pHQ 2 score 0. Examination chaperoned by Malachy Mood LPN. [employer].   Impression: 1. Encounter for gynecological examination  with Papanicolaou smear of cervix   2. Family planning   3. Screening examination for STD (sexually transmitted disease)   4. Decreased libido       Plan: Pap with HPV and GC/CHL sent Check HIV and RPR Physical in 1 year Pap in 3 if normal

## 2018-04-25 LAB — HIV ANTIBODY (ROUTINE TESTING W REFLEX): HIV Screen 4th Generation wRfx: NONREACTIVE

## 2018-04-25 LAB — RPR: RPR Ser Ql: NONREACTIVE

## 2018-04-26 LAB — CYTOLOGY - PAP
Chlamydia: NEGATIVE
Diagnosis: NEGATIVE
HPV (WINDOPATH): NOT DETECTED
NEISSERIA GONORRHEA: NEGATIVE

## 2018-07-07 ENCOUNTER — Other Ambulatory Visit: Payer: Self-pay

## 2018-07-07 ENCOUNTER — Encounter (HOSPITAL_COMMUNITY): Payer: Self-pay | Admitting: Emergency Medicine

## 2018-07-07 ENCOUNTER — Emergency Department (HOSPITAL_COMMUNITY)
Admission: EM | Admit: 2018-07-07 | Discharge: 2018-07-07 | Disposition: A | Discharge status: Home / Self Care | Payer: Medicaid Other | Attending: Emergency Medicine | Admitting: Emergency Medicine

## 2018-07-07 DIAGNOSIS — F17210 Nicotine dependence, cigarettes, uncomplicated: Secondary | ICD-10-CM | POA: Insufficient documentation

## 2018-07-07 DIAGNOSIS — L03115 Cellulitis of right lower limb: Secondary | ICD-10-CM | POA: Insufficient documentation

## 2018-07-07 MED ORDER — SULFAMETHOXAZOLE-TRIMETHOPRIM 800-160 MG PO TABS
1.0000 | ORAL_TABLET | Freq: Once | ORAL | Status: AC
Start: 2018-07-07 — End: 2018-07-07
  Administered 2018-07-07: 1 via ORAL
  Filled 2018-07-07: qty 1

## 2018-07-07 MED ORDER — SULFAMETHOXAZOLE-TRIMETHOPRIM 800-160 MG PO TABS: 1.0000 | ORAL_TABLET | Freq: Two times a day (BID) | ORAL | 0 refills | Status: AC

## 2018-07-07 NOTE — Discharge Instructions (Signed)
As discussed, your evaluation today has been largely reassuring.  But, it is important that you monitor your condition carefully, and do not hesitate to return to the ED if you develop new, or concerning changes in your condition. ? ?Otherwise, please follow-up with your physician for appropriate ongoing care. ? ?

## 2018-07-07 NOTE — ED Provider Notes (Signed)
[facility] EMERGENCY DEPARTMENT Provider Note   CSN: [id number] Arrival date & time: 07/07/18  1929     History   Chief Complaint Chief Complaint  Patient presents with  . Insect Bite    R upper inner thigh    HPI Pamela Horn is a 37 y.o. female.  HPI Patient presents with concern of pain, swelling in her right medial upper thigh. Onset was 2 days ago, since onset was a focal lesion less than 1 cm is grown to be a roughly circular erythematous lesion approximately 15 cm in diameter. There is associated sharp pain in the area, no fever, no vomiting, no nausea, no other complaints. Patient took 2 doses of leftover antibiotic, cephalosporin, without change in her condition.  Past Medical History:  Diagnosis Date  . Pre-eclampsia    1st pregnancy  . Pregnancy induced hypertension     Patient Active Problem List   Diagnosis Date Noted  . Encounter for gynecological examination with Papanicolaou smear of cervix 04/24/2018  . Family planning 04/24/2018  . Screening examination for STD (sexually transmitted disease) 04/24/2018  . Decreased libido 04/24/2018  . Previous cesarean section 12/02/2014  . Asymptomatic bacteriuria during pregnancy in first trimester 05/28/2014  . Supervision of other high-risk pregnancy 05/26/2014  . Dichorionic diamniotic twin gestation 05/26/2014  . History of cesarean delivery 05/26/2014  . Hx of preeclampsia, prior pregnancy, currently pregnant 05/26/2014  . AMA (advanced maternal age) multigravida 35+ 05/26/2014  . Smoker 05/26/2014    Past Surgical History:  Procedure Laterality Date  . CESAREAN SECTION  10/18/1999  . CESAREAN SECTION N/A 12/02/2014   Procedure: CESAREAN SECTION (MULTIPLE) REPEAT;  Surgeon: Lazaro ArmsLuther H Eure, MD;  Location: [facility];  Service: Obstetrics;  Laterality: N/A;  . COLPOSCOPY  04/04/2000 & 10/03/2000  . GYNECOLOGIC CRYOSURGERY  11/07/2000  . LAPAROSCOPIC BILATERAL SALPINGECTOMY Bilateral 02/25/2015   Procedure:  LAPAROSCOPIC BILATERAL SALPINGECTOMY;  Surgeon: Lazaro ArmsLuther H Eure, MD;  Location: [facility];  Service: Gynecology;  Laterality: Bilateral;     OB History    Gravida  2   Para  2   Term  1   Preterm  1   AB      Living  3     SAB      TAB      Ectopic      Multiple  1   Live Births  1            Home Medications    Prior to Admission medications   Medication Sig Start Date End Date Taking? Authorizing Provider  Buprenorphine HCl-Naloxone HCl 8-2 MG FILM PLACE 1/4 OF A FILM UNDER THE TONGUE BID 03/27/18   [provider]    Family History Family History  Problem Relation Age of Onset  . Hypertension Mother   . Cancer Father        melanoma  . Cancer Sister   . Diabetes Paternal Grandfather     Social History Social History   Tobacco Use  . Smoking status: Current Every Day Smoker    Packs/day: 1.00    Years: 24.00    Pack years: 24.00    Types: Cigarettes  . Smokeless tobacco: Never Used  Substance Use Topics  . Alcohol use: No  . Drug use: No     Allergies   Patient has no known allergies.   Review of Systems Review of Systems  Constitutional:       Per HPI, otherwise negative  HENT:  Per HPI, otherwise negative  Respiratory:       Per HPI, otherwise negative  Cardiovascular:       Per HPI, otherwise negative  Gastrointestinal: Negative for vomiting.  Endocrine:       Negative aside from HPI  Genitourinary:       Neg aside from HPI   Musculoskeletal:       Per HPI, otherwise negative  Skin: Positive for color change and wound.  Neurological: Negative for syncope.     Physical Exam Updated Vital Signs BP 138/86 (BP Location: Right Arm)   Pulse 89   Temp 98.2 F (36.8 C) (Oral)   Resp 18   Ht 5\' 2"  (1.575 m)   Wt 84.8 kg   LMP 07/01/2018   SpO2 98%   BMI 34.20 kg/m   Physical Exam Vitals signs and nursing note reviewed.  Constitutional:      General: She is not in acute distress.    Appearance: She is  well-developed.  HENT:     Head: Normocephalic and atraumatic.  Eyes:     Conjunctiva/sclera: Conjunctivae normal.  Cardiovascular:     Rate and Rhythm: Normal rate and regular rhythm.     Pulses: Normal pulses.  Pulmonary:     Effort: Pulmonary effort is normal. No respiratory distress.  Abdominal:     General: There is no distension.  Skin:    General: Skin is warm and dry.       Neurological:     Mental Status: She is alert and oriented to person, place, and time.     Cranial Nerves: No cranial nerve deficit.      ED Treatments / Results   Procedures Procedures (including critical care time)  Medications Ordered in ED Medications  sulfamethoxazole-trimethoprim (BACTRIM DS,SEPTRA DS) 800-160 MG per tablet 1 tablet (1 tablet Oral Given 07/07/18 2056)     Initial Impression / Assessment and Plan / ED Course  I have reviewed the triage vital signs and the nursing notes.  Pertinent labs & imaging results that were available during my care of the patient were reviewed by me and considered in my medical decision making (see chart for details).  Healthy young female presents with isolated skin lesion consistent with cellulitis There is a focal area, possible origination of the wound, concerning for folliculitis No systemic complaints, no evidence for bacteremia, sepsis. After conversation on home care, importance of different antibiotics, return precautions, the patient was discharged in stable condition.  Final Clinical Impressions(s) / ED Diagnoses  Cellulitis of right lower extremity, initial encounter   Gerhard MunchLockwood, Jahrell Hamor, MD 07/07/18 2059

## 2018-07-07 NOTE — ED Triage Notes (Signed)
Pt reports what she believes is a spider bite that hurts and burns to her upper inner R thigh since yesterday  Tried home antibiotics but did not help

## 2019-04-20 ENCOUNTER — Inpatient Hospital Stay (HOSPITAL_COMMUNITY)
Admission: EM | Admit: 2019-04-20 | Discharge: 2019-04-24 | DRG: 872 | Disposition: A | Discharge status: Home / Self Care | Payer: Medicaid Other | Attending: Internal Medicine | Admitting: Internal Medicine

## 2019-04-20 ENCOUNTER — Other Ambulatory Visit: Payer: Self-pay

## 2019-04-20 ENCOUNTER — Emergency Department (HOSPITAL_COMMUNITY): Payer: Medicaid Other

## 2019-04-20 ENCOUNTER — Inpatient Hospital Stay (HOSPITAL_COMMUNITY): Payer: Medicaid Other

## 2019-04-20 ENCOUNTER — Encounter (HOSPITAL_COMMUNITY): Payer: Self-pay | Admitting: *Deleted

## 2019-04-20 DIAGNOSIS — Z8249 Family history of ischemic heart disease and other diseases of the circulatory system: Secondary | ICD-10-CM | POA: Diagnosis not present

## 2019-04-20 DIAGNOSIS — F1199 Opioid use, unspecified with unspecified opioid-induced disorder: Secondary | ICD-10-CM | POA: Diagnosis not present

## 2019-04-20 DIAGNOSIS — N132 Hydronephrosis with renal and ureteral calculous obstruction: Secondary | ICD-10-CM | POA: Diagnosis present

## 2019-04-20 DIAGNOSIS — Q6211 Congenital occlusion of ureteropelvic junction: Secondary | ICD-10-CM

## 2019-04-20 DIAGNOSIS — Z20828 Contact with and (suspected) exposure to other viral communicable diseases: Secondary | ICD-10-CM | POA: Diagnosis not present

## 2019-04-20 DIAGNOSIS — A4151 Sepsis due to Escherichia coli [E. coli]: Principal | ICD-10-CM | POA: Diagnosis present

## 2019-04-20 DIAGNOSIS — F17200 Nicotine dependence, unspecified, uncomplicated: Secondary | ICD-10-CM | POA: Diagnosis not present

## 2019-04-20 DIAGNOSIS — F1190 Opioid use, unspecified, uncomplicated: Secondary | ICD-10-CM | POA: Diagnosis not present

## 2019-04-20 DIAGNOSIS — N200 Calculus of kidney: Secondary | ICD-10-CM | POA: Diagnosis present

## 2019-04-20 DIAGNOSIS — Z881 Allergy status to other antibiotic agents status: Secondary | ICD-10-CM | POA: Diagnosis not present

## 2019-04-20 DIAGNOSIS — N136 Pyonephrosis: Secondary | ICD-10-CM | POA: Diagnosis present

## 2019-04-20 DIAGNOSIS — N12 Tubulo-interstitial nephritis, not specified as acute or chronic: Secondary | ICD-10-CM | POA: Diagnosis not present

## 2019-04-20 DIAGNOSIS — F17210 Nicotine dependence, cigarettes, uncomplicated: Secondary | ICD-10-CM | POA: Diagnosis present

## 2019-04-20 DIAGNOSIS — Z79899 Other long term (current) drug therapy: Secondary | ICD-10-CM

## 2019-04-20 DIAGNOSIS — R6520 Severe sepsis without septic shock: Secondary | ICD-10-CM | POA: Diagnosis not present

## 2019-04-20 DIAGNOSIS — Z936 Other artificial openings of urinary tract status: Secondary | ICD-10-CM | POA: Diagnosis not present

## 2019-04-20 DIAGNOSIS — D72829 Elevated white blood cell count, unspecified: Secondary | ICD-10-CM | POA: Diagnosis not present

## 2019-04-20 DIAGNOSIS — B9620 Unspecified Escherichia coli [E. coli] as the cause of diseases classified elsewhere: Secondary | ICD-10-CM | POA: Diagnosis present

## 2019-04-20 DIAGNOSIS — N1330 Unspecified hydronephrosis: Secondary | ICD-10-CM | POA: Diagnosis not present

## 2019-04-20 DIAGNOSIS — F1111 Opioid abuse, in remission: Secondary | ICD-10-CM | POA: Diagnosis present

## 2019-04-20 DIAGNOSIS — Z1629 Resistance to other single specified antibiotic: Secondary | ICD-10-CM | POA: Diagnosis not present

## 2019-04-20 LAB — URINALYSIS, ROUTINE W REFLEX MICROSCOPIC
Bilirubin Urine: NEGATIVE
Glucose, UA: NEGATIVE mg/dL
Ketones, ur: NEGATIVE mg/dL
Nitrite: NEGATIVE
Protein, ur: 30 mg/dL — AB
Specific Gravity, Urine: 1.018 (ref 1.005–1.030)
WBC, UA: >50 WBC/hpf — ABNORMAL HIGH (ref 0–5)
pH: 5 (ref 5.0–8.0)

## 2019-04-20 LAB — CBC WITH DIFFERENTIAL/PLATELET
Abs Immature Granulocytes: 0.05 10*3/uL (ref 0.00–0.07)
Basophils Absolute: 0 10*3/uL (ref 0.0–0.1)
Basophils Relative: 0 %
Eosinophils Absolute: 0 10*3/uL (ref 0.0–0.5)
Eosinophils Relative: 0 %
HCT: 37.5 % (ref 36.0–46.0)
Hemoglobin: 11.6 g/dL — ABNORMAL LOW (ref 12.0–15.0)
Immature Granulocytes: 0 %
Lymphocytes Relative: 11 %
Lymphs Abs: 1.4 10*3/uL (ref 0.7–4.0)
MCH: 25.8 pg — ABNORMAL LOW (ref 26.0–34.0)
MCHC: 30.9 g/dL (ref 30.0–36.0)
MCV: 83.5 fL (ref 80.0–100.0)
Monocytes Absolute: 1.2 10*3/uL — ABNORMAL HIGH (ref 0.1–1.0)
Monocytes Relative: 9 %
Neutro Abs: 10.1 10*3/uL — ABNORMAL HIGH (ref 1.7–7.7)
Neutrophils Relative %: 80 %
Platelets: 374 10*3/uL (ref 150–400)
RBC: 4.49 MIL/uL (ref 3.87–5.11)
RDW: 12.6 % (ref 11.5–15.5)
WBC: 12.8 10*3/uL — ABNORMAL HIGH (ref 4.0–10.5)
nRBC: 0 % (ref 0.0–0.2)

## 2019-04-20 LAB — PROTIME-INR
INR: 1.3 — ABNORMAL HIGH (ref 0.8–1.2)
Prothrombin Time: 15.6 seconds — ABNORMAL HIGH (ref 11.4–15.2)

## 2019-04-20 LAB — COMPREHENSIVE METABOLIC PANEL
ALT: 8 U/L (ref 0–44)
AST: 10 U/L — ABNORMAL LOW (ref 15–41)
Albumin: 3.1 g/dL — ABNORMAL LOW (ref 3.5–5.0)
Alkaline Phosphatase: 83 U/L (ref 38–126)
Anion gap: 8 (ref 5–15)
BUN: 6 mg/dL (ref 6–20)
CO2: 27 mmol/L (ref 22–32)
Calcium: 8.4 mg/dL — ABNORMAL LOW (ref 8.9–10.3)
Chloride: 101 mmol/L (ref 98–111)
Creatinine, Ser: 0.87 mg/dL (ref 0.44–1.00)
GFR calc Af Amer: >60 mL/min (ref >60)
GFR calc non Af Amer: >60 mL/min (ref >60)
Glucose, Bld: 119 mg/dL — ABNORMAL HIGH (ref 70–99)
Potassium: 3.9 mmol/L (ref 3.5–5.1)
Sodium: 136 mmol/L (ref 135–145)
Total Bilirubin: 0.2 mg/dL — ABNORMAL LOW (ref 0.3–1.2)
Total Protein: 8.2 g/dL — ABNORMAL HIGH (ref 6.5–8.1)

## 2019-04-20 LAB — APTT: aPTT: 38 seconds — ABNORMAL HIGH (ref 24–36)

## 2019-04-20 LAB — PREGNANCY, URINE: Preg Test, Ur: NEGATIVE

## 2019-04-20 LAB — SARS CORONAVIRUS 2 BY RT PCR (HOSPITAL ORDER, PERFORMED IN ~~LOC~~ HOSPITAL LAB): SARS Coronavirus 2: NEGATIVE

## 2019-04-20 MED ORDER — LIDOCAINE HCL 1 % IJ SOLN
INTRAMUSCULAR | Status: AC
  Filled 2019-04-20: qty 20

## 2019-04-20 MED ORDER — MIDAZOLAM HCL 2 MG/2ML IJ SOLN
INTRAMUSCULAR | Status: AC
  Filled 2019-04-20: qty 2

## 2019-04-20 MED ORDER — ONDANSETRON HCL 4 MG PO TABS: 4.0000 mg | ORAL_TABLET | Freq: Four times a day (QID) | ORAL | Status: DC | PRN

## 2019-04-20 MED ORDER — ONDANSETRON HCL 4 MG/2ML IJ SOLN: 4.0000 mg | Freq: Four times a day (QID) | INTRAMUSCULAR | Status: DC | PRN

## 2019-04-20 MED ORDER — SODIUM CHLORIDE 0.9 % IV SOLN
1.0000 g | INTRAVENOUS | Status: DC
  Administered 2019-04-20: 18:00:00 1 g via INTRAVENOUS
  Filled 2019-04-20 (×2): qty 10

## 2019-04-20 MED ORDER — KETOROLAC TROMETHAMINE 60 MG/2ML IM SOLN
60.0000 mg | Freq: Once | INTRAMUSCULAR | Status: AC
  Administered 2019-04-20: 16:00:00 60 mg via INTRAMUSCULAR
  Filled 2019-04-20: qty 2

## 2019-04-20 MED ORDER — KETOROLAC TROMETHAMINE 30 MG/ML IJ SOLN
30.0000 mg | Freq: Four times a day (QID) | INTRAMUSCULAR | Status: DC | PRN
  Administered 2019-04-20 – 2019-04-24 (×12): 30 mg via INTRAVENOUS
  Filled 2019-04-20 (×13): qty 1

## 2019-04-20 MED ORDER — FENTANYL CITRATE (PF) 100 MCG/2ML IJ SOLN
INTRAMUSCULAR | Status: AC
  Filled 2019-04-20: qty 2

## 2019-04-20 MED ORDER — BUPRENORPHINE HCL-NALOXONE HCL 2-0.5 MG SL SUBL
1.0000 | SUBLINGUAL_TABLET | Freq: Two times a day (BID) | SUBLINGUAL | Status: DC
  Administered 2019-04-21 – 2019-04-24 (×7): 1 via SUBLINGUAL
  Filled 2019-04-20 (×8): qty 1

## 2019-04-20 MED ORDER — SODIUM CHLORIDE 0.9 % IV SOLN
INTRAVENOUS | Status: DC
  Administered 2019-04-20 – 2019-04-21 (×2): via INTRAVENOUS

## 2019-04-20 MED ORDER — SODIUM CHLORIDE 0.9 % IV SOLN: 1.0000 g | Freq: Once | INTRAVENOUS | Status: DC

## 2019-04-20 MED ORDER — SODIUM CHLORIDE 0.9 % IV SOLN: 2.0000 g | INTRAVENOUS | Status: DC

## 2019-04-20 MED ORDER — FENTANYL CITRATE (PF) 100 MCG/2ML IJ SOLN
INTRAMUSCULAR | Status: AC | PRN
  Administered 2019-04-20 (×2): 50 ug via INTRAVENOUS

## 2019-04-20 MED ORDER — IOHEXOL 300 MG/ML  SOLN
50.0000 mL | Freq: Once | INTRAMUSCULAR | Status: AC | PRN
  Administered 2019-04-20: 5 mL

## 2019-04-20 MED ORDER — MIDAZOLAM HCL 2 MG/2ML IJ SOLN
INTRAMUSCULAR | Status: AC | PRN
  Administered 2019-04-20 (×2): 1 mg via INTRAVENOUS

## 2019-04-20 MED ORDER — POLYETHYLENE GLYCOL 3350 17 G PO PACK
17.0000 g | PACK | Freq: Every day | ORAL | Status: DC | PRN
  Administered 2019-04-23: 17 g via ORAL
  Filled 2019-04-20: qty 1

## 2019-04-20 MED ORDER — NICOTINE 14 MG/24HR TD PT24
14.0000 mg | MEDICATED_PATCH | Freq: Every day | TRANSDERMAL | Status: DC
  Administered 2019-04-20 – 2019-04-21 (×3): 14 mg via TRANSDERMAL
  Filled 2019-04-20 (×4): qty 1

## 2019-04-20 NOTE — H&P (View-Only) (Signed)
History and Physical  CARRI SPILLERS XNT:[id number] DOB: April 17, 1981 DOA: 04/20/2019  Referring physician: Trisha Mangle, PA-C, ED provider PCP: Gareth Morgan, MD  Outpatient Specialists:   Patient Coming From: home  Chief Complaint: Abdominal, left side pain  HPI: Pamela Horn is a 38 y.o. 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  Presents with 24 hours of worsening left-sided abdominal pain started after dinner yesterday and progressed today.  Movement increases pain.  Patient had Toradol which improved her pain.  No other palliating or provoking factors.  She does feel quite nauseated and has not eaten.  Emergency Department Course: CT of the abdomen shows right hydronephrosis with infection/inflammatory changes and foci of gas within renal parenchyma.  She does have not malarious large right renal calculi and a obstructive calculus in the distal right ureter at the vesicoureteral junction.  White count 12.  Creatinine normal.  Review of Systems:   Pt denies any fevers, chills, nausea, vomiting, diarrhea, constipation, shortness of breath, dyspnea on exertion, orthopnea, cough, wheezing, palpitations, headache, vision changes, lightheadedness, dizziness, melena, rectal bleeding.  Review of systems are otherwise negative  Past Medical History:  Diagnosis Date  . Pre-eclampsia    1st pregnancy  . Pregnancy induced hypertension    Past Surgical History:  Procedure Laterality Date  . CESAREAN SECTION  10/18/1999  . CESAREAN SECTION N/A 12/02/2014   Procedure: CESAREAN SECTION (MULTIPLE) REPEAT;  Surgeon: Lazaro Arms, MD;  Location: [facility];  Service: Obstetrics;  Laterality: N/A;  . COLPOSCOPY  04/04/2000 & 10/03/2000  . GYNECOLOGIC CRYOSURGERY  11/07/2000  . LAPAROSCOPIC BILATERAL SALPINGECTOMY Bilateral 02/25/2015   Procedure: LAPAROSCOPIC BILATERAL SALPINGECTOMY;  Surgeon: Lazaro Arms, MD;  Location: [facility];  Service: Gynecology;   Laterality: Bilateral;   Social History:  reports that she has been smoking cigarettes. She has a 24.00 pack-year smoking history. She has never used smokeless tobacco. She reports that she does not drink alcohol or use drugs. Patient lives at home  No Known Allergies  Family History  Problem Relation Age of Onset  . Hypertension Mother   . Cancer Father        melanoma  . Cancer Sister   . Diabetes Paternal Grandfather       Prior to Admission medications   Medication Sig Start Date End Date Taking? Authorizing Provider  Buprenorphine HCl-Naloxone HCl 8-2 MG FILM Place 0.25 Film under the tongue 2 (two) times daily. 1/4 FILM under the tongue twice daily 03/27/18   [provider]    Physical Exam: BP 109/73 (BP Location: Right Arm)   Pulse (!) 53   Temp 97.9 F (36.6 C) (Oral)   Resp 16   Ht 5\' 3"  (1.6 m)   Wt 78 kg   LMP 04/03/2019 Comment: neg preg  SpO2 98%   BMI 30.47 kg/m   . General: Middle-aged female. Awake and alert and oriented x3. No acute cardiopulmonary distress.  04/05/2019 HEENT: Normocephalic atraumatic.  Right and left ears normal in appearance.  Pupils equal, round, reactive to light. Extraocular muscles are intact. Sclerae anicteric and noninjected.  Moist mucosal membranes. No mucosal lesions.  . Neck: Neck supple without lymphadenopathy. No carotid bruits. No masses palpated.  . Cardiovascular: Regular rate with normal S1-S2 sounds. No murmurs, rubs, gallops auscultated. No JVD.  Marland Kitchen Respiratory: Good respiratory effort with no wheezes, rales, rhonchi. Lungs clear to auscultation bilaterally.  No accessory muscle use. . Abdomen: Soft, tender  in the right abdomen.  No rebound or guarding. nondistended. Active bowel sounds. No masses or hepatosplenomegaly  . Skin: No rashes, lesions, or ulcerations.  Dry, warm to touch. 2+ dorsalis pedis and radial pulses. . Musculoskeletal: No calf or leg pain. All major joints not erythematous nontender.  No upper or lower  joint deformation.  Good ROM.  No contractures  . Psychiatric: Intact judgment and insight. Pleasant and cooperative. . Neurologic: No focal neurological deficits. Strength is 5/5 and symmetric in upper and lower extremities.  Cranial nerves II through XII are grossly intact.           Labs on Admission: I have personally reviewed following labs and imaging studies  CBC: Recent Labs  Lab 04/20/19 1351  WBC 12.8*  NEUTROABS 10.1*  HGB 11.6*  HCT 37.5  MCV 83.5  PLT 790   Basic Metabolic Panel: Recent Labs  Lab 04/20/19 1351  NA 136  K 3.9  CL 101  CO2 27  GLUCOSE 119*  BUN 6  CREATININE 0.87  CALCIUM 8.4*   GFR: Estimated Creatinine Clearance: 84.9 mL/min (by C-G formula based on SCr of 0.87 mg/dL). Liver Function Tests: Recent Labs  Lab 04/20/19 1351  AST 10*  ALT 8  ALKPHOS 83  BILITOT 0.2*  PROT 8.2*  ALBUMIN 3.1*   No results for input(s): LIPASE, AMYLASE in the last 168 hours. No results for input(s): AMMONIA in the last 168 hours. Coagulation Profile: No results for input(s): INR, PROTIME in the last 168 hours. Cardiac Enzymes: No results for input(s): CKTOTAL, CKMB, CKMBINDEX, TROPONINI in the last 168 hours. BNP (last 3 results) No results for input(s): PROBNP in the last 8760 hours. HbA1C: No results for input(s): HGBA1C in the last 72 hours. CBG: No results for input(s): GLUCAP in the last 168 hours. Lipid Profile: No results for input(s): CHOL, HDL, LDLCALC, TRIG, CHOLHDL, LDLDIRECT in the last 72 hours. Thyroid Function Tests: No results for input(s): TSH, T4TOTAL, FREET4, T3FREE, THYROIDAB in the last 72 hours. Anemia Panel: No results for input(s): VITAMINB12, FOLATE, FERRITIN, TIBC, IRON, RETICCTPCT in the last 72 hours. Urine analysis:    Component Value Date/Time   COLORURINE YELLOW 04/20/2019 1318   APPEARANCEUR HAZY (A) 04/20/2019 1318   LABSPEC 1.018 04/20/2019 1318   PHURINE 5.0 04/20/2019 1318   GLUCOSEU NEGATIVE 04/20/2019  1318   HGBUR SMALL (A) 04/20/2019 1318   BILIRUBINUR NEGATIVE 04/20/2019 1318   KETONESUR NEGATIVE 04/20/2019 1318   PROTEINUR 30 (A) 04/20/2019 1318   UROBILINOGEN 0.2 02/19/2015 1500   NITRITE NEGATIVE 04/20/2019 1318   LEUKOCYTESUR MODERATE (A) 04/20/2019 1318   Sepsis Labs: @LABRCNTIP (procalcitonin:4,lacticidven:4) ) Recent Results (from the past 240 hour(s))  SARS Coronavirus 2 by RT PCR (hospital order, performed in [place] hospital lab) Nasopharyngeal Nasopharyngeal Swab     Status: None   Collection Time: 04/20/19  4:38 PM   Specimen: Nasopharyngeal Swab  Result Value Ref Range Status   SARS Coronavirus 2 NEGATIVE NEGATIVE Final    Comment: (NOTE) If result is NEGATIVE SARS-CoV-2 target nucleic acids are NOT DETECTED. The SARS-CoV-2 RNA is generally detectable in upper and lower  respiratory specimens during the acute phase of infection. The lowest  concentration of SARS-CoV-2 viral copies this assay can detect is 250  copies / mL. A negative result does not preclude SARS-CoV-2 infection  and should not be used as the sole basis for treatment or other  patient management decisions.  A negative result may occur with  improper  specimen collection / handling, submission of specimen other  than nasopharyngeal swab, presence of viral mutation(s) within the  areas targeted by this assay, and inadequate number of viral copies  (<250 copies / mL). A negative result must be combined with clinical  observations, patient history, and epidemiological information. If result is POSITIVE SARS-CoV-2 target nucleic acids are DETECTED. The SARS-CoV-2 RNA is generally detectable in upper and lower  respiratory specimens dur ing the acute phase of infection.  Positive  results are indicative of active infection with SARS-CoV-2.  Clinical  correlation with patient history and other diagnostic information is  necessary to determine patient infection status.  Positive results do  not  rule out bacterial infection or co-infection with other viruses. If result is PRESUMPTIVE POSTIVE SARS-CoV-2 nucleic acids MAY BE PRESENT.   A presumptive positive result was obtained on the submitted specimen  and confirmed on repeat testing.  While 2019 novel coronavirus  (SARS-CoV-2) nucleic acids may be present in the submitted sample  additional confirmatory testing may be necessary for epidemiological  and / or clinical management purposes  to differentiate between  SARS-CoV-2 and other Sarbecovirus currently known to infect humans.  If clinically indicated additional testing with an alternate test  methodology ([id number]) is advised. The SARS-CoV-2 RNA is generally  detectable in upper and lower respiratory sp ecimens during the acute  phase of infection. The expected result is Negative. Fact Sheet for Patients:  https://www.fda.gov/media/136312/download Fact Sheet for Healthcare Providers: https://www.fda.gov/media/136313/download This test is not yet approved or cleared by the [place] FDA and has been authorized for detection and/or diagnosis of SARS-CoV-2 by FDA under an Emergency Use Authorization (EUA).  This EUA will remain in effect (meaning this test can be used) for the duration of the COVID-19 declaration under Section 564(b)(1) of the Act, 21 U.S.C. section 360bbb-3(b)(1), unless the authorization is terminated or revoked sooner. Performed at [facility], [address] [place], [place] [address]      Radiological Exams on Admission: Ct Renal Stone Study  Result Date: 04/20/2019 CLINICAL DATA:  Right-sided flank pain for 1 day. EXAM: CT ABDOMEN AND PELVIS WITHOUT CONTRAST TECHNIQUE: Multidetector CT imaging of the abdomen and pelvis was performed following the standard protocol without IV contrast. COMPARISON:  September 06, 2008 FINDINGS: Lower chest: No acute abnormality. Hepatobiliary: No focal liver abnormality is seen. No gallstones, gallbladder wall  thickening, or biliary dilatation. Pancreas: Unremarkable. No pancreatic ductal dilatation or surrounding inflammatory changes. Spleen: Normal in size without focal abnormality. Adrenals/Urinary Tract: Normal adrenal glands. Normal appearance of the left kidney, left ureter and urinary bladder. Markedly abnormal appearance of the right kidney. Numerous large right renal calculi, including a 12 mm calculus within the right renal pelvis at the UPJ. Abnormal hypoattenuated appearance of the right kidney likely represents severe hydronephrosis with thinning of the right renal cortex. Scattered foci of gas within the right renal parenchyma/collecting system are noted. Marked perinephric inflammatory changes. The right ureter is dilated with the proximal ureter measuring 18 mm. There is an obstructive 5.7 mm calculus in the distal right ureter, just shy of the vesicoureteral junction. Periureteral inflammatory changes also present. Stomach/Bowel: Stomach is within normal limits. No evidence of appendicitis. No evidence of bowel wall thickening, distention, or inflammatory changes. Vascular/Lymphatic: Abnormal enlarged and rounded right retroperitoneal lymph nodes measure up to 19 mm in short axis. Reproductive: Uterus and bilateral adnexa are unremarkable. Other: No abdominal wall hernia or abnormality. No abdominopelvic ascites. Musculoskeletal: No acute osseous findings. Bilateral sacroiliitis.   IMPRESSION: 1. 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 2. Numerous large right renal calculi, including a 12 mm probably obstructive calculus within the right renal pelvis at the UPJ. 5.7 mm obstructive calculus in the distal right ureter, just shy of the vesicoureteral junction. 3. Abnormal enlarged and rounded  right retroperitoneal lymph nodes measure up to 19 mm in short axis, presumably reactive. Follow-up may be considered after resolution of patient's acute symptomatology to assure resolution. These results were called by telephone at the time of interpretation on 04/20/2019 at 4:13 pm to provider Trisha MangleKaren Sophia, who verbally acknowledged these results. Electronically Signed   By: Ted Mcalpineobrinka  Dimitrova M.D.   On: 04/20/2019 16:23    Assessment/Plan: Principal Problem:   Pyelonephritis Active Problems:   Smoker   Hydronephrosis with renal and ureteral calculus obstruction   Opioid use disorder [facility](HCC)    This patient was discussed with the ED physician, including pertinent vitals, physical exam findings, labs, and imaging.  We also discussed care given by the ED provider.  1. Pyelonephritis a. Admit to was a long hospital b. Rocephin and gentamicin per urology c. Repeat CBC in the morning d. IV fluids e. Diet n.p.o. 2. Hydronephrosis with renal and ureteral calculus obstruction a. N.p.o. as patient may require procedure due to hydronephrosis b. Urology consulted 3. Tobacco abuse a. NicoDerm patch 4. Opioid use disorder a. Toradol for pain b. Continue Suboxone c. May require opioids for pain control  DVT prophylaxis: SCDs Consultants: Urology Code Status: Full code Family Communication: Husband in the room Disposition Plan: Patient to return home following resolution of hydronephrosis and stones.   Levie HeritageStinson, Quintus Premo J, DO

## 2019-04-20 NOTE — ED Notes (Signed)
To CT

## 2019-04-20 NOTE — ED Notes (Signed)
Report to [place], [facility] 

## 2019-04-20 NOTE — ED Provider Notes (Signed)
[facility]ENCY [facility]PARTMENT Provider Note   CSN: [id number] Arrival date & time: 04/20/19  1233     History   Chief Complaint Chief Complaint  Patient presents with  . Flank Pain    right    HPI Pamela Horn is a 38 y.o. female.     The history is provided by the patient. No language interpreter was used.  Flank Pain This is a new problem. The current episode started yesterday. The problem occurs constantly. The problem has been gradually worsening. Associated symptoms include abdominal pain. Nothing aggravates the symptoms. Nothing relieves the symptoms. She has tried nothing for the symptoms. The treatment provided no relief.  Pt complains of pain in right back that radiates around to right side.  Pt reports she vomited once.  Pt reports normal Bowel movement yesterday  No constipation   Past Medical History:  Diagnosis Date  . Pre-eclampsia    1st pregnancy  . Pregnancy induced hypertension     Patient Active Problem List   Diagnosis Date Noted  . Encounter for gynecological examination with Papanicolaou smear of cervix 04/24/2018  . Family planning 04/24/2018  . Screening examination for STD (sexually transmitted disease) 04/24/2018  . Decreased libido 04/24/2018  . Previous cesarean section 12/02/2014  . Asymptomatic bacteriuria during pregnancy in first trimester 05/28/2014  . Supervision of other high-risk pregnancy 05/26/2014  . Dichorionic diamniotic twin gestation 05/26/2014  . History of cesarean delivery 05/26/2014  . Hx of preeclampsia, prior pregnancy, currently pregnant 05/26/2014  . AMA (advanced maternal age) multigravida 35+ 05/26/2014  . Smoker 05/26/2014    Past Surgical History:  Procedure Laterality Date  . CESAREAN SECTION  10/18/1999  . CESAREAN SECTION N/A 12/02/2014   Procedure: CESAREAN SECTION (MULTIPLE) REPEAT;  Surgeon: Lazaro ArmsLuther H Eure, MD;  Location: [facility];  Service: Obstetrics;  Laterality: N/A;  . COLPOSCOPY  04/04/2000 &  10/03/2000  . GYNECOLOGIC CRYOSURGERY  11/07/2000  . LAPAROSCOPIC BILATERAL SALPINGECTOMY Bilateral 02/25/2015   Procedure: LAPAROSCOPIC BILATERAL SALPINGECTOMY;  Surgeon: Lazaro ArmsLuther H Eure, MD;  Location: [facility];  Service: Gynecology;  Laterality: Bilateral;     OB History    Gravida  2   Para  2   Term  1   Preterm  1   AB      Living  3     SAB      TAB      Ectopic      Multiple  1   Live Births  1            Home Medications    Prior to Admission medications   Medication Sig Start Date End Date Taking? Authorizing Provider  Buprenorphine HCl-Naloxone HCl 8-2 MG FILM Place 0.25 Film under the tongue 2 (two) times daily. 1/4 FILM under the tongue twice daily 03/27/18   [provider]    Family History Family History  Problem Relation Age of Onset  . Hypertension Mother   . Cancer Father        melanoma  . Cancer Sister   . Diabetes Paternal Grandfather     Social History Social History   Tobacco Use  . Smoking status: Current Every Day Smoker    Packs/day: 1.00    Years: 24.00    Pack years: 24.00    Types: Cigarettes  . Smokeless tobacco: Never Used  Substance Use Topics  . Alcohol use: No  . Drug use: No     Allergies   Patient  has no known allergies.   Review of Systems Review of Systems  Gastrointestinal: Positive for abdominal pain.  Genitourinary: Positive for flank pain.  All other systems reviewed and are negative.    Physical Exam Updated Vital Signs BP 125/80 (BP Location: Right Arm)   Pulse 96   Temp (!) 100.8 F (38.2 C) (Oral)   Resp 18   Ht 5\' 3"  (1.6 m)   Wt 78 kg   LMP 04/03/2019 Comment: neg preg  SpO2 98%   BMI 30.47 kg/m   Physical Exam Vitals signs and nursing note reviewed.  Constitutional:      Appearance: She is well-developed.  HENT:     Head: Normocephalic.  Eyes:     Pupils: Pupils are equal, round, and reactive to light.  Neck:     Musculoskeletal: Normal range of motion.   Cardiovascular:     Rate and Rhythm: Normal rate and regular rhythm.  Pulmonary:     Effort: Pulmonary effort is normal.  Abdominal:     General: There is no distension.  Musculoskeletal: Normal range of motion.  Skin:    General: Skin is warm.  Neurological:     General: No focal deficit present.     Mental Status: She is alert and oriented to person, place, and time.  Psychiatric:        Mood and Affect: Mood normal.      ED Treatments / Results  Labs (all labs ordered are listed, but only abnormal results are displayed) Labs Reviewed  URINALYSIS, ROUTINE W REFLEX MICROSCOPIC - Abnormal; Notable for the following components:      Result Value   APPearance HAZY (*)    Hgb urine dipstick SMALL (*)    Protein, ur 30 (*)    Leukocytes,Ua MODERATE (*)    WBC, UA >50 (*)    Bacteria, UA RARE (*)    All other components within normal limits  CBC WITH DIFFERENTIAL/PLATELET - Abnormal; Notable for the following components:   WBC 12.8 (*)    Hemoglobin 11.6 (*)    MCH 25.8 (*)    Neutro Abs 10.1 (*)    Monocytes Absolute 1.2 (*)    All other components within normal limits  COMPREHENSIVE METABOLIC PANEL - Abnormal; Notable for the following components:   Glucose, Bld 119 (*)    Calcium 8.4 (*)    Total Protein 8.2 (*)    Albumin 3.1 (*)    AST 10 (*)    Total Bilirubin 0.2 (*)    All other components within normal limits  URINE CULTURE  PREGNANCY, URINE    EKG None  Radiology No results found.  Procedures Procedures (including critical care time)  Medications Ordered in ED Medications  ketorolac (TORADOL) injection 60 mg (60 mg Intramuscular Given 04/20/19 1540)     Initial Impression / Assessment and Plan / ED Course  I have reviewed the triage vital signs and the nursing notes.  Pertinent labs & imaging results that were available during my care of the patient were reviewed by me and considered in my medical decision making (see chart for details).         MDM  Ua shows greater 50 wbc's.   Pt has elevated wbc count.  I suspect pyelonephritis.  Pt given torodol with improvement of pain.  Ct scan obtained and shows.  I spoke to Dr. Lovena Neighbours on call for Urology.  He advised to have hospitalist admit and transfer to [facility].  Pt  may need drain placement.  He advised Rocephin and gentomycin  Hospitalist consulted  Final Clinical Impressions(s) / ED Diagnoses   Final diagnoses:  Nephrolithiasis  Hydronephrosis with ureteropelvic junction (UPJ) obstruction  Pyelonephritis    ED Discharge Orders    None    An After Visit Summary was printed and given to the patient.    Elson Areas, [place] 04/20/19 1914    Vanetta Mulders, MD 04/22/19 1810

## 2019-04-20 NOTE — H&P (Signed)
History and Physical  Pamela Horn XNT:[id number] DOB: April 17, 1981 DOA: 04/20/2019  Referring physician: Trisha Mangle, PA-C, ED provider PCP: Gareth Morgan, MD  Outpatient Specialists:   Patient Coming From: home  Chief Complaint: Abdominal, left side pain  HPI: Pamela Horn is a 38 y.o. 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  Presents with 24 hours of worsening left-sided abdominal pain started after dinner yesterday and progressed today.  Movement increases pain.  Patient had Toradol which improved her pain.  No other palliating or provoking factors.  She does feel quite nauseated and has not eaten.  Emergency Department Course: CT of the abdomen shows right hydronephrosis with infection/inflammatory changes and foci of gas within renal parenchyma.  She does have not malarious large right renal calculi and a obstructive calculus in the distal right ureter at the vesicoureteral junction.  White count 12.  Creatinine normal.  Review of Systems:   Pt denies any fevers, chills, nausea, vomiting, diarrhea, constipation, shortness of breath, dyspnea on exertion, orthopnea, cough, wheezing, palpitations, headache, vision changes, lightheadedness, dizziness, melena, rectal bleeding.  Review of systems are otherwise negative  Past Medical History:  Diagnosis Date  . Pre-eclampsia    1st pregnancy  . Pregnancy induced hypertension    Past Surgical History:  Procedure Laterality Date  . CESAREAN SECTION  10/18/1999  . CESAREAN SECTION N/A 12/02/2014   Procedure: CESAREAN SECTION (MULTIPLE) REPEAT;  Surgeon: Lazaro Arms, MD;  Location: [facility];  Service: Obstetrics;  Laterality: N/A;  . COLPOSCOPY  04/04/2000 & 10/03/2000  . GYNECOLOGIC CRYOSURGERY  11/07/2000  . LAPAROSCOPIC BILATERAL SALPINGECTOMY Bilateral 02/25/2015   Procedure: LAPAROSCOPIC BILATERAL SALPINGECTOMY;  Surgeon: Lazaro Arms, MD;  Location: [facility];  Service: Gynecology;   Laterality: Bilateral;   Social History:  reports that she has been smoking cigarettes. She has a 24.00 pack-year smoking history. She has never used smokeless tobacco. She reports that she does not drink alcohol or use drugs. Patient lives at home  No Known Allergies  Family History  Problem Relation Age of Onset  . Hypertension Mother   . Cancer Father        melanoma  . Cancer Sister   . Diabetes Paternal Grandfather       Prior to Admission medications   Medication Sig Start Date End Date Taking? Authorizing Provider  Buprenorphine HCl-Naloxone HCl 8-2 MG FILM Place 0.25 Film under the tongue 2 (two) times daily. 1/4 FILM under the tongue twice daily 03/27/18   [provider]    Physical Exam: BP 109/73 (BP Location: Right Arm)   Pulse (!) 53   Temp 97.9 F (36.6 C) (Oral)   Resp 16   Ht 5\' 3"  (1.6 m)   Wt 78 kg   LMP 04/03/2019 Comment: neg preg  SpO2 98%   BMI 30.47 kg/m   . General: Middle-aged female. Awake and alert and oriented x3. No acute cardiopulmonary distress.  04/05/2019 HEENT: Normocephalic atraumatic.  Right and left ears normal in appearance.  Pupils equal, round, reactive to light. Extraocular muscles are intact. Sclerae anicteric and noninjected.  Moist mucosal membranes. No mucosal lesions.  . Neck: Neck supple without lymphadenopathy. No carotid bruits. No masses palpated.  . Cardiovascular: Regular rate with normal S1-S2 sounds. No murmurs, rubs, gallops auscultated. No JVD.  Marland Kitchen Respiratory: Good respiratory effort with no wheezes, rales, rhonchi. Lungs clear to auscultation bilaterally.  No accessory muscle use. . Abdomen: Soft, tender  in the right abdomen.  No rebound or guarding. nondistended. Active bowel sounds. No masses or hepatosplenomegaly  . Skin: No rashes, lesions, or ulcerations.  Dry, warm to touch. 2+ dorsalis pedis and radial pulses. . Musculoskeletal: No calf or leg pain. All major joints not erythematous nontender.  No upper or lower  joint deformation.  Good ROM.  No contractures  . Psychiatric: Intact judgment and insight. Pleasant and cooperative. . Neurologic: No focal neurological deficits. Strength is 5/5 and symmetric in upper and lower extremities.  Cranial nerves II through XII are grossly intact.           Labs on Admission: I have personally reviewed following labs and imaging studies  CBC: Recent Labs  Lab 04/20/19 1351  WBC 12.8*  NEUTROABS 10.1*  HGB 11.6*  HCT 37.5  MCV 83.5  PLT 790   Basic Metabolic Panel: Recent Labs  Lab 04/20/19 1351  NA 136  K 3.9  CL 101  CO2 27  GLUCOSE 119*  BUN 6  CREATININE 0.87  CALCIUM 8.4*   GFR: Estimated Creatinine Clearance: 84.9 mL/min (by C-G formula based on SCr of 0.87 mg/dL). Liver Function Tests: Recent Labs  Lab 04/20/19 1351  AST 10*  ALT 8  ALKPHOS 83  BILITOT 0.2*  PROT 8.2*  ALBUMIN 3.1*   No results for input(s): LIPASE, AMYLASE in the last 168 hours. No results for input(s): AMMONIA in the last 168 hours. Coagulation Profile: No results for input(s): INR, PROTIME in the last 168 hours. Cardiac Enzymes: No results for input(s): CKTOTAL, CKMB, CKMBINDEX, TROPONINI in the last 168 hours. BNP (last 3 results) No results for input(s): PROBNP in the last 8760 hours. HbA1C: No results for input(s): HGBA1C in the last 72 hours. CBG: No results for input(s): GLUCAP in the last 168 hours. Lipid Profile: No results for input(s): CHOL, HDL, LDLCALC, TRIG, CHOLHDL, LDLDIRECT in the last 72 hours. Thyroid Function Tests: No results for input(s): TSH, T4TOTAL, FREET4, T3FREE, THYROIDAB in the last 72 hours. Anemia Panel: No results for input(s): VITAMINB12, FOLATE, FERRITIN, TIBC, IRON, RETICCTPCT in the last 72 hours. Urine analysis:    Component Value Date/Time   COLORURINE YELLOW 04/20/2019 1318   APPEARANCEUR HAZY (A) 04/20/2019 1318   LABSPEC 1.018 04/20/2019 1318   PHURINE 5.0 04/20/2019 1318   GLUCOSEU NEGATIVE 04/20/2019  1318   HGBUR SMALL (A) 04/20/2019 1318   BILIRUBINUR NEGATIVE 04/20/2019 1318   KETONESUR NEGATIVE 04/20/2019 1318   PROTEINUR 30 (A) 04/20/2019 1318   UROBILINOGEN 0.2 02/19/2015 1500   NITRITE NEGATIVE 04/20/2019 1318   LEUKOCYTESUR MODERATE (A) 04/20/2019 1318   Sepsis Labs: @LABRCNTIP (procalcitonin:4,lacticidven:4) ) Recent Results (from the past 240 hour(s))  SARS Coronavirus 2 by RT PCR (hospital order, performed in [place] hospital lab) Nasopharyngeal Nasopharyngeal Swab     Status: None   Collection Time: 04/20/19  4:38 PM   Specimen: Nasopharyngeal Swab  Result Value Ref Range Status   SARS Coronavirus 2 NEGATIVE NEGATIVE Final    Comment: (NOTE) If result is NEGATIVE SARS-CoV-2 target nucleic acids are NOT DETECTED. The SARS-CoV-2 RNA is generally detectable in upper and lower  respiratory specimens during the acute phase of infection. The lowest  concentration of SARS-CoV-2 viral copies this assay can detect is 250  copies / mL. A negative result does not preclude SARS-CoV-2 infection  and should not be used as the sole basis for treatment or other  patient management decisions.  A negative result may occur with  improper  specimen collection / handling, submission of specimen other  than nasopharyngeal swab, presence of viral mutation(s) within the  areas targeted by this assay, and inadequate number of viral copies  (<250 copies / mL). A negative result must be combined with clinical  observations, patient history, and epidemiological information. If result is POSITIVE SARS-CoV-2 target nucleic acids are DETECTED. The SARS-CoV-2 RNA is generally detectable in upper and lower  respiratory specimens dur ing the acute phase of infection.  Positive  results are indicative of active infection with SARS-CoV-2.  Clinical  correlation with patient history and other diagnostic information is  necessary to determine patient infection status.  Positive results do  not  rule out bacterial infection or co-infection with other viruses. If result is PRESUMPTIVE POSTIVE SARS-CoV-2 nucleic acids MAY BE PRESENT.   A presumptive positive result was obtained on the submitted specimen  and confirmed on repeat testing.  While 2019 novel coronavirus  (SARS-CoV-2) nucleic acids may be present in the submitted sample  additional confirmatory testing may be necessary for epidemiological  and / or clinical management purposes  to differentiate between  SARS-CoV-2 and other Sarbecovirus currently known to infect humans.  If clinically indicated additional testing with an alternate test  methodology [phone]) is advised. The SARS-CoV-2 RNA is generally  detectable in upper and lower respiratory sp ecimens during the acute  phase of infection. The expected result is Negative. Fact Sheet for Patients:  BoilerBrush.com.cy Fact Sheet for Healthcare Providers: https://pope.com/ This test is not yet approved or cleared by the [place] FDA and has been authorized for detection and/or diagnosis of SARS-CoV-2 by FDA under an Emergency Use Authorization (EUA).  This EUA will remain in effect (meaning this test can be used) for the duration of the COVID-19 declaration under Section 564(b)(1) of the Act, 21 U.S.C. section 360bbb-3(b)(1), unless the authorization is terminated or revoked sooner. Performed at [facility], [address], [place], [place] [address]      Radiological Exams on Admission: Ct Renal Stone Study  Result Date: 04/20/2019 CLINICAL DATA:  Right-sided flank pain for 1 day. EXAM: CT ABDOMEN AND PELVIS WITHOUT CONTRAST TECHNIQUE: Multidetector CT imaging of the abdomen and pelvis was performed following the standard protocol without IV contrast. COMPARISON:  September 06, 2008 FINDINGS: Lower chest: No acute abnormality. Hepatobiliary: No focal liver abnormality is seen. No gallstones, gallbladder wall  thickening, or biliary dilatation. Pancreas: Unremarkable. No pancreatic ductal dilatation or surrounding inflammatory changes. Spleen: Normal in size without focal abnormality. Adrenals/Urinary Tract: Normal adrenal glands. Normal appearance of the left kidney, left ureter and urinary bladder. Markedly abnormal appearance of the right kidney. Numerous large right renal calculi, including a 12 mm calculus within the right renal pelvis at the UPJ. Abnormal hypoattenuated appearance of the right kidney likely represents severe hydronephrosis with thinning of the right renal cortex. Scattered foci of gas within the right renal parenchyma/collecting system are noted. Marked perinephric inflammatory changes. The right ureter is dilated with the proximal ureter measuring 18 mm. There is an obstructive 5.7 mm calculus in the distal right ureter, just shy of the vesicoureteral junction. Periureteral inflammatory changes also present. Stomach/Bowel: Stomach is within normal limits. No evidence of appendicitis. No evidence of bowel wall thickening, distention, or inflammatory changes. Vascular/Lymphatic: Abnormal enlarged and rounded right retroperitoneal lymph nodes measure up to 19 mm in short axis. Reproductive: Uterus and bilateral adnexa are unremarkable. Other: No abdominal wall hernia or abnormality. No abdominopelvic ascites. Musculoskeletal: No acute osseous findings. Bilateral sacroiliitis.  IMPRESSION: 1. 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 2. Numerous large right renal calculi, including a 12 mm probably obstructive calculus within the right renal pelvis at the UPJ. 5.7 mm obstructive calculus in the distal right ureter, just shy of the vesicoureteral junction. 3. Abnormal enlarged and rounded  right retroperitoneal lymph nodes measure up to 19 mm in short axis, presumably reactive. Follow-up may be considered after resolution of patient's acute symptomatology to assure resolution. These results were called by telephone at the time of interpretation on 04/20/2019 at 4:13 pm to provider Trisha MangleKaren Sophia, who verbally acknowledged these results. Electronically Signed   By: Ted Mcalpineobrinka  Dimitrova M.D.   On: 04/20/2019 16:23    Assessment/Plan: Principal Problem:   Pyelonephritis Active Problems:   Smoker   Hydronephrosis with renal and ureteral calculus obstruction   Opioid use disorder [facility](HCC)    This patient was discussed with the ED physician, including pertinent vitals, physical exam findings, labs, and imaging.  We also discussed care given by the ED provider.  1. Pyelonephritis a. Admit to was a long hospital b. Rocephin and gentamicin per urology c. Repeat CBC in the morning d. IV fluids e. Diet n.p.o. 2. Hydronephrosis with renal and ureteral calculus obstruction a. N.p.o. as patient may require procedure due to hydronephrosis b. Urology consulted 3. Tobacco abuse a. NicoDerm patch 4. Opioid use disorder a. Toradol for pain b. Continue Suboxone c. May require opioids for pain control  DVT prophylaxis: SCDs Consultants: Urology Code Status: Full code Family Communication: Husband in the room Disposition Plan: Patient to return home following resolution of hydronephrosis and stones.   Pamela Horn, Pamela Afzal J, DO

## 2019-04-20 NOTE — ED Notes (Signed)
Lab in room.

## 2019-04-20 NOTE — Treatment Plan (Signed)
Plan for nephrostomy tube placement on arrival to [facility]. Please send stat coags (ordered) now prior to transfer so these will result sooner. Please page Dr Vernard Gambles, IR on call, at [phone] when patient arrives at [facility] per his request.  Discussed with Dr Lovena Neighbours, urology attending, and Dr Vernard Gambles, IR.  [facility]

## 2019-04-20 NOTE — ED Notes (Signed)
Pt to be transferred to [facility]

## 2019-04-20 NOTE — ED Notes (Signed)
Noticed dark urine for the last couple of days   Last night had R back pain into her abd - felt like a BM would help- had BM and it did not   Here today for eval   She reports no hx of kidney stones

## 2019-04-20 NOTE — ED Notes (Signed)
From CT 

## 2019-04-20 NOTE — ED Notes (Signed)
Pt to be transferred to [facility], Order for med unable to pull or override   Call to Octavia Bruckner [facility] for assist   He will call [facility] for assist that this RN might pull from [facility]

## 2019-04-20 NOTE — Consult Note (Signed)
Urology Consult   Physician requesting consult: Candelaria Celeste MD  Reason for consult: Emphysematous pyelonephritis, obstructing ureteral stones  History of Present Illness: Pamela Horn is a 38 y.o. 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  She had one episode of nausea and emesis prior to coming to hospital.  She denies any hematuria, dysuria, UTIs.  She denies prior history or knowledge of having any kidney stones.  Upon presentation to ED at [facility], she was found to have low-grade fevers and mild tachycardia.  WBC 12.8.  No AKI.  UA nitrite neg, LE positive, pyuria, rare bacteria.  She reports that she last ate over 1 day ago.  She denies any use of blood thinner medication.  Past Medical History:  Diagnosis Date  . Pre-eclampsia    1st pregnancy  . Pregnancy induced hypertension     Past Surgical History:  Procedure Laterality Date  . CESAREAN SECTION  10/18/1999  . CESAREAN SECTION N/A 12/02/2014   Procedure: CESAREAN SECTION (MULTIPLE) REPEAT;  Surgeon: Lazaro Arms, MD;  Location: [facility];  Service: Obstetrics;  Laterality: N/A;  . COLPOSCOPY  04/04/2000 & 10/03/2000  . GYNECOLOGIC CRYOSURGERY  11/07/2000  . LAPAROSCOPIC BILATERAL SALPINGECTOMY Bilateral 02/25/2015   Procedure: LAPAROSCOPIC BILATERAL SALPINGECTOMY;  Surgeon: Lazaro Arms, MD;  Location: [facility];  Service: Gynecology;  Laterality: Bilateral;     Current Hospital Medications:  Home meds:  No current facility-administered medications on file prior to encounter.    Current Outpatient Medications on File Prior to Encounter  Medication Sig Dispense Refill  . Buprenorphine HCl-Naloxone HCl 8-2 MG FILM Place 0.25 Film under the  tongue 2 (two) times daily. 1/4 FILM under the tongue twice daily  0  . phenazopyridine (AZO-STANDARD) 95 MG tablet Take 95-190 mg by mouth daily as needed for pain.       Scheduled Meds: . buprenorphine-naloxone  1 tablet Sublingual BID  . nicotine  14 mg Transdermal Daily   Continuous Infusions: . sodium chloride 100 mL/hr at 04/20/19 2053  . cefTRIAXone (ROCEPHIN)  IV Stopped (04/20/19 1906)   PRN Meds:.ketorolac, ondansetron **OR** ondansetron (ZOFRAN) IV, polyethylene glycol  Allergies: No Known Allergies  Family History  Problem Relation Age of Onset  . Hypertension Mother   . Cancer Father        melanoma  . Cancer Sister   . Diabetes Paternal Grandfather     Social History:  reports that she has been smoking cigarettes. She has a 24.00 pack-year smoking history. She has never used smokeless tobacco. She reports that she does not drink alcohol or use drugs.  ROS: A complete review of systems was performed.  All systems are negative except for pertinent findings as noted.  Physical Exam:  Vital signs in last 24 hours: Temp:  [97.9 F (36.6 C)-101.1 F (38.4 C)] 101.1 F (38.4 C) (10/10 2213) Pulse Rate:  [53-96] 96 (10/10 2213) Resp:  [16-20] 18 (10/10 2213) BP: (109-128)/(71-87) 128/82 (10/10 2213) SpO2:  [96 %-99 %] 99 % (10/10 2213) Weight:  [78 kg] 78 kg (10/10 1304) Constitutional:  Alert and oriented Cardiovascular: Mild tachycardia Respiratory: Normal respiratory effort GI: Abdomen is soft, right side and right lower quadrant tenderness.  No tenderness on left abdomen.  GU: Right CVA tenderness present.  No left CVA tenderness. Lymphatic: No lymphadenopathy Neurologic: Grossly intact, no focal deficits Psychiatric: Normal mood and affect  Laboratory Data:  Recent Labs    04/20/19 1351  WBC 12.8*  HGB 11.6*  HCT 37.5  PLT 374    Recent Labs    04/20/19 1351  NA 136  K 3.9  CL 101  GLUCOSE 119*  BUN 6  CALCIUM 8.4*  CREATININE 0.87      Results for orders placed or performed during the hospital encounter of 04/20/19 (from the past 24 hour(s))  Urinalysis, Routine w reflex microscopic     Status: Abnormal   Collection Time: 04/20/19  1:18 PM  Result Value Ref Range   Color, Urine YELLOW YELLOW   APPearance HAZY (A) CLEAR   Specific Gravity, Urine 1.018 1.005 - 1.030   pH 5.0 5.0 - 8.0   Glucose, UA NEGATIVE NEGATIVE mg/dL   Hgb urine dipstick SMALL (A) NEGATIVE   Bilirubin Urine NEGATIVE NEGATIVE   Ketones, ur NEGATIVE NEGATIVE mg/dL   Protein, ur 30 (A) NEGATIVE mg/dL   Nitrite NEGATIVE NEGATIVE   Leukocytes,Ua MODERATE (A) NEGATIVE   RBC / HPF 0-5 0 - 5 RBC/hpf   WBC, UA >50 (H) 0 - 5 WBC/hpf   Bacteria, UA RARE (A) NONE SEEN   Squamous Epithelial / LPF 6-10 0 - 5   Mucus PRESENT   Pregnancy, urine     Status: None   Collection Time: 04/20/19  1:18 PM  Result Value Ref Range   Preg Test, Ur NEGATIVE NEGATIVE  CBC with Differential     Status: Abnormal   Collection Time: 04/20/19  1:51 PM  Result Value Ref Range   WBC 12.8 (H) 4.0 - 10.5 K/uL   RBC 4.49 3.87 - 5.11 MIL/uL   Hemoglobin 11.6 (L) 12.0 - 15.0 g/dL   HCT 56.2 13.0 - 86.5 %   MCV 83.5 80.0 - 100.0 fL   MCH 25.8 (L) 26.0 - 34.0 pg   MCHC 30.9 30.0 - 36.0 g/dL   RDW 78.4 69.6 - 29.5 %   Platelets 374 150 - 400 K/uL   nRBC 0.0 0.0 - 0.2 %   Neutrophils Relative % 80 %   Neutro Abs 10.1 (H) 1.7 - 7.7 K/uL   Lymphocytes Relative 11 %   Lymphs Abs 1.4 0.7 - 4.0 K/uL   Monocytes Relative 9 %   Monocytes Absolute 1.2 (H) 0.1 - 1.0 K/uL   Eosinophils Relative 0 %   Eosinophils Absolute 0.0 0.0 - 0.5 K/uL   Basophils Relative 0 %   Basophils Absolute 0.0 0.0 - 0.1 K/uL   Immature Granulocytes 0 %   Abs Immature Granulocytes 0.05 0.00 - 0.07 K/uL  Comprehensive metabolic panel     Status: Abnormal   Collection Time: 04/20/19  1:51 PM  Result Value Ref Range   Sodium 136 135 - 145 mmol/L   Potassium 3.9 3.5 - 5.1 mmol/L   Chloride 101 98  - 111 mmol/L   CO2 27 22 - 32 mmol/L   Glucose, Bld 119 (H) 70 - 99 mg/dL   BUN 6 6 - 20 mg/dL   Creatinine, Ser 2.84 0.44 - 1.00 mg/dL   Calcium 8.4 (L) 8.9 - 10.3 mg/dL   Total Protein 8.2 (H) 6.5 - 8.1 g/dL   Albumin 3.1 (L) 3.5 - 5.0 g/dL   AST 10 (L) 15 - 41 U/L   ALT 8 0 - 44 U/L  Alkaline Phosphatase 83 38 - 126 U/L   Total Bilirubin 0.2 (L) 0.3 - 1.2 mg/dL   GFR calc non Af Amer >60 >60 mL/min   GFR calc Af Amer >60 >60 mL/min   Anion gap 8 5 - 15  SARS Coronavirus 2 by RT PCR (hospital order, performed in [facility] hospital lab) Nasopharyngeal Nasopharyngeal Swab     Status: None   Collection Time: 04/20/19  4:38 PM   Specimen: Nasopharyngeal Swab  Result Value Ref Range   SARS Coronavirus 2 NEGATIVE NEGATIVE  APTT     Status: Abnormal   Collection Time: 04/20/19  6:33 PM  Result Value Ref Range   aPTT 38 (H) 24 - 36 seconds  Protime-INR     Status: Abnormal   Collection Time: 04/20/19  6:33 PM  Result Value Ref Range   Prothrombin Time 15.6 (H) 11.4 - 15.2 seconds   INR 1.3 (H) 0.8 - 1.2   Recent Results (from the past 240 hour(s))  SARS Coronavirus 2 by RT PCR (hospital order, performed in [facility] lab) Nasopharyngeal Nasopharyngeal Swab     Status: None   Collection Time: 04/20/19  4:38 PM   Specimen: Nasopharyngeal Swab  Result Value Ref Range Status   SARS Coronavirus 2 NEGATIVE NEGATIVE Final    Comment: (NOTE) If result is NEGATIVE SARS-CoV-2 target nucleic acids are NOT DETECTED. The SARS-CoV-2 RNA is generally detectable in upper and lower  respiratory specimens during the acute phase of infection. The lowest  concentration of SARS-CoV-2 viral copies this assay can detect is 250  copies / mL. A negative result does not preclude SARS-CoV-2 infection  and should not be used as the sole basis for treatment or other  patient management decisions.  A negative result may occur with  improper specimen collection / handling, submission of specimen  other  than nasopharyngeal swab, presence of viral mutation(s) within the  areas targeted by this assay, and inadequate number of viral copies  (<250 copies / mL). A negative result must be combined with clinical  observations, patient history, and epidemiological information. If result is POSITIVE SARS-CoV-2 target nucleic acids are DETECTED. The SARS-CoV-2 RNA is generally detectable in upper and lower  respiratory specimens dur ing the acute phase of infection.  Positive  results are indicative of active infection with SARS-CoV-2.  Clinical  correlation with patient history and other diagnostic information is  necessary to determine patient infection status.  Positive results do  not rule out bacterial infection or co-infection with other viruses. If result is PRESUMPTIVE POSTIVE SARS-CoV-2 nucleic acids MAY BE PRESENT.   A presumptive positive result was obtained on the submitted specimen  and confirmed on repeat testing.  While 2019 novel coronavirus  (SARS-CoV-2) nucleic acids may be present in the submitted sample  additional confirmatory testing may be necessary for epidemiological  and / or clinical management purposes  to differentiate between  SARS-CoV-2 and other Sarbecovirus currently known to infect humans.  If clinically indicated additional testing with an alternate test  methodology [id number]) is advised. The SARS-CoV-2 RNA is generally  detectable in upper and lower respiratory sp ecimens during the acute  phase of infection. The expected result is Negative. Fact Sheet for Patients:  BoilerBrush.com.cyhttps://www.fda.gov/media/136312/download Fact Sheet for Healthcare Providers: https://pope.com/https://www.fda.gov/media/136313/download This test is not yet approved or cleared by the [place] FDA and has been authorized for detection and/or diagnosis of SARS-CoV-2 by FDA under an Emergency Use Authorization (EUA).  This EUA will remain  in effect (meaning this test can be used) for the duration  of the COVID-19 declaration under Section 564(b)(1) of the Act, 21 U.S.C. section 360bbb-3(b)(1), unless the authorization is terminated or revoked sooner. Performed at [facility], [address] [place], [place] [address]     Renal Function: Recent Labs    04/20/19 1351  CREATININE 0.87   Estimated Creatinine Clearance: 84.9 mL/min (by C-G formula based on SCr of 0.87 mg/dL).  Radiologic Imaging: Ct Renal Stone Study  Result Date: 04/20/2019 CLINICAL DATA:  Right-sided flank pain for 1 day. EXAM: CT ABDOMEN AND PELVIS WITHOUT CONTRAST TECHNIQUE: Multidetector CT imaging of the abdomen and pelvis was performed following the standard protocol without IV contrast. COMPARISON:  September 06, 2008 FINDINGS: Lower chest: No acute abnormality. Hepatobiliary: No focal liver abnormality is seen. No gallstones, gallbladder wall thickening, or biliary dilatation. Pancreas: Unremarkable. No pancreatic ductal dilatation or surrounding inflammatory changes. Spleen: Normal in size without focal abnormality. Adrenals/Urinary Tract: Normal adrenal glands. Normal appearance of the left kidney, left ureter and urinary bladder. Markedly abnormal appearance of the right kidney. Numerous large right renal calculi, including a 12 mm calculus within the right renal pelvis at the UPJ. Abnormal hypoattenuated appearance of the right kidney likely represents severe hydronephrosis with thinning of the right renal cortex. Scattered foci of gas within the right renal parenchyma/collecting system are noted. Marked perinephric inflammatory changes. The right ureter is dilated with the proximal ureter measuring 18 mm. There is an obstructive 5.7 mm calculus in the distal right ureter, just shy of the vesicoureteral junction. Periureteral inflammatory changes also present. Stomach/Bowel: Stomach is within normal limits. No evidence of appendicitis. No evidence of bowel wall thickening, distention, or inflammatory changes.  Vascular/Lymphatic: Abnormal enlarged and rounded right retroperitoneal lymph nodes measure up to 19 mm in short axis. Reproductive: Uterus and bilateral adnexa are unremarkable. Other: No abdominal wall hernia or abnormality. No abdominopelvic ascites. Musculoskeletal: No acute osseous findings. Bilateral sacroiliitis. IMPRESSION: 1. 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 2. Numerous large right renal calculi, including a 12 mm probably obstructive calculus within the right renal pelvis at the UPJ. 5.7 mm obstructive calculus in the distal right ureter, just shy of the vesicoureteral junction. 3. Abnormal enlarged and rounded right retroperitoneal lymph nodes measure up to 19 mm in short axis, presumably reactive. Follow-up may be considered after resolution of patient's acute symptomatology to assure resolution. These results were called by telephone at the time of interpretation on 04/20/2019 at 4:13 pm to provider Marcene Brawn, who verbally acknowledged these results. Electronically Signed   By: Fidela Salisbury M.D.   On: 04/20/2019 16:23    I independently reviewed the above imaging studies.  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  Therefore, we recommend urgent placement of right nephrostomy tube with interventional radiology for decompression of upper tract; recommend sending urine culture from nephrostomy tube upon  placement as well.  Also recommend Foley catheter for maximal decompression of urinary system at this time.  Continue broad-spectrum antibiotics and follow-up urine cultures to adjust antibiotics as possible. Will eventually need follow-up with urology for definitive  stone treatment.  I have spoken to patient about indication for nephrostomy tube and plan for outpatient follow-up to determine next steps for stone treatment.  She is agreeable to plan.  I had spoken to IR on call consultant prior to patient's arrival to advise him of recommendation for nephrostomy tube as well.   Roxanne Gates 04/20/2019, 11:01 PM

## 2019-04-20 NOTE — ED Triage Notes (Signed)
Patient with right side flank pain for one day after eating at a restaurant.  Patient had bowel movement last night with no relief.  Patient states she has vomited once from the pain.  Patient denies urinary symptoms.

## 2019-04-21 ENCOUNTER — Encounter (HOSPITAL_COMMUNITY): Payer: Self-pay | Admitting: Interventional Radiology

## 2019-04-21 DIAGNOSIS — F17210 Nicotine dependence, cigarettes, uncomplicated: Secondary | ICD-10-CM

## 2019-04-21 DIAGNOSIS — F1190 Opioid use, unspecified, uncomplicated: Secondary | ICD-10-CM

## 2019-04-21 DIAGNOSIS — N136 Pyonephrosis: Secondary | ICD-10-CM

## 2019-04-21 DIAGNOSIS — Z881 Allergy status to other antibiotic agents status: Secondary | ICD-10-CM

## 2019-04-21 DIAGNOSIS — Z936 Other artificial openings of urinary tract status: Secondary | ICD-10-CM

## 2019-04-21 HISTORY — PX: IR NEPHROSTOMY PLACEMENT RIGHT: IMG6064

## 2019-04-21 LAB — CBC
HCT: 33.3 % — ABNORMAL LOW (ref 36.0–46.0)
Hemoglobin: 10.1 g/dL — ABNORMAL LOW (ref 12.0–15.0)
MCH: 26.1 pg (ref 26.0–34.0)
MCHC: 30.3 g/dL (ref 30.0–36.0)
MCV: 86 fL (ref 80.0–100.0)
Platelets: 320 10*3/uL (ref 150–400)
RBC: 3.87 MIL/uL (ref 3.87–5.11)
RDW: 12.8 % (ref 11.5–15.5)
WBC: 14.9 10*3/uL — ABNORMAL HIGH (ref 4.0–10.5)
nRBC: 0 % (ref 0.0–0.2)

## 2019-04-21 LAB — BASIC METABOLIC PANEL
Anion gap: 13 (ref 5–15)
BUN: 12 mg/dL (ref 6–20)
CO2: 20 mmol/L — ABNORMAL LOW (ref 22–32)
Calcium: 7.6 mg/dL — ABNORMAL LOW (ref 8.9–10.3)
Chloride: 105 mmol/L (ref 98–111)
Creatinine, Ser: 1.03 mg/dL — ABNORMAL HIGH (ref 0.44–1.00)
GFR calc Af Amer: >60 mL/min (ref >60)
GFR calc non Af Amer: >60 mL/min (ref >60)
Glucose, Bld: 66 mg/dL — ABNORMAL LOW (ref 70–99)
Potassium: 3.4 mmol/L — ABNORMAL LOW (ref 3.5–5.1)
Sodium: 138 mmol/L (ref 135–145)

## 2019-04-21 LAB — GLUCOSE, CAPILLARY: Glucose-Capillary: 184 mg/dL — ABNORMAL HIGH (ref 70–99)

## 2019-04-21 LAB — RENAL FUNCTION PANEL
Albumin: 2.6 g/dL — ABNORMAL LOW (ref 3.5–5.0)
Anion gap: 16 — ABNORMAL HIGH (ref 5–15)
BUN: 11 mg/dL (ref 6–20)
CO2: 18 mmol/L — ABNORMAL LOW (ref 22–32)
Calcium: 7.6 mg/dL — ABNORMAL LOW (ref 8.9–10.3)
Chloride: 105 mmol/L (ref 98–111)
Creatinine, Ser: 1.14 mg/dL — ABNORMAL HIGH (ref 0.44–1.00)
GFR calc Af Amer: >60 mL/min (ref >60)
GFR calc non Af Amer: >60 mL/min (ref >60)
Glucose, Bld: 58 mg/dL — ABNORMAL LOW (ref 70–99)
Phosphorus: 3.5 mg/dL (ref 2.5–4.6)
Potassium: 3.4 mmol/L — ABNORMAL LOW (ref 3.5–5.1)
Sodium: 139 mmol/L (ref 135–145)

## 2019-04-21 LAB — LACTIC ACID, PLASMA
Lactic Acid, Venous: 1.6 mmol/L (ref 0.5–1.9)
Lactic Acid, Venous: 2.7 mmol/L (ref 0.5–1.9)

## 2019-04-21 LAB — MAGNESIUM: Magnesium: 1.7 mg/dL (ref 1.7–2.4)

## 2019-04-21 LAB — MRSA PCR SCREENING: MRSA by PCR: NEGATIVE

## 2019-04-21 MED ORDER — OXYCODONE HCL 5 MG PO TABS
5.0000 mg | ORAL_TABLET | ORAL | Status: DC | PRN
  Filled 2019-04-21: qty 1

## 2019-04-21 MED ORDER — VANCOMYCIN HCL IN DEXTROSE 750-5 MG/150ML-% IV SOLN: 750.0000 mg | Freq: Two times a day (BID) | INTRAVENOUS | Status: DC

## 2019-04-21 MED ORDER — HYDROCODONE-ACETAMINOPHEN 5-325 MG PO TABS: 1.0000 | ORAL_TABLET | ORAL | Status: DC | PRN

## 2019-04-21 MED ORDER — ALUM & MAG HYDROXIDE-SIMETH 200-200-20 MG/5ML PO SUSP
30.0000 mL | ORAL | Status: DC | PRN
  Administered 2019-04-21: 22:00:00 30 mL via ORAL
  Filled 2019-04-21: qty 30

## 2019-04-21 MED ORDER — VANCOMYCIN HCL IN DEXTROSE 1-5 GM/200ML-% IV SOLN: 1000.0000 mg | INTRAVENOUS | Status: DC

## 2019-04-21 MED ORDER — FLUCONAZOLE IN SODIUM CHLORIDE 400-0.9 MG/200ML-% IV SOLN
400.0000 mg | INTRAVENOUS | Status: DC
  Administered 2019-04-22: 03:00:00 400 mg via INTRAVENOUS
  Filled 2019-04-21 (×2): qty 200

## 2019-04-21 MED ORDER — KETOROLAC TROMETHAMINE 30 MG/ML IJ SOLN
60.0000 mg | Freq: Once | INTRAMUSCULAR | Status: AC
  Administered 2019-04-21: 60 mg via INTRAMUSCULAR
  Filled 2019-04-21: qty 2

## 2019-04-21 MED ORDER — ACETAMINOPHEN 325 MG PO TABS
650.0000 mg | ORAL_TABLET | Freq: Four times a day (QID) | ORAL | Status: DC | PRN
  Administered 2019-04-21 – 2019-04-24 (×3): 650 mg via ORAL
  Filled 2019-04-21 (×3): qty 2

## 2019-04-21 MED ORDER — SODIUM CHLORIDE 0.9% FLUSH
5.0000 mL | Freq: Three times a day (TID) | INTRAVENOUS | Status: DC
  Administered 2019-04-21 – 2019-04-24 (×11): 5 mL

## 2019-04-21 MED ORDER — SODIUM CHLORIDE 0.9 % IV BOLUS
1000.0000 mL | Freq: Once | INTRAVENOUS | Status: AC
  Administered 2019-04-21: 12:00:00 1000 mL via INTRAVENOUS

## 2019-04-21 MED ORDER — VANCOMYCIN HCL 10 G IV SOLR
1750.0000 mg | Freq: Once | INTRAVENOUS | Status: DC
  Administered 2019-04-21: 1750 mg via INTRAVENOUS
  Filled 2019-04-21: qty 1750

## 2019-04-21 MED ORDER — CHLORHEXIDINE GLUCONATE CLOTH 2 % EX PADS
6.0000 | MEDICATED_PAD | Freq: Every day | CUTANEOUS | Status: DC
  Administered 2019-04-21 – 2019-04-23 (×3): 6 via TOPICAL

## 2019-04-21 MED ORDER — SODIUM CHLORIDE 0.9 % IV BOLUS
250.0000 mL | Freq: Once | INTRAVENOUS | Status: AC
  Administered 2019-04-21: 08:00:00 250 mL via INTRAVENOUS

## 2019-04-21 MED ORDER — SODIUM CHLORIDE 0.9 % IV SOLN
1.0000 g | Freq: Three times a day (TID) | INTRAVENOUS | Status: DC
  Administered 2019-04-21 – 2019-04-23 (×6): 1 g via INTRAVENOUS
  Filled 2019-04-21 (×8): qty 1

## 2019-04-21 MED ORDER — SODIUM CHLORIDE 0.9 % IV BOLUS
1000.0000 mL | Freq: Once | INTRAVENOUS | Status: AC
  Administered 2019-04-21: 1000 mL via INTRAVENOUS

## 2019-04-21 NOTE — Progress Notes (Signed)
PROGRESS NOTE    Pamela Horn  EHU:[id number] DOB: [birth year] DOA: 04/20/2019 PCP: Lemmie Evens, MD  Outpatient Specialists:   Brief Narrative: Patient is a 38 year old female with past medical history significant for opioid use disorder on Suboxone and nicotine abuse.  Patient was admitted with right flank pain.  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  Patient has undergone right-sided percutaneous nephrostomy tube placement today.  Input from urology team is appreciated.  Infectious disease team has been consulted for antibiotics management.  Postop, patient has been hypotensive.  Patient will be transferred to stepdown unit for sepsis management.  Patient has been aggressively volume resuscitated.  IV antibiotics has been changed to meropenem.  Further management will depend on hospital course.   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   -Follow cultures  -Continue IV antibiotics (meropenem)  -Input from ID team is highly appreciated  -Patient has been volume resuscitated.   -Urology has performed right nephrostomy tube placement for right-sided hydronephrosis.   -Management will depend on hospital course.    Right-sided hydronephrosis with renal and ureteral calculus obstruction: -Patient has undergone right-sided percutaneous nephrostomy catheter placement. -Continue adequate hydration. -Urology input is appreciated.  Opioid use disorder: Continue Suboxone.  Tobacco use disorder: Nicotine patch.  Further management will depend on hospital course.   DVT prophylaxis: SCD.  Consider Lovenox when okay with urology Code Status: Full code Family Communication:  Disposition Plan: Home  eventually   Consultants:   Urology  Infectious disease team  Procedures:   Percutaneous nephrostomy catheter placement  Antimicrobials:   IV meropenem   Subjective: Fever and right flank pain  Objective: Vitals:   04/21/19 0419 04/21/19 0625 04/21/19 0628 04/21/19 1045  BP: 108/67 (!) 85/58 (!) 92/59 90/64  Pulse: (!) 108 (!) 102 97 97  Resp: 18 20  16   Temp: (!) 102 F (38.9 C) 99 F (37.2 C)  98.1 F (36.7 C)  TempSrc: Oral Oral  Oral  SpO2: 92% 93% 93% 97%  Weight:      Height:        Intake/Output Summary (Last 24 hours) at 04/21/2019 1109 Last data filed at 04/21/2019 7858 Gross per 24 hour  Intake 10 ml  Output 25 ml  Net -15 ml   Filed Weights   04/20/19 1304  Weight: 78 kg    Examination:  General exam: Appears calm and comfortable  Respiratory system: Clear to auscultation.  Cardiovascular system: S1 & S2 heard Gastrointestinal system: Abdomen is nondistended, soft and nontender. No organomegaly or masses felt. Normal bowel sounds heard. Central nervous system: Awake and alert.  Patient moves all extremities.   Extremities: No leg edema.  Data Reviewed: I have personally reviewed following labs and imaging studies  CBC: Recent Labs  Lab 04/20/19 1351  WBC 12.8*  NEUTROABS 10.1*  HGB 11.6*  HCT 37.5  MCV 83.5  PLT 850   Basic Metabolic Panel: Recent Labs  Lab 04/20/19 1351  NA 136  K 3.9  CL 101  CO2 27  GLUCOSE 119*  BUN 6  CREATININE 0.87  CALCIUM 8.4*   GFR: Estimated Creatinine Clearance: 84.9 mL/min (by C-G formula based on SCr of 0.87 mg/dL). Liver Function Tests: Recent Labs  Lab 04/20/19 1351  AST 10*  ALT 8  ALKPHOS 83  BILITOT 0.2*  PROT 8.2*  ALBUMIN 3.1*   No results for input(s): LIPASE, AMYLASE in the last 168 hours. No results for input(s): AMMONIA in the last 168 hours. Coagulation Profile: Recent Labs  Lab 04/20/19 1833  INR 1.3*   Cardiac Enzymes: No results for input(s): CKTOTAL,  CKMB, CKMBINDEX, TROPONINI in the last 168 hours. BNP (last 3 results) No results for input(s): PROBNP in the last 8760 hours. HbA1C: No results for input(s): HGBA1C in the last 72 hours. CBG: No results for input(s): GLUCAP in the last 168 hours. Lipid Profile: No results for input(s): CHOL, HDL, LDLCALC, TRIG, CHOLHDL, LDLDIRECT in the last 72 hours. Thyroid Function Tests: No results for input(s): TSH, T4TOTAL, FREET4, T3FREE, THYROIDAB in the last 72 hours. Anemia Panel: No results for input(s): VITAMINB12, FOLATE, FERRITIN, TIBC, IRON, RETICCTPCT in the last 72 hours. Urine analysis:    Component Value Date/Time   COLORURINE YELLOW 04/20/2019 1318   APPEARANCEUR HAZY (A) 04/20/2019 1318   LABSPEC 1.018 04/20/2019 1318   PHURINE 5.0 04/20/2019 1318   GLUCOSEU NEGATIVE 04/20/2019 1318   HGBUR SMALL (A) 04/20/2019 1318   BILIRUBINUR NEGATIVE 04/20/2019 1318   KETONESUR NEGATIVE 04/20/2019 1318   PROTEINUR 30 (A) 04/20/2019 1318   UROBILINOGEN 0.2 02/19/2015 1500   NITRITE NEGATIVE 04/20/2019 1318   LEUKOCYTESUR MODERATE (A) 04/20/2019 1318   Sepsis Labs: @LABRCNTIP (procalcitonin:4,lacticidven:4)  ) Recent Results (from the past 240 hour(s))  SARS Coronavirus 2 by RT PCR (hospital order, performed in [facility] hospital lab) Nasopharyngeal Nasopharyngeal Swab     Status: None   Collection Time: 04/20/19  4:38 PM   Specimen: Nasopharyngeal Swab  Result Value Ref Range Status   SARS Coronavirus 2 NEGATIVE NEGATIVE Final    Comment: (NOTE) If result is NEGATIVE SARS-CoV-2 target nucleic acids are NOT DETECTED. The SARS-CoV-2 RNA is generally detectable in upper and lower  respiratory specimens during the acute phase of infection. The lowest  concentration of SARS-CoV-2 viral copies this assay can detect is 250  copies / mL. A negative result does not preclude SARS-CoV-2 infection  and should not be used as the sole basis for treatment or other  patient management  decisions.  A negative result may occur with  improper specimen collection / handling, submission of specimen other  than nasopharyngeal swab, presence of viral mutation(s) within the  areas targeted by this assay, and inadequate number of viral copies  (<250 copies / mL). A negative result must be combined with clinical  observations, patient history, and epidemiological information. If result is POSITIVE SARS-CoV-2 target nucleic acids are DETECTED. The SARS-CoV-2 RNA is generally detectable in upper and lower  respiratory specimens dur ing the acute phase of infection.  Positive  results are indicative of active infection with SARS-CoV-2.  Clinical  correlation with patient history and other diagnostic information is  necessary to determine patient infection status.  Positive results do  not rule out bacterial infection or co-infection with other viruses. If result is PRESUMPTIVE POSTIVE SARS-CoV-2 nucleic acids MAY BE PRESENT.   A presumptive positive result was obtained on the submitted specimen  and confirmed on repeat testing.  While 2019 novel coronavirus  (SARS-CoV-2) nucleic acids may be present in the submitted sample  additional confirmatory testing may be necessary for epidemiological  and / or clinical management purposes  to differentiate between  SARS-CoV-2 and other Sarbecovirus currently known to infect humans.  If clinically indicated additional testing with an alternate  test  methodology [phone]) is advised. The SARS-CoV-2 RNA is generally  detectable in upper and lower respiratory sp ecimens during the acute  phase of infection. The expected result is Negative. Fact Sheet for Patients:  BoilerBrush.com.cyhttps://www.fda.gov/media/136312/download Fact Sheet for Healthcare Providers: https://pope.com/https://www.fda.gov/media/136313/download This test is not yet approved or cleared by the [place] FDA and has been authorized for detection and/or diagnosis of SARS-CoV-2 by FDA under an  Emergency Use Authorization (EUA).  This EUA will remain in effect (meaning this test can be used) for the duration of the COVID-19 declaration under Section 564(b)(1) of the Act, 21 U.S.C. section 360bbb-3(b)(1), unless the authorization is terminated or revoked sooner. Performed at [facility], [address] [place], [place] [address]          Radiology Studies: Ct Renal Stone Study  Result Date: 04/20/2019 CLINICAL DATA:  Right-sided flank pain for 1 day. EXAM: CT ABDOMEN AND PELVIS WITHOUT CONTRAST TECHNIQUE: Multidetector CT imaging of the abdomen and pelvis was performed following the standard protocol without IV contrast. COMPARISON:  September 06, 2008 FINDINGS: Lower chest: No acute abnormality. Hepatobiliary: No focal liver abnormality is seen. No gallstones, gallbladder wall thickening, or biliary dilatation. Pancreas: Unremarkable. No pancreatic ductal dilatation or surrounding inflammatory changes. Spleen: Normal in size without focal abnormality. Adrenals/Urinary Tract: Normal adrenal glands. Normal appearance of the left kidney, left ureter and urinary bladder. Markedly abnormal appearance of the right kidney. Numerous large right renal calculi, including a 12 mm calculus within the right renal pelvis at the UPJ. Abnormal hypoattenuated appearance of the right kidney likely represents severe hydronephrosis with thinning of the right renal cortex. Scattered foci of gas within the right renal parenchyma/collecting system are noted. Marked perinephric inflammatory changes. The right ureter is dilated with the proximal ureter measuring 18 mm. There is an obstructive 5.7 mm calculus in the distal right ureter, just shy of the vesicoureteral junction. Periureteral inflammatory changes also present. Stomach/Bowel: Stomach is within normal limits. No evidence of appendicitis. No evidence of bowel wall thickening, distention, or inflammatory changes. Vascular/Lymphatic: Abnormal enlarged and  rounded right retroperitoneal lymph nodes measure up to 19 mm in short axis. Reproductive: Uterus and bilateral adnexa are unremarkable. Other: No abdominal wall hernia or abnormality. No abdominopelvic ascites. Musculoskeletal: No acute osseous findings. Bilateral sacroiliitis. IMPRESSION: 1. 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 2. Numerous large right renal calculi, including a 12 mm probably obstructive calculus within the right renal pelvis at the UPJ. 5.7 mm obstructive calculus in the distal right ureter, just shy of the vesicoureteral junction. 3. Abnormal enlarged and rounded right retroperitoneal lymph nodes measure up to 19 mm in short axis, presumably reactive. Follow-up may be considered after resolution of patient's acute symptomatology to assure resolution. These results were called by telephone at the time of interpretation on 04/20/2019 at 4:13 pm to provider Trisha MangleKaren Sophia, who verbally acknowledged these results. Electronically Signed   By: Ted Mcalpineobrinka  Dimitrova M.D.   On: 04/20/2019 16:23   Ir Nephrostomy Placement Right  Result Date: 04/21/2019 CLINICAL DATA:  Right nephrolithiasis, suspected pyonephrosis, sepsis EXAM: RIGHT PERCUTANEOUS NEPHROSTOMY CATHETER PLACEMENT UNDER ULTRASOUND AND FLUOROSCOPIC GUIDANCE FLUOROSCOPY TIME:  0.5 minute; 110 uGym2 DAP TECHNIQUE: The procedure, risks (including but not limited to bleeding, infection, organ damage ), benefits, and alternatives were explained to the patient. Questions regarding the procedure were encouraged and answered. The  patient understands and consents to the procedure. Rightflank region prepped with chlorhexidine, draped in usual sterile fashion, infiltrated locally with 1% lidocaine. The patient was already receiving  adequate prophylactic antibiotic coverage. Intravenous Fentanyl and Versed  were administered as conscious sedation during continuous monitoring of the patient's level of consciousness and physiological / cardiorespiratory status by the radiology RN, with a total moderate sedation time of 13 minutes. Under real-time ultrasound guidance, a 21-gauge trocar needle was advanced into a posterior lower pole calyx. Ultrasound image documentation was saved. Urine spontaneously returned through the needle. Needle was exchanged over a guidewire for transitional dilator. Contrast injection confirmed appropriate positioning. Catheter was exchanged over a guidewire for a 10 French pigtail catheter, formed centrally within the right renal collecting system. Contrast injection confirms appropriate positioning and patency. Multiple radiodense renal calculi noted. Catheter secured externally with 0 Prolene suture and placed to external drain bag. COMPLICATIONS: COMPLICATIONS none IMPRESSION: 1. Technically successful right percutaneous nephrostomy catheter placement. Electronically Signed   By: Corlis Leak M.D.   On: 04/21/2019 07:45        Scheduled Meds:  buprenorphine-naloxone  1 tablet Sublingual BID   fentaNYL       lidocaine       lidocaine       midazolam       nicotine  14 mg Transdermal Daily   sodium chloride flush  5 mL Intracatheter Q8H   Continuous Infusions:  sodium chloride 100 mL/hr at 04/20/19 2053   cefTRIAXone (ROCEPHIN)  IV Stopped (04/20/19 1906)     LOS: 1 day    Time spent: 35 minutes   Berton Mount, MD  [facility] Pager #: [phone] 7PM-7AM contact night coverage as above

## 2019-04-21 NOTE — Consult Note (Addendum)
[facility] for Infectious Disease       Reason for Consult: pyelonephritis     Referring Physician: Dr. Marthenia Rolling  Principal Problem:   Pyelonephritis Active Problems:   Smoker   Hydronephrosis with renal and ureteral calculus obstruction   Opioid use disorder ([place])   . buprenorphine-naloxone  1 tablet Sublingual BID  . Chlorhexidine Gluconate Cloth  6 each Topical Daily  . nicotine  14 mg Transdermal Daily  . sodium chloride flush  5 mL Intracatheter Q8H    Recommendations: Continue meropenem for now Will stop vancomycin and fluconazole  Assessment: She has emphysematous pyelonephritis and has required right percutaneous nephrostomy catheter placement done today.  Culture with GNR and GPC in pairs, not c/w MRSA so will stop vancomycin.    Antibiotics: vancomcycin and meropenem  HPI: Pamela Horn is a 38 y.o. female with opioid use disorder on suboxone with right sided abdominal pain and found to have an obstructing uretral stone.  Underwent above procedure and on broad spectrum antibiotics.  Cultures pending with gram stain noted.  She feels better overall compared to admission. Tmax 102, WBC 14.9.  Previous culture in urine with E coli. No associated rash or diarrhea.     Review of Systems:  Constitutional: negative for fevers and chills Gastrointestinal: negative for nausea and diarrhea Integument/breast: negative for rash Musculoskeletal: negative for myalgias and arthralgias All other systems reviewed and are negative    Past Medical History:  Diagnosis Date  . Pre-eclampsia    1st pregnancy  . Pregnancy induced hypertension     Social History   Tobacco Use  . Smoking status: Current Every Day Smoker    Packs/day: 1.00    Years: 24.00    Pack years: 24.00    Types: Cigarettes  . Smokeless tobacco: Never Used  Substance Use Topics  . Alcohol use: No  . Drug use: No    Family History  Problem Relation Age of Onset  . Hypertension Mother    . Cancer Father        melanoma  . Cancer Sister   . Diabetes Paternal Grandfather     Allergies  Allergen Reactions  . Bactrim [Sulfamethoxazole-Trimethoprim]     Redness, itching, burning, blotchy feet and hands    Physical Exam: Constitutional: in no apparent distress  Vitals:   04/21/19 1603 04/21/19 1657  BP: 94/66 91/63  Pulse: 83 85  Resp:  16  Temp:  98.2 F (36.8 C)  SpO2:  99%   EYES: anicteric Cardiovascular: Cor RRR Respiratory: CTA B; normal respiratory effort GI: soft Musculoskeletal: no pedal edema noted Skin: negatives: no rash Neuro: non-focal  Lab Results  Component Value Date   WBC 14.9 (H) 04/21/2019   HGB 10.1 (L) 04/21/2019   HCT 33.3 (L) 04/21/2019   MCV 86.0 04/21/2019   PLT 320 04/21/2019    Lab Results  Component Value Date   CREATININE 1.14 (H) 04/21/2019   BUN 11 04/21/2019   NA 139 04/21/2019   K 3.4 (L) 04/21/2019   CL 105 04/21/2019   CO2 18 (L) 04/21/2019    Lab Results  Component Value Date   ALT 8 04/20/2019   AST 10 (L) 04/20/2019   ALKPHOS 83 04/20/2019     Microbiology: Recent Results (from the past 240 hour(s))  SARS Coronavirus 2 by RT PCR (hospital order, performed in [facility] hospital lab) Nasopharyngeal Nasopharyngeal Swab     Status: None   Collection Time: 04/20/19  4:38 PM   Specimen: Nasopharyngeal Swab  Result Value Ref Range Status   SARS Coronavirus 2 NEGATIVE NEGATIVE Final    Comment: (NOTE) If result is NEGATIVE SARS-CoV-2 target nucleic acids are NOT DETECTED. The SARS-CoV-2 RNA is generally detectable in upper and lower  respiratory specimens during the acute phase of infection. The lowest  concentration of SARS-CoV-2 viral copies this assay can detect is 250  copies / mL. A negative result does not preclude SARS-CoV-2 infection  and should not be used as the sole basis for treatment or other  patient management decisions.  A negative result may occur with  improper specimen collection /  handling, submission of specimen other  than nasopharyngeal swab, presence of viral mutation(s) within the  areas targeted by this assay, and inadequate number of viral copies  (<250 copies / mL). A negative result must be combined with clinical  observations, patient history, and epidemiological information. If result is POSITIVE SARS-CoV-2 target nucleic acids are DETECTED. The SARS-CoV-2 RNA is generally detectable in upper and lower  respiratory specimens dur ing the acute phase of infection.  Positive  results are indicative of active infection with SARS-CoV-2.  Clinical  correlation with patient history and other diagnostic information is  necessary to determine patient infection status.  Positive results do  not rule out bacterial infection or co-infection with other viruses. If result is PRESUMPTIVE POSTIVE SARS-CoV-2 nucleic acids MAY BE PRESENT.   A presumptive positive result was obtained on the submitted specimen  and confirmed on repeat testing.  While 2019 novel coronavirus  (SARS-CoV-2) nucleic acids may be present in the submitted sample  additional confirmatory testing may be necessary for epidemiological  and / or clinical management purposes  to differentiate between  SARS-CoV-2 and other Sarbecovirus currently known to infect humans.  If clinically indicated additional testing with an alternate test  methodology [phone]) is advised. The SARS-CoV-2 RNA is generally  detectable in upper and lower respiratory sp ecimens during the acute  phase of infection. The expected result is Negative. Fact Sheet for Patients:  BoilerBrush.com.cy Fact Sheet for Healthcare Providers: https://pope.com/ This test is not yet approved or cleared by the [place] FDA and has been authorized for detection and/or diagnosis of SARS-CoV-2 by FDA under an Emergency Use Authorization (EUA).  This EUA will remain in effect (meaning this  test can be used) for the duration of the COVID-19 declaration under Section 564(b)(1) of the Act, 21 U.S.C. section 360bbb-3(b)(1), unless the authorization is terminated or revoked sooner. Performed at [facility], [address] [place], [place] [address]   Body fluid culture     Status: None (Preliminary result)   Collection Time: 04/21/19 12:01 AM   Specimen: Urine, Random  Result Value Ref Range Status   Specimen Description   Final    URINE, RANDOM Performed at [facility], [address], [place], [place] [address]    Special Requests   Final    RIGHT KIDNEY Performed at [facility], [address], [place], [place] [address]    Gram Stain   Final    FEW WBC PRESENT, PREDOMINANTLY PMN ABUNDANT GRAM NEGATIVE RODS ABUNDANT GRAM POSITIVE COCCI IN PAIRS Performed at [facility], [address], [place], [place] [address]    Culture PENDING  Incomplete   Report Status PENDING  Incomplete    Gardiner Barefoot, MD [facility] [email or web address] 04/21/2019, 5:39 PM

## 2019-04-21 NOTE — Procedures (Signed)
  Procedure: RIGHT perc nephrostomy catheter placement 10f EBL:   minimal Complications:  none immediate  See full dictation in [facility] PACS.  D. Dynastie Knoop MD Main # [phone] Pager  [phone]    

## 2019-04-21 NOTE — Progress Notes (Signed)
Foley catheter inserted per Dr. Graylon Good verbal order. Peri care performed before insertion. Cloudy amber urine return upon placement. Purulent discharge present after insertion. Peri and foley care performed again. Patient tolerated procedure fair. Patient is having severe right flank pain. Medicated patient per MAR. Will continue to monitor.

## 2019-04-21 NOTE — Progress Notes (Signed)
Subjective: Patient states that she is feeling overall better since placement of right nephrostomy tube overnight.  However, she does continue to spike fevers and was hypotensive this morning.   Objective: Vital signs in last 24 hours: Temp:  [97.9 F (36.6 C)-102 F (38.9 C)] 99 F (37.2 C) (10/11 0625) Pulse Rate:  [53-108] 97 (10/11 0628) Resp:  [15-20] 20 (10/11 0625) BP: (85-129)/(58-87) 92/59 (10/11 0628) SpO2:  [92 %-99 %] 93 % (10/11 0628) Weight:  [78 kg] 78 kg (10/10 1304)  Intake/Output from previous day: 10/10 0701 - 10/11 0700 In: 10  Out: 25 [Drains:25] Intake/Output this shift: No intake/output data recorded.  Physical Exam:  General: Alert and oriented CV: Mildly tachycardic Lungs: Normal work of breathing Abdomen: Soft, ND GU: Right nephrostomy tube in place draining frank purulent red-tan fluid with over 100 cc in the bag.  Foley catheter in place to drainage with 400 cc of amber urine in the bag.  Lab Results: Recent Labs    04/20/19 1351  HGB 11.6*  HCT 37.5   BMET Recent Labs    04/20/19 1351  NA 136  K 3.9  CL 101  CO2 27  GLUCOSE 119*  BUN 6  CREATININE 0.87  CALCIUM 8.4*     Studies/Results: Ct Renal Stone Study  Result Date: 04/20/2019 CLINICAL DATA:  Right-sided flank pain for 1 day. EXAM: CT ABDOMEN AND PELVIS WITHOUT CONTRAST TECHNIQUE: Multidetector CT imaging of the abdomen and pelvis was performed following the standard protocol without IV contrast. COMPARISON:  September 06, 2008 FINDINGS: Lower chest: No acute abnormality. Hepatobiliary: No focal liver abnormality is seen. No gallstones, gallbladder wall thickening, or biliary dilatation. Pancreas: Unremarkable. No pancreatic ductal dilatation or surrounding inflammatory changes. Spleen: Normal in size without focal abnormality. Adrenals/Urinary Tract: Normal adrenal glands. Normal appearance of the left kidney, left ureter and urinary bladder. Markedly abnormal appearance of  the right kidney. Numerous large right renal calculi, including a 12 mm calculus within the right renal pelvis at the UPJ. Abnormal hypoattenuated appearance of the right kidney likely represents severe hydronephrosis with thinning of the right renal cortex. Scattered foci of gas within the right renal parenchyma/collecting system are noted. Marked perinephric inflammatory changes. The right ureter is dilated with the proximal ureter measuring 18 mm. There is an obstructive 5.7 mm calculus in the distal right ureter, just shy of the vesicoureteral junction. Periureteral inflammatory changes also present. Stomach/Bowel: Stomach is within normal limits. No evidence of appendicitis. No evidence of bowel wall thickening, distention, or inflammatory changes. Vascular/Lymphatic: Abnormal enlarged and rounded right retroperitoneal lymph nodes measure up to 19 mm in short axis. Reproductive: Uterus and bilateral adnexa are unremarkable. Other: No abdominal wall hernia or abnormality. No abdominopelvic ascites. Musculoskeletal: No acute osseous findings. Bilateral sacroiliitis. IMPRESSION: 1. 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 2. Numerous large right renal calculi, including a 12 mm probably obstructive calculus within the right renal pelvis at the UPJ. 5.7 mm obstructive calculus in the distal right ureter, just shy of the vesicoureteral junction. 3. Abnormal enlarged and rounded right retroperitoneal lymph nodes measure up to 19 mm in short axis, presumably reactive. Follow-up may be considered after resolution of patient's acute symptomatology to assure resolution. These results were called by telephone at the time of interpretation on 04/20/2019 at 4:13 pm to provider Trisha Mangle, who verbally  acknowledged  these results. Electronically Signed   By: Fidela Salisbury M.D.   On: 04/20/2019 16:23   Ir Nephrostomy Placement Right  Result Date: 04/21/2019 CLINICAL DATA:  Right nephrolithiasis, suspected pyonephrosis, sepsis EXAM: RIGHT PERCUTANEOUS NEPHROSTOMY CATHETER PLACEMENT UNDER ULTRASOUND AND FLUOROSCOPIC GUIDANCE FLUOROSCOPY TIME:  0.5 minute; 110 uGym2 DAP TECHNIQUE: The procedure, risks (including but not limited to bleeding, infection, organ damage ), benefits, and alternatives were explained to the patient. Questions regarding the procedure were encouraged and answered. The patient understands and consents to the procedure. Rightflank region prepped with chlorhexidine, draped in usual sterile fashion, infiltrated locally with 1% lidocaine. The patient was already receiving adequate prophylactic antibiotic coverage. Intravenous Fentanyl 122mcg and Versed 2mg  were administered as conscious sedation during continuous monitoring of the patient's level of consciousness and physiological / cardiorespiratory status by the radiology RN, with a total moderate sedation time of 13 minutes. Under real-time ultrasound guidance, a 21-gauge trocar needle was advanced into a posterior lower pole calyx. Ultrasound image documentation was saved. Urine spontaneously returned through the needle. Needle was exchanged over a guidewire for transitional dilator. Contrast injection confirmed appropriate positioning. Catheter was exchanged over a guidewire for a 10 French pigtail catheter, formed centrally within the right renal collecting system. Contrast injection confirms appropriate positioning and patency. Multiple radiodense renal calculi noted. Catheter secured externally with 0 Prolene suture and placed to external drain bag. COMPLICATIONS: COMPLICATIONS none IMPRESSION: 1. Technically successful right percutaneous nephrostomy catheter placement. Electronically Signed   By: Lucrezia Europe M.D.   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  Output from nephrostomy tube looks purulent and highly suspicious for severe infection.  Nephrostomy tube and Foley catheter appear to be patent.  Urine culture and nephrostomy tube culture pending.  Agree with continuation of broad antibiotics and sepsis management per primary team.  Continue nephrostomy tube and Foley catheter to drainage.  Recommend daily labs, which I went ahead and ordered as these had not been ordered for today.  She will ultimately need outpatient follow-up for definitive stone management.  She is aware of this plan.  She remains highly anxious given social situation, need for Suboxone therapy given her opioid use disorder, and lack of insurance.  We will defer these issues to the primary team but she will likely benefit from social work consult.  Please advise [place] of any questions or concerns.   LOS: 1 day   Haskel Schroeder 04/21/2019, 10:07 AM

## 2019-04-21 NOTE — Progress Notes (Signed)
MD Ogbata made aware of patient's BP 90/64, HR 97. Pt asymptomatic, c/o severe pain to right flank. PRN Toradol given. 1L NS bolus infusing at this time per MD order. Will continue to monitor BP.

## 2019-04-21 NOTE — Progress Notes (Signed)
BP rechecked after first NS 1L bolus was given. BP now decreased to 85/59, HR 80. Pt remains asymptomatic, resting comfortably in bed. MD Ogbata on floor, made aware of BP. A second 1L NS bolus is being given now per MD. Will continue to monitor.

## 2019-04-22 DIAGNOSIS — D72829 Elevated white blood cell count, unspecified: Secondary | ICD-10-CM

## 2019-04-22 DIAGNOSIS — N12 Tubulo-interstitial nephritis, not specified as acute or chronic: Secondary | ICD-10-CM

## 2019-04-22 DIAGNOSIS — N1330 Unspecified hydronephrosis: Secondary | ICD-10-CM

## 2019-04-22 DIAGNOSIS — B9620 Unspecified Escherichia coli [E. coli] as the cause of diseases classified elsewhere: Secondary | ICD-10-CM

## 2019-04-22 LAB — CBC WITH DIFFERENTIAL/PLATELET
Abs Immature Granulocytes: 0.17 10*3/uL — ABNORMAL HIGH (ref 0.00–0.07)
Basophils Absolute: 0 10*3/uL (ref 0.0–0.1)
Basophils Relative: 0 %
Eosinophils Absolute: 0.1 10*3/uL (ref 0.0–0.5)
Eosinophils Relative: 0 %
HCT: 31.8 % — ABNORMAL LOW (ref 36.0–46.0)
Hemoglobin: 9.8 g/dL — ABNORMAL LOW (ref 12.0–15.0)
Immature Granulocytes: 1 %
Lymphocytes Relative: 8 %
Lymphs Abs: 1 10*3/uL (ref 0.7–4.0)
MCH: 26.4 pg (ref 26.0–34.0)
MCHC: 30.8 g/dL (ref 30.0–36.0)
MCV: 85.7 fL (ref 80.0–100.0)
Monocytes Absolute: 0.3 10*3/uL (ref 0.1–1.0)
Monocytes Relative: 3 %
Neutro Abs: 11.8 10*3/uL — ABNORMAL HIGH (ref 1.7–7.7)
Neutrophils Relative %: 88 %
Platelets: 303 10*3/uL (ref 150–400)
RBC: 3.71 MIL/uL — ABNORMAL LOW (ref 3.87–5.11)
RDW: 13 % (ref 11.5–15.5)
WBC: 13.4 10*3/uL — ABNORMAL HIGH (ref 4.0–10.5)
nRBC: 0 % (ref 0.0–0.2)

## 2019-04-22 LAB — BASIC METABOLIC PANEL
Anion gap: 9 (ref 5–15)
BUN: 14 mg/dL (ref 6–20)
CO2: 21 mmol/L — ABNORMAL LOW (ref 22–32)
Calcium: 7.1 mg/dL — ABNORMAL LOW (ref 8.9–10.3)
Chloride: 109 mmol/L (ref 98–111)
Creatinine, Ser: 0.92 mg/dL (ref 0.44–1.00)
GFR calc Af Amer: >60 mL/min (ref >60)
GFR calc non Af Amer: >60 mL/min (ref >60)
Glucose, Bld: 124 mg/dL — ABNORMAL HIGH (ref 70–99)
Potassium: 3.6 mmol/L (ref 3.5–5.1)
Sodium: 139 mmol/L (ref 135–145)

## 2019-04-22 LAB — RENAL FUNCTION PANEL
Albumin: 2.2 g/dL — ABNORMAL LOW (ref 3.5–5.0)
Anion gap: 10 (ref 5–15)
BUN: 14 mg/dL (ref 6–20)
CO2: 20 mmol/L — ABNORMAL LOW (ref 22–32)
Calcium: 7.1 mg/dL — ABNORMAL LOW (ref 8.9–10.3)
Chloride: 107 mmol/L (ref 98–111)
Creatinine, Ser: 0.95 mg/dL (ref 0.44–1.00)
GFR calc Af Amer: >60 mL/min (ref >60)
GFR calc non Af Amer: >60 mL/min (ref >60)
Glucose, Bld: 120 mg/dL — ABNORMAL HIGH (ref 70–99)
Phosphorus: 2.8 mg/dL (ref 2.5–4.6)
Potassium: 3.6 mmol/L (ref 3.5–5.1)
Sodium: 137 mmol/L (ref 135–145)

## 2019-04-22 LAB — MAGNESIUM: Magnesium: 1.9 mg/dL (ref 1.7–2.4)

## 2019-04-22 MED ORDER — SODIUM CHLORIDE 0.9 % IV BOLUS
500.0000 mL | Freq: Once | INTRAVENOUS | Status: AC
  Administered 2019-04-22: 500 mL via INTRAVENOUS

## 2019-04-22 MED ORDER — LIDOCAINE-EPINEPHRINE-TETRACAINE (LET) TOPICAL GEL: 3.0000 mL | Freq: Once | TOPICAL | Status: DC

## 2019-04-22 MED ORDER — SODIUM CHLORIDE 0.9 % IV BOLUS
500.0000 mL | Freq: Once | INTRAVENOUS | Status: AC
  Administered 2019-04-22: 05:00:00 500 mL via INTRAVENOUS

## 2019-04-22 MED ORDER — LIDOCAINE-EPINEPHRINE-TETRACAINE (LET) SOLUTION
3.0000 mL | Freq: Once | NASAL | Status: DC
  Filled 2019-04-22: qty 3

## 2019-04-22 MED ORDER — CALCIUM GLUCONATE-NACL 1-0.675 GM/50ML-% IV SOLN
1.0000 g | Freq: Once | INTRAVENOUS | Status: AC
  Administered 2019-04-22: 1000 mg via INTRAVENOUS
  Filled 2019-04-22: qty 50

## 2019-04-22 MED ORDER — MELATONIN 3 MG PO TABS
9.0000 mg | ORAL_TABLET | ORAL | Status: AC
  Administered 2019-04-22: 03:00:00 9 mg via ORAL
  Filled 2019-04-22: qty 3

## 2019-04-22 MED ORDER — NICOTINE 14 MG/24HR TD PT24
14.0000 mg | MEDICATED_PATCH | Freq: Every day | TRANSDERMAL | Status: DC
  Administered 2019-04-22 – 2019-04-23 (×2): 14 mg via TRANSDERMAL
  Filled 2019-04-22 (×2): qty 1

## 2019-04-22 MED ORDER — NON FORMULARY: 9.0000 mg | Freq: Once | Status: DC

## 2019-04-22 NOTE — Progress Notes (Signed)
[facility]   Reason for visit: Follow up on pyelonephritis  Interval History: urine culture growing E coli.  Remains afebrile.  Complaint of too many blood draws.  No associated rash or diarrhea.   Meropenem day 2  Physical Exam: Constitutional:  Vitals:   04/22/19 0700 04/22/19 0800  BP: 98/60 93/66  Pulse: 86 88  Resp: 18 18  Temp:  98.5 F (36.9 C)  SpO2: 96% 94%   patient appears in NAD Eyes: anicteric Respiratory: Normal respiratory effort; CTA B Cardiovascular: RRR GI: soft, nt, nd  Review of Systems: Constitutional: negative for fevers and chills Gastrointestinal: negative for nausea and diarrhea Integument/breast: negative for rash  Lab Results  Component Value Date   WBC 13.4 (H) 04/22/2019   HGB 9.8 (L) 04/22/2019   HCT 31.8 (L) 04/22/2019   MCV 85.7 04/22/2019   PLT 303 04/22/2019    Lab Results  Component Value Date   CREATININE 0.92 04/22/2019   CREATININE 0.95 04/22/2019   BUN 14 04/22/2019   BUN 14 04/22/2019   NA 139 04/22/2019   NA 137 04/22/2019   K 3.6 04/22/2019   K 3.6 04/22/2019   CL 109 04/22/2019   CL 107 04/22/2019   CO2 21 (L) 04/22/2019   CO2 20 (L) 04/22/2019    Lab Results  Component Value Date   ALT 8 04/20/2019   AST 10 (L) 04/20/2019   ALKPHOS 83 04/20/2019     Microbiology: Recent Results (from the past 240 hour(s))  Urine culture     Status: Abnormal (Preliminary result)   Collection Time: 04/20/19  1:18 PM   Specimen: Urine, Clean Catch  Result Value Ref Range Status   Specimen Description   Final    URINE, CLEAN CATCH Performed at [facility], [address] [place], [place] [address]    Special Requests   Final    NONE Performed at [facility], [address] [place], [place] [address]    Culture (A)  Final    50,000 COLONIES/mL ESCHERICHIA COLI SUSCEPTIBILITIES TO FOLLOW CULTURE REINCUBATED FOR BETTER GROWTH Performed at [facility], [address],  [place], [place] [address]    Report Status PENDING  Incomplete  SARS Coronavirus 2 by RT PCR (hospital order, performed in [facility] hospital lab) Nasopharyngeal Nasopharyngeal Swab     Status: None   Collection Time: 04/20/19  4:38 PM   Specimen: Nasopharyngeal Swab  Result Value Ref Range Status   SARS Coronavirus 2 NEGATIVE NEGATIVE Final    Comment: (NOTE) If result is NEGATIVE SARS-CoV-2 target nucleic acids are NOT DETECTED. The SARS-CoV-2 RNA is generally detectable in upper and lower  respiratory specimens during the acute phase of infection. The lowest  concentration of SARS-CoV-2 viral copies this assay can detect is 250  copies / mL. A negative result does not preclude SARS-CoV-2 infection  and should not be used as the sole basis for treatment or other  patient management decisions.  A negative result may occur with  improper specimen collection / handling, submission of specimen other  than nasopharyngeal swab, presence of viral mutation(s) within the  areas targeted by this assay, and inadequate number of viral copies  (<250 copies / mL). A negative result must be combined with clinical  observations, patient history, and epidemiological information. If result is POSITIVE SARS-CoV-2 target nucleic acids are DETECTED. The SARS-CoV-2 RNA is generally detectable in upper and lower  respiratory specimens dur ing the  acute phase of infection.  Positive  results are indicative of active infection with SARS-CoV-2.  Clinical  correlation with patient history and other diagnostic information is  necessary to determine patient infection status.  Positive results do  not rule out bacterial infection or co-infection with other viruses. If result is PRESUMPTIVE POSTIVE SARS-CoV-2 nucleic acids MAY BE PRESENT.   A presumptive positive result was obtained on the submitted specimen  and confirmed on repeat testing.  While 2019 novel coronavirus  (SARS-CoV-2) nucleic acids may be present  in the submitted sample  additional confirmatory testing may be necessary for epidemiological  and / or clinical management purposes  to differentiate between  SARS-CoV-2 and other Sarbecovirus currently known to infect humans.  If clinically indicated additional testing with an alternate test  methodology [id number]) is advised. The SARS-CoV-2 RNA is generally  detectable in upper and lower respiratory sp ecimens during the acute  phase of infection. The expected result is Negative. Fact Sheet for Patients:  BoilerBrush.com.cyhttps://www.fda.gov/media/136312/download Fact Sheet for Healthcare Providers: https://pope.com/https://www.fda.gov/media/136313/download This test is not yet approved or cleared by the [place] FDA and has been authorized for detection and/or diagnosis of SARS-CoV-2 by FDA under an Emergency Use Authorization (EUA).  This EUA will remain in effect (meaning this test can be used) for the duration of the COVID-19 declaration under Section 564(b)(1) of the Act, 21 U.S.C. section 360bbb-3(b)(1), unless the authorization is terminated or revoked sooner. Performed at [facility], [address] [place], [place] [address]   Body fluid culture     Status: None (Preliminary result)   Collection Time: 04/21/19 12:01 AM   Specimen: Urine, Random  Result Value Ref Range Status   Specimen Description   Final    URINE, RANDOM Performed at [facility], [address] [place], [place] [address]    Special Requests   Final    RIGHT KIDNEY Performed at [facility], [address] [place], [place] [address]    Gram Stain   Final    FEW WBC PRESENT, PREDOMINANTLY PMN ABUNDANT GRAM NEGATIVE RODS ABUNDANT GRAM POSITIVE COCCI IN PAIRS    Culture   Final    ABUNDANT ESCHERICHIA COLI SUSCEPTIBILITIES TO FOLLOW Performed at [facility], [address] [place], [place] [address]    Report Status PENDING  Incomplete  Culture, blood (routine x 2)     Status:  None (Preliminary result)   Collection Time: 04/21/19  3:39 PM   Specimen: BLOOD  Result Value Ref Range Status   Specimen Description   Final    BLOOD LEFT ARM Performed at [facility], [address] [place], [place] [address]    Special Requests   Final    BOTTLES DRAWN AEROBIC ONLY Blood Culture adequate volume Performed at [facility], [address] [place], [place] [address]    Culture   Final    NO GROWTH < 12 HOURS Performed at [facility], [address] [place], [place] [address]    Report Status PENDING  Incomplete  Culture, blood (routine x 2)     Status: None (Preliminary result)   Collection Time: 04/21/19  3:45 PM   Specimen: BLOOD LEFT HAND  Result Value Ref Range Status   Specimen Description   Final    BLOOD LEFT HAND Performed at [facility], [address] [place], [place] [address]    Special Requests   Final  BOTTLES DRAWN AEROBIC ONLY Blood Culture adequate volume Performed at [place] [facility] [address], [place], [place] [address]    Culture   Final    NO GROWTH < 12 HOURS Performed at [facility] [address], [place], [place] [address]    Report Status PENDING  Incomplete  MRSA PCR Screening     Status: None   Collection Time: 04/21/19  6:25 PM   Specimen: Nasopharyngeal  Result Value Ref Range Status   MRSA by PCR NEGATIVE NEGATIVE Final    Comment:        The GeneXpert MRSA Assay (FDA approved for NASAL specimens only), is one component of a comprehensive MRSA colonization surveillance program. It is not intended to diagnose MRSA infection nor to guide or monitor treatment for MRSA infections. 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  On meropenem.  Will change to appropriate oral therapy once sensitivities are  revealed. With the nephrostomy tubes, I would treat for 10 days at discharge.   2.  Leukocytosis - WBC 13.4, stable.  Will continue to monitor.   3. Fever - from #1 and she has remained afebrile > 24 hours.  4. Hydronephrosis - with nephrostomy tubes placed by IR.

## 2019-04-22 NOTE — Progress Notes (Signed)
CRITICAL VALUE ALERT  Critical Value:  LA 2.1  Date & Time Notied:  10/11 @ 1940  Provider Notified: TRH on call  Orders Received/Actions taken: awaiting orders

## 2019-04-22 NOTE — Progress Notes (Signed)
PROGRESS NOTE    Pamela Horn  CVE:[id number] DOB: 1980/11/06 DOA: 04/20/2019 PCP: Lemmie Evens, MD    Brief Narrative:  38 year old female with history of opiate use disorder on Suboxone admitted to the hospital with severe right flank pain.  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  Patient underwent emergent right-sided percutaneous nephrostomy tube placement by interventional radiology.  Postprocedure, she had drop in blood pressure needed IV fluid resuscitation and admitted to stepdown unit.  Followed by urology and infectious disease.   Assessment & Plan:   Principal Problem:   Pyelonephritis Active Problems:   Smoker   Hydronephrosis with renal and ureteral calculus obstruction   Opioid use disorder (HCC)  Severe sepsis secondary to emphysematous pyelonephritis, obstructive hydronephrosis due to infected stone: Clinically stabilizing.  Blood pressure is responding. Blood cultures negative so far.  Urine culture with gram-negative rods and gram-positive cocci.  Previously treated with broad-spectrum antibiotics, currently remains on meropenem. Nephrostomy functioning well, urology will probably do a second stage procedure once patient is medically stabilized. Adequate pain medications. Continue to monitor at the stepdown unit today.  Continue maintenance IV fluids.  Intake and output monitoring. Recheck WBC and renal function test in the morning. Chest physiotherapy, incentive spirometry and mobility today.  Opiate use disorder on Suboxone: Continue Suboxone.  Advised to use less and less opiates.  Smoker: Counseled to quit.  On nicotine patch.   DVT prophylaxis: SCDs Code Status: Full code Family Communication: None Disposition Plan: Home after clinical stabilization.   Consultants:   Urology  Infectious disease  Procedures:   Percutaneous nephrostomy tube, right side, 04/21/2019  Antimicrobials:   Meropenem,  04/20/2019--   Subjective: Patient seen and examined.  Afebrile overnight.  Pain on the right nephrostomy site, not taking deep breath.  Objective: Vitals:   04/22/19 0600 04/22/19 0630 04/22/19 0700 04/22/19 0800  BP: (!) 84/54 100/61 98/60 93/66   Pulse: 91 90 86 88  Resp: (!) 21 16 18 18   Temp:    98.5 F (36.9 C)  TempSrc:    Oral  SpO2: 96% 97% 96% 94%  Weight:      Height:        Intake/Output Summary (Last 24 hours) at 04/22/2019 1058 Last data filed at 04/22/2019 0819 Gross per 24 hour  Intake 4086.92 ml  Output 625 ml  Net 3461.92 ml   Filed Weights   04/20/19 1304 04/22/19 0414  Weight: 78 kg 86.8 kg    Examination:  General exam: Appears calm and comfortable, on room air. Respiratory system: Clear to auscultation. Respiratory effort normal.  No added sounds. Cardiovascular system: S1 & S2 heard, RRR. No JVD, murmurs, rubs, gallops or clicks. No pedal edema. Gastrointestinal system: Abdomen is nondistended, soft and nontender. No organomegaly or masses felt. Normal bowel sounds heard. Right posterior nephrostomy site with nephrostomy tube present, flowing freely. Foley catheter with clear urine. Central nervous system: Alert and oriented. No focal neurological deficits. Extremities: Symmetric 5 x 5 power. Skin: No rashes, lesions or ulcers Psychiatry: Judgement and insight appear normal. Mood & affect appropriate.     Data Reviewed: I have personally reviewed following labs and imaging studies  CBC: Recent Labs  Lab 04/20/19 1351 04/21/19 1018 04/22/19 0220  WBC 12.8* 14.9* 13.4*  NEUTROABS 10.1*  --  11.8*  HGB 11.6* 10.1* 9.8*  HCT 37.5 33.3* 31.8*  MCV 83.5 86.0 85.7  PLT 374 320 025   Basic Metabolic Panel: Recent Labs  Lab 04/20/19  1351 04/21/19 1018 04/21/19 1112 04/22/19 0220  NA 136 138 139 137   139  K 3.9 3.4* 3.4* 3.6   3.6  CL 101 105 105 107   109  CO2 27 20* 18* 20*   21*  GLUCOSE 119* 66* 58* 120*   124*  BUN 6 12 11 14    14   CREATININE 0.87 1.03* 1.14* 0.95   0.92  CALCIUM 8.4* 7.6* 7.6* 7.1*   7.1*  MG  --   --  1.7 1.9  PHOS  --   --  3.5 2.8   GFR: Estimated Creatinine Clearance: 84.9 mL/min (by C-G formula based on SCr of 0.92 mg/dL). Liver Function Tests: Recent Labs  Lab 04/20/19 1351 04/21/19 1112 04/22/19 0220  AST 10*  --   --   ALT 8  --   --   ALKPHOS 83  --   --   BILITOT 0.2*  --   --   PROT 8.2*  --   --   ALBUMIN 3.1* 2.6* 2.2*   No results for input(s): LIPASE, AMYLASE in the last 168 hours. No results for input(s): AMMONIA in the last 168 hours. Coagulation Profile: Recent Labs  Lab 04/20/19 1833  INR 1.3*   Cardiac Enzymes: No results for input(s): CKTOTAL, CKMB, CKMBINDEX, TROPONINI in the last 168 hours. BNP (last 3 results) No results for input(s): PROBNP in the last 8760 hours. HbA1C: No results for input(s): HGBA1C in the last 72 hours. CBG: Recent Labs  Lab 04/21/19 1636  GLUCAP 184*   Lipid Profile: No results for input(s): CHOL, HDL, LDLCALC, TRIG, CHOLHDL, LDLDIRECT in the last 72 hours. Thyroid Function Tests: No results for input(s): TSH, T4TOTAL, FREET4, T3FREE, THYROIDAB in the last 72 hours. Anemia Panel: No results for input(s): VITAMINB12, FOLATE, FERRITIN, TIBC, IRON, RETICCTPCT in the last 72 hours. Sepsis Labs: Recent Labs  Lab 04/21/19 1542 04/21/19 1835  LATICACIDVEN 1.6 2.7*    Recent Results (from the past 240 hour(s))  Urine culture     Status: Abnormal (Preliminary result)   Collection Time: 04/20/19  1:18 PM   Specimen: Urine, Clean Catch  Result Value Ref Range Status   Specimen Description   Final    URINE, CLEAN CATCH Performed at [facility], [address], [place], [place]    Special Requests   Final    NONE Performed at [facility], [address] [place], [place]    Culture (A)  Final    50,000 COLONIES/mL ESCHERICHIA COLI SUSCEPTIBILITIES TO FOLLOW CULTURE REINCUBATED FOR BETTER  GROWTH Performed at [facility], [address] [place], [place]    Report Status PENDING  Incomplete  SARS Coronavirus 2 by RT PCR (hospital order, performed in [facility] hospital lab) Nasopharyngeal Nasopharyngeal Swab     Status: None   Collection Time: 04/20/19  4:38 PM   Specimen: Nasopharyngeal Swab  Result Value Ref Range Status   SARS Coronavirus 2 NEGATIVE NEGATIVE Final    Comment: (NOTE) If result is NEGATIVE SARS-CoV-2 target nucleic acids are NOT DETECTED. The SARS-CoV-2 RNA is generally detectable in upper and lower  respiratory specimens during the acute phase of infection. The lowest  concentration of SARS-CoV-2 viral copies this assay can detect is 250  copies / mL. A negative result does not preclude SARS-CoV-2 infection  and should not be used as the sole basis for treatment or other  patient management decisions.  A negative result may  occur with  improper specimen collection / handling, submission of specimen other  than nasopharyngeal swab, presence of viral mutation(s) within the  areas targeted by this assay, and inadequate number of viral copies  (<250 copies / mL). A negative result must be combined with clinical  observations, patient history, and epidemiological information. If result is POSITIVE SARS-CoV-2 target nucleic acids are DETECTED. The SARS-CoV-2 RNA is generally detectable in upper and lower  respiratory specimens dur ing the acute phase of infection.  Positive  results are indicative of active infection with SARS-CoV-2.  Clinical  correlation with patient history and other diagnostic information is  necessary to determine patient infection status.  Positive results do  not rule out bacterial infection or co-infection with other viruses. If result is PRESUMPTIVE POSTIVE SARS-CoV-2 nucleic acids MAY BE PRESENT.   A presumptive positive result was obtained on the submitted specimen  and confirmed on repeat testing.  While  2019 novel coronavirus  (SARS-CoV-2) nucleic acids may be present in the submitted sample  additional confirmatory testing may be necessary for epidemiological  and / or clinical management purposes  to differentiate between  SARS-CoV-2 and other Sarbecovirus currently known to infect humans.  If clinically indicated additional testing with an alternate test  methodology [id number]) is advised. The SARS-CoV-2 RNA is generally  detectable in upper and lower respiratory sp ecimens during the acute  phase of infection. The expected result is Negative. Fact Sheet for Patients:  BoilerBrush.com.cy Fact Sheet for Healthcare Providers: https://pope.com/ This test is not yet approved or cleared by the [place] FDA and has been authorized for detection and/or diagnosis of SARS-CoV-2 by FDA under an Emergency Use Authorization (EUA).  This EUA will remain in effect (meaning this test can be used) for the duration of the COVID-19 declaration under Section 564(b)(1) of the Act, 21 U.S.C. section 360bbb-3(b)(1), unless the authorization is terminated or revoked sooner. Performed at [facility], [address] [place], [place] [address]   Body fluid culture     Status: None (Preliminary result)   Collection Time: 04/21/19 12:01 AM   Specimen: Urine, Random  Result Value Ref Range Status   Specimen Description   Final    URINE, RANDOM Performed at [facility], [address], [place], [place] [address]    Special Requests   Final    RIGHT KIDNEY Performed at [facility], [address], [place], [place] [address]    Gram Stain   Final    FEW WBC PRESENT, PREDOMINANTLY PMN ABUNDANT GRAM NEGATIVE RODS ABUNDANT GRAM POSITIVE COCCI IN PAIRS Performed at [facility], [address] [place], [place] [address]    Culture ABUNDANT GRAM NEGATIVE RODS  Final   Report Status PENDING  Incomplete   Culture, blood (routine x 2)     Status: None (Preliminary result)   Collection Time: 04/21/19  3:39 PM   Specimen: BLOOD  Result Value Ref Range Status   Specimen Description   Final    BLOOD LEFT ARM Performed at [facility], [address], [place], [place] [address]    Special Requests   Final    BOTTLES DRAWN AEROBIC ONLY Blood Culture adequate volume Performed at [facility], [address] [place], [place] [address]    Culture   Final    NO GROWTH < 12 HOURS Performed at [facility], [address] [place], [place] [address]    Report Status  PENDING  Incomplete  Culture, blood (routine x 2)     Status: None (Preliminary result)   Collection Time: 04/21/19  3:45 PM   Specimen: BLOOD LEFT HAND  Result Value Ref Range Status   Specimen Description   Final    BLOOD LEFT HAND Performed at [facility], [address], [place], [place] [address]    Special Requests   Final    BOTTLES DRAWN AEROBIC ONLY Blood Culture adequate volume Performed at [facility], [address] [place], [place] [address]    Culture   Final    NO GROWTH < 12 HOURS Performed at [facility], [address], [place], [place] [address]    Report Status PENDING  Incomplete  MRSA PCR Screening     Status: None   Collection Time: 04/21/19  6:25 PM   Specimen: Nasopharyngeal  Result Value Ref Range Status   MRSA by PCR NEGATIVE NEGATIVE Final    Comment:        The GeneXpert MRSA Assay (FDA approved for NASAL specimens only), is one component of a comprehensive MRSA colonization surveillance program. It is not intended to diagnose MRSA infection nor to guide or monitor treatment for MRSA infections. Performed at [facility], [address] [place], [place] [address]          Radiology Studies: Ct Renal Stone Study  Result Date: 04/20/2019 CLINICAL DATA:  Right-sided  flank pain for 1 day. EXAM: CT ABDOMEN AND PELVIS WITHOUT CONTRAST TECHNIQUE: Multidetector CT imaging of the abdomen and pelvis was performed following the standard protocol without IV contrast. COMPARISON:  September 06, 2008 FINDINGS: Lower chest: No acute abnormality. Hepatobiliary: No focal liver abnormality is seen. No gallstones, gallbladder wall thickening, or biliary dilatation. Pancreas: Unremarkable. No pancreatic ductal dilatation or surrounding inflammatory changes. Spleen: Normal in size without focal abnormality. Adrenals/Urinary Tract: Normal adrenal glands. Normal appearance of the left kidney, left ureter and urinary bladder. Markedly abnormal appearance of the right kidney. Numerous large right renal calculi, including a 12 mm calculus within the right renal pelvis at the UPJ. Abnormal hypoattenuated appearance of the right kidney likely represents severe hydronephrosis with thinning of the right renal cortex. Scattered foci of gas within the right renal parenchyma/collecting system are noted. Marked perinephric inflammatory changes. The right ureter is dilated with the proximal ureter measuring 18 mm. There is an obstructive 5.7 mm calculus in the distal right ureter, just shy of the vesicoureteral junction. Periureteral inflammatory changes also present. Stomach/Bowel: Stomach is within normal limits. No evidence of appendicitis. No evidence of bowel wall thickening, distention, or inflammatory changes. Vascular/Lymphatic: Abnormal enlarged and rounded right retroperitoneal lymph nodes measure up to 19 mm in short axis. Reproductive: Uterus and bilateral adnexa are unremarkable. Other: No abdominal wall hernia or abnormality. No abdominopelvic ascites. Musculoskeletal: No acute osseous findings. Bilateral sacroiliitis. IMPRESSION: 1. 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 2. Numerous large right renal calculi, including a 12 mm probably obstructive calculus within the right renal pelvis at the UPJ. 5.7 mm obstructive calculus in the distal right ureter, just shy of the vesicoureteral junction. 3. Abnormal enlarged and rounded right retroperitoneal lymph nodes measure up to 19 mm in short axis, presumably reactive. Follow-up may be considered after resolution of patient's acute symptomatology to assure resolution. These results were  called by telephone at the time of interpretation on 04/20/2019 at 4:13 pm to provider Trisha MangleKaren Sophia, who verbally acknowledged these results. Electronically Signed   By: Ted Mcalpineobrinka  Dimitrova M.D.   On: 04/20/2019 16:23   Ir Nephrostomy Placement Right  Result Date: 04/21/2019 CLINICAL DATA:  Right nephrolithiasis, suspected pyonephrosis, sepsis EXAM: RIGHT PERCUTANEOUS NEPHROSTOMY CATHETER PLACEMENT UNDER ULTRASOUND AND FLUOROSCOPIC GUIDANCE FLUOROSCOPY TIME:  0.5 minute; 110 uGym2 DAP TECHNIQUE: The procedure, risks (including but not limited to bleeding, infection, organ damage ), benefits, and alternatives were explained to the patient. Questions regarding the procedure were encouraged and answered. The patient understands and consents to the procedure. Rightflank region prepped with chlorhexidine, draped in usual sterile fashion, infiltrated locally with 1% lidocaine. The patient was already receiving adequate prophylactic antibiotic coverage. Intravenous Fentanyl 100mcg and Versed 2mg  were administered as conscious sedation during continuous monitoring of the patient's level of consciousness and physiological / cardiorespiratory status by the radiology RN, with a total moderate sedation time of 13 minutes. Under real-time ultrasound guidance, a 21-gauge trocar needle was advanced into a posterior lower pole calyx. Ultrasound image documentation was  saved. Urine spontaneously returned through the needle. Needle was exchanged over a guidewire for transitional dilator. Contrast injection confirmed appropriate positioning. Catheter was exchanged over a guidewire for a 10 French pigtail catheter, formed centrally within the right renal collecting system. Contrast injection confirms appropriate positioning and patency. Multiple radiodense renal calculi noted. Catheter secured externally with 0 Prolene suture and placed to external drain bag. COMPLICATIONS: COMPLICATIONS none IMPRESSION: 1. Technically successful right percutaneous nephrostomy catheter placement. Electronically Signed   By: Corlis Leak  Hassell M.D.   On: 04/21/2019 07:45        Scheduled Meds:  buprenorphine-naloxone  1 tablet Sublingual BID   Chlorhexidine Gluconate Cloth  6 each Topical Daily   nicotine  14 mg Transdermal Daily   sodium chloride flush  5 mL Intracatheter Q8H   Continuous Infusions:  sodium chloride Stopped (04/22/19 0243)   meropenem (MERREM) IV Stopped (04/22/19 [id number])     LOS: 2 days    Time spent: 30 minutes    Dorcas CarrowKuber Chenel Wernli, MD [facility] Pager [phone]  If 7PM-7AM, please contact night-coverage [email or web address] Password [email or web address] 04/22/2019, 10:58 AM

## 2019-04-22 NOTE — Progress Notes (Signed)
Subjective: Patient remains hypotensive, but afebrile.  She reports right-sided flank discomfort with twisting and bending, but states that it is manageable.  She denies nausea/vomiting or Foley catheter discomfort.  WBC and renal function improving.  Urine cultures are growing E. coli with sensitivities pending.  Blood cultures are negative.  Objective: Vital signs in last 24 hours: Temp:  [97.9 F (36.6 C)-98.8 F (37.1 C)] 97.9 F (36.6 C) (10/12 1200) Pulse Rate:  [86-109] 88 (10/12 0800) Resp:  [16-35] 18 (10/12 0800) BP: (78-114)/(48-75) 93/66 (10/12 0800) SpO2:  [92 %-100 %] 94 % (10/12 0800) Weight:  [86.8 kg] 86.8 kg (10/12 0414)  Intake/Output from previous day: 10/11 0701 - 10/12 0700 In: 3730.2 [P.O.:960; I.V.:2020; IV Piggyback:745.2] Out: 1100 [Urine:825; Drains:275]  Intake/Output this shift: Total I/O In: 701.7 [Other:5; IV Piggyback:696.7] Out: -   Physical Exam:  General: Alert and oriented CV: RRR, palpable distal pulses Lungs: CTAB, equal chest rise Abdomen: Soft, NTND, no rebound or guarding GU: Right nephrostomy tube in place and draining purulent fluid.  Foley catheter in place and draining amber urine. Ext: NT, No erythema  Lab Results: Recent Labs    04/20/19 1351 04/21/19 1018 04/22/19 0220  HGB 11.6* 10.1* 9.8*  HCT 37.5 33.3* 31.8*   BMET Recent Labs    04/21/19 1112 04/22/19 0220  NA 139 137  139  K 3.4* 3.6  3.6  CL 105 107  109  CO2 18* 20*  21*  GLUCOSE 58* 120*  124*  BUN 11 14  14   CREATININE 1.14* 0.95  0.92  CALCIUM 7.6* 7.1*  7.1*     Studies/Results: Ir Nephrostomy Placement Right  Result Date: 04/21/2019 CLINICAL DATA:  Right nephrolithiasis, suspected pyonephrosis, sepsis EXAM: RIGHT PERCUTANEOUS NEPHROSTOMY CATHETER PLACEMENT UNDER ULTRASOUND AND FLUOROSCOPIC GUIDANCE FLUOROSCOPY TIME:  0.5 minute; 110 uGym2 DAP TECHNIQUE: The procedure, risks (including but not limited to bleeding, infection, organ  damage ), benefits, and alternatives were explained to the patient. Questions regarding the procedure were encouraged and answered. The patient understands and consents to the procedure. Rightflank region prepped with chlorhexidine, draped in usual sterile fashion, infiltrated locally with 1% lidocaine. The patient was already receiving adequate prophylactic antibiotic coverage. Intravenous Fentanyl 06/21/2019 and Versed 2mg  were administered as conscious sedation during continuous monitoring of the patient's level of consciousness and physiological / cardiorespiratory status by the radiology RN, with a total moderate sedation time of 13 minutes. Under real-time ultrasound guidance, a 21-gauge trocar needle was advanced into a posterior lower pole calyx. Ultrasound image documentation was saved. Urine spontaneously returned through the needle. Needle was exchanged over a guidewire for transitional dilator. Contrast injection confirmed appropriate positioning. Catheter was exchanged over a guidewire for a 10 French pigtail catheter, formed centrally within the right renal collecting system. Contrast injection confirms appropriate positioning and patency. Multiple radiodense renal calculi noted. Catheter secured externally with 0 Prolene suture and placed to external drain bag. COMPLICATIONS: COMPLICATIONS none IMPRESSION: 1. Technically successful right percutaneous nephrostomy catheter placement. Electronically Signed   By: M.D.   On: 04/21/2019 07:45    Assessment/Plan: 38 year old female with right emphysematous pyelitis from proximal and distal ureteral calculi.  Status post right PCN on 04/20/2019.  -Clinically improving but remains hypotensive.  ID is managing antibiotic coverage.  I agree with a total of 10 days of therapy -Tentatively plan for cystoscopy with right ureteroscopy to address her stone burden approximately 2 weeks. -Keep Foley catheter in place -Will continue to monitor  LOS: 2  days   Ellison Hughs, MD [facility] Pager: [phone]  04/22/2019, 5:18 PM

## 2019-04-22 NOTE — Progress Notes (Signed)
Patient ID: Pamela Horn, female   DOB: [birth year], 38 y.o.   MRN: [id number]    Referring Physician(s): Melton KrebsSnyder, E  Supervising Physician: Simonne ComeWatts, John  Patient Status:  [facility]  Chief Complaint:  Pyelonephritis, hydronephrosis   Subjective: Patient reports some tenderness over right lateral abdomen. She denies fever, vomiting, and has good PO.   Allergies: Bactrim [sulfamethoxazole-trimethoprim]  Medications: Prior to Admission medications   Medication Sig Start Date End Date Taking? Authorizing Provider  Buprenorphine HCl-Naloxone HCl 8-2 MG FILM Place 0.25 Film under the tongue 2 (two) times daily. 1/4 FILM under the tongue twice daily 03/27/18  Yes [provider]  phenazopyridine (AZO-STANDARD) 95 MG tablet Take 95-190 mg by mouth daily as needed for pain.   Yes [provider]     Vital Signs: BP 93/66 (BP Location: Left Arm)    Pulse 88    Temp 98.5 F (36.9 C) (Oral)    Resp 18    Ht 5\' 3"  (1.6 m)    Wt 191 lb 5.8 oz (86.8 kg)    LMP 04/03/2019 Comment: neg preg   SpO2 94%    BMI 33.90 kg/m   Physical Exam  Patient standing upon entrance. Right posterior nephrosotmy tube with 25cc serosanguinous fluid. Bandages changed. Incision clean and dry. Minimal tenderness to palpation on abdomen and back.  Imaging: Ct Renal Stone Study  Result Date: 04/20/2019 CLINICAL DATA:  Right-sided flank pain for 1 day. EXAM: CT ABDOMEN AND PELVIS WITHOUT CONTRAST TECHNIQUE: Multidetector CT imaging of the abdomen and pelvis was performed following the standard protocol without IV contrast. COMPARISON:  September 06, 2008 FINDINGS: Lower chest: No acute abnormality. Hepatobiliary: No focal liver abnormality is seen. No gallstones, gallbladder wall thickening, or biliary dilatation. Pancreas: Unremarkable. No pancreatic ductal dilatation or surrounding inflammatory changes. Spleen: Normal in size without focal abnormality. Adrenals/Urinary Tract: Normal adrenal glands.  Normal appearance of the left kidney, left ureter and urinary bladder. Markedly abnormal appearance of the right kidney. Numerous large right renal calculi, including a 12 mm calculus within the right renal pelvis at the UPJ. Abnormal hypoattenuated appearance of the right kidney likely represents severe hydronephrosis with thinning of the right renal cortex. Scattered foci of gas within the right renal parenchyma/collecting system are noted. Marked perinephric inflammatory changes. The right ureter is dilated with the proximal ureter measuring 18 mm. There is an obstructive 5.7 mm calculus in the distal right ureter, just shy of the vesicoureteral junction. Periureteral inflammatory changes also present. Stomach/Bowel: Stomach is within normal limits. No evidence of appendicitis. No evidence of bowel wall thickening, distention, or inflammatory changes. Vascular/Lymphatic: Abnormal enlarged and rounded right retroperitoneal lymph nodes measure up to 19 mm in short axis. Reproductive: Uterus and bilateral adnexa are unremarkable. Other: No abdominal wall hernia or abnormality. No abdominopelvic ascites. Musculoskeletal: No acute osseous findings. Bilateral sacroiliitis. IMPRESSION: 1. 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 2. Numerous large right renal calculi, including a 12 mm probably obstructive calculus within the right renal pelvis at the UPJ. 5.7 mm obstructive calculus in the distal right ureter, just shy of the vesicoureteral junction. 3. Abnormal enlarged and rounded right retroperitoneal lymph nodes measure up to 19 mm in short axis, presumably reactive. Follow-up may be considered after resolution of patient's acute symptomatology to assure resolution. These results were called by  telephone at the  time of interpretation on 04/20/2019 at 4:13 pm to provider Trisha Mangle, who verbally acknowledged these results. Electronically Signed   By: Ted Mcalpine M.D.   On: 04/20/2019 16:23   Ir Nephrostomy Placement Right  Result Date: 04/21/2019 CLINICAL DATA:  Right nephrolithiasis, suspected pyonephrosis, sepsis EXAM: RIGHT PERCUTANEOUS NEPHROSTOMY CATHETER PLACEMENT UNDER ULTRASOUND AND FLUOROSCOPIC GUIDANCE FLUOROSCOPY TIME:  0.5 minute; 110 uGym2 DAP TECHNIQUE: The procedure, risks (including but not limited to bleeding, infection, organ damage ), benefits, and alternatives were explained to the patient. Questions regarding the procedure were encouraged and answered. The patient understands and consents to the procedure. Rightflank region prepped with chlorhexidine, draped in usual sterile fashion, infiltrated locally with 1% lidocaine. The patient was already receiving adequate prophylactic antibiotic coverage. Intravenous Fentanyl and Versed 2mg  were administered as conscious sedation during continuous monitoring of the patient's level of consciousness and physiological / cardiorespiratory status by the radiology RN, with a total moderate sedation time of 13 minutes. Under real-time ultrasound guidance, a 21-gauge trocar needle was advanced into a posterior lower pole calyx. Ultrasound image documentation was saved. Urine spontaneously returned through the needle. Needle was exchanged over a guidewire for transitional dilator. Contrast injection confirmed appropriate positioning. Catheter was exchanged over a guidewire for a 10 French pigtail catheter, formed centrally within the right renal collecting system. Contrast injection confirms appropriate positioning and patency. Multiple radiodense renal calculi noted. Catheter secured externally with 0 Prolene suture and placed to external drain bag. COMPLICATIONS: COMPLICATIONS none IMPRESSION: 1. Technically successful right  percutaneous nephrostomy catheter placement. Electronically Signed   By: M.D.   On: 04/21/2019 07:45    Labs:  CBC: Recent Labs    04/20/19 1351 04/21/19 1018 04/22/19 0220  WBC 12.8* 14.9* 13.4*  HGB 11.6* 10.1* 9.8*  HCT 37.5 33.3* 31.8*  PLT 374 320 303    COAGS: Recent Labs    04/20/19 1833  INR 1.3*  APTT 38*    BMP: Recent Labs    04/20/19 1351 04/21/19 1018 04/21/19 1112 04/22/19 0220  NA 136 138 139 137   139  K 3.9 3.4* 3.4* 3.6   3.6  CL 101 105 105 107   109  CO2 27 20* 18* 20*   21*  GLUCOSE 119* 66* 58* 120*   124*  BUN 6 12 11 14   14   CALCIUM 8.4* 7.6* 7.6* 7.1*   7.1*  CREATININE 0.87 1.03* 1.14* 0.95   0.92  GFRNONAA >60 >60 >60 >60   >60  GFRAA >60 >60 >60 >60   >60    LIVER FUNCTION TESTS: Recent Labs    04/20/19 1351 04/21/19 1112 04/22/19 0220  BILITOT 0.2*  --   --   AST 10*  --   --   ALT 8  --   --   ALKPHOS 83  --   --   PROT 8.2*  --   --   ALBUMIN 3.1* 2.6* 2.2*    Assessment and Plan: Right PCN placement 10/11 due to 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 UC GNR/GPC- e coli to date, BC negative. Creat nl; WBC 13.4(14.9); Continue broad spec abx. NT monitoring output. Patient stated she will take the drain home and return for CT in 2 weeks. Further plans as per urology  Electronically Signed: D. 06/21/19, PA-C/Chelsea Cyr PAS-S 04/22/2019, 12:25 PM   I spent a total of 15 Minutes at the the patient's bedside AND on the patient's hospital floor  or unit, greater than 50% of which was counseling/coordinating care for right nephrostomy

## 2019-04-23 LAB — URINE CULTURE: Culture: 50000 — AB

## 2019-04-23 LAB — BASIC METABOLIC PANEL
Anion gap: 8 (ref 5–15)
BUN: 11 mg/dL (ref 6–20)
CO2: 21 mmol/L — ABNORMAL LOW (ref 22–32)
Calcium: 7.5 mg/dL — ABNORMAL LOW (ref 8.9–10.3)
Chloride: 111 mmol/L (ref 98–111)
Creatinine, Ser: 0.71 mg/dL (ref 0.44–1.00)
GFR calc Af Amer: >60 mL/min (ref >60)
GFR calc non Af Amer: >60 mL/min (ref >60)
Glucose, Bld: 116 mg/dL — ABNORMAL HIGH (ref 70–99)
Potassium: 3.7 mmol/L (ref 3.5–5.1)
Sodium: 140 mmol/L (ref 135–145)

## 2019-04-23 LAB — CBC
HCT: 28.3 % — ABNORMAL LOW (ref 36.0–46.0)
Hemoglobin: 8.6 g/dL — ABNORMAL LOW (ref 12.0–15.0)
MCH: 26.5 pg (ref 26.0–34.0)
MCHC: 30.4 g/dL (ref 30.0–36.0)
MCV: 87.1 fL (ref 80.0–100.0)
Platelets: 305 10*3/uL (ref 150–400)
RBC: 3.25 MIL/uL — ABNORMAL LOW (ref 3.87–5.11)
RDW: 13.3 % (ref 11.5–15.5)
WBC: 11.7 10*3/uL — ABNORMAL HIGH (ref 4.0–10.5)
nRBC: 0 % (ref 0.0–0.2)

## 2019-04-23 MED ORDER — CEFAZOLIN SODIUM-DEXTROSE 2-4 GM/100ML-% IV SOLN
2.0000 g | Freq: Three times a day (TID) | INTRAVENOUS | Status: DC
  Administered 2019-04-23 – 2019-04-24 (×3): 2 g via INTRAVENOUS
  Filled 2019-04-23 (×6): qty 100

## 2019-04-23 NOTE — Progress Notes (Signed)
[facility]   Reason for visit: Follow up on pyelonephritis  Interval History: no new complaints, no associated rash or diarrhea.  Culture noted and mainly sensitive.  No new complaints.  Meropenem day 3  Physical Exam: Constitutional:  Vitals:   04/23/19 0900 04/23/19 1000  BP: 111/72 102/70  Pulse: 85 93  Resp: 14 (!) 22  Temp:    SpO2: 100% 99%   patient appears in NAD Eyes: anicteric Respiratory: Normal respiratory effort; CTA B Cardiovascular: RRR GI: soft, nt, nd  Review of Systems: Constitutional: negative for fevers and chills Integument/breast: negative for rash  Lab Results  Component Value Date   WBC 11.7 (H) 04/23/2019   HGB 8.6 (L) 04/23/2019   HCT 28.3 (L) 04/23/2019   MCV 87.1 04/23/2019   PLT 305 04/23/2019    Lab Results  Component Value Date   CREATININE 0.71 04/23/2019   BUN 11 04/23/2019   NA 140 04/23/2019   K 3.7 04/23/2019   CL 111 04/23/2019   CO2 21 (L) 04/23/2019    Lab Results  Component Value Date   ALT 8 04/20/2019   AST 10 (L) 04/20/2019   ALKPHOS 83 04/20/2019     Microbiology: Recent Results (from the past 240 hour(s))  Urine culture     Status: Abnormal   Collection Time: 04/20/19  1:18 PM   Specimen: Urine, Clean Catch  Result Value Ref Range Status   Specimen Description   Final    URINE, CLEAN CATCH Performed at [facility], [address] [place], [place] [address]    Special Requests   Final    NONE Performed at [facility], [address] [place], [place] [address]    Culture 50,000 COLONIES/mL ESCHERICHIA COLI (A)  Final   Report Status 04/23/2019 FINAL  Final   Organism ID, Bacteria ESCHERICHIA COLI (A)  Final      Susceptibility   Escherichia coli - MIC*    AMPICILLIN 16 INTERMEDIATE Intermediate     CEFAZOLIN <=4 SENSITIVE Sensitive     CEFTRIAXONE <=1 SENSITIVE Sensitive     CIPROFLOXACIN <=0.25 SENSITIVE Sensitive     GENTAMICIN <=1 SENSITIVE Sensitive     IMIPENEM  <=0.25 SENSITIVE Sensitive     NITROFURANTOIN 32 SENSITIVE Sensitive     TRIMETH/SULFA >=320 RESISTANT Resistant     AMPICILLIN/SULBACTAM 4 SENSITIVE Sensitive     PIP/TAZO <=4 SENSITIVE Sensitive     Extended ESBL NEGATIVE Sensitive     * 50,000 COLONIES/mL ESCHERICHIA COLI  SARS Coronavirus 2 by RT PCR (hospital order, performed in [facility] lab) Nasopharyngeal Nasopharyngeal Swab     Status: None   Collection Time: 04/20/19  4:38 PM   Specimen: Nasopharyngeal Swab  Result Value Ref Range Status   SARS Coronavirus 2 NEGATIVE NEGATIVE Final    Comment: (NOTE) If result is NEGATIVE SARS-CoV-2 target nucleic acids are NOT DETECTED. The SARS-CoV-2 RNA is generally detectable in upper and lower  respiratory specimens during the acute phase of infection. The lowest  concentration of SARS-CoV-2 viral copies this assay can detect is 250  copies / mL. A negative result does not preclude SARS-CoV-2 infection  and should not be used as the sole basis for treatment or other  patient management decisions.  A negative result may occur with  improper specimen collection / handling, submission of specimen other  than nasopharyngeal swab, presence of viral mutation(s) within the  areas targeted by this assay, and inadequate number  of viral copies  (<250 copies / mL). A negative result must be combined with clinical  observations, patient history, and epidemiological information. If result is POSITIVE SARS-CoV-2 target nucleic acids are DETECTED. The SARS-CoV-2 RNA is generally detectable in upper and lower  respiratory specimens dur ing the acute phase of infection.  Positive  results are indicative of active infection with SARS-CoV-2.  Clinical  correlation with patient history and other diagnostic information is  necessary to determine patient infection status.  Positive results do  not rule out bacterial infection or co-infection with other viruses. If result is PRESUMPTIVE POSTIVE  SARS-CoV-2 nucleic acids MAY BE PRESENT.   A presumptive positive result was obtained on the submitted specimen  and confirmed on repeat testing.  While 2019 novel coronavirus  (SARS-CoV-2) nucleic acids may be present in the submitted sample  additional confirmatory testing may be necessary for epidemiological  and / or clinical management purposes  to differentiate between  SARS-CoV-2 and other Sarbecovirus currently known to infect humans.  If clinically indicated additional testing with an alternate test  methodology [phone]) is advised. The SARS-CoV-2 RNA is generally  detectable in upper and lower respiratory sp ecimens during the acute  phase of infection. The expected result is Negative. Fact Sheet for Patients:  BoilerBrush.com.cy Fact Sheet for Healthcare Providers: https://pope.com/ This test is not yet approved or cleared by the [place] FDA and has been authorized for detection and/or diagnosis of SARS-CoV-2 by FDA under an Emergency Use Authorization (EUA).  This EUA will remain in effect (meaning this test can be used) for the duration of the COVID-19 declaration under Section 564(b)(1) of the Act, 21 U.S.C. section 360bbb-3(b)(1), unless the authorization is terminated or revoked sooner. Performed at [facility], [address] [place], [place] [address]   Body fluid culture     Status: None (Preliminary result)   Collection Time: 04/21/19 12:01 AM   Specimen: Urine, Random  Result Value Ref Range Status   Specimen Description   Final    URINE, RANDOM Performed at [facility], [address] [place], [place] [address]    Special Requests   Final    RIGHT KIDNEY Performed at [facility], [address] [place], [place] [address]    Gram Stain   Final    FEW WBC PRESENT, PREDOMINANTLY PMN ABUNDANT GRAM NEGATIVE RODS ABUNDANT GRAM POSITIVE COCCI IN PAIRS    Culture    Final    ABUNDANT ESCHERICHIA COLI HOLDING FOR POSSIBLE ANAEROBE Performed at [facility], [address] [place], [place] [address]    Report Status PENDING  Incomplete   Organism ID, Bacteria ESCHERICHIA COLI  Final      Susceptibility   Escherichia coli - MIC*    AMPICILLIN 16 INTERMEDIATE Intermediate     CEFAZOLIN <=4 SENSITIVE Sensitive     CEFTRIAXONE <=1 SENSITIVE Sensitive     CIPROFLOXACIN <=0.25 SENSITIVE Sensitive     GENTAMICIN <=1 SENSITIVE Sensitive     IMIPENEM <=0.25 SENSITIVE Sensitive     NITROFURANTOIN 32 SENSITIVE Sensitive     TRIMETH/SULFA >=320 RESISTANT Resistant     AMPICILLIN/SULBACTAM 4 SENSITIVE Sensitive     PIP/TAZO <=4 SENSITIVE Sensitive     Extended ESBL NEGATIVE Sensitive     * ABUNDANT ESCHERICHIA COLI  Culture, blood (routine x 2)     Status: None (Preliminary result)   Collection Time: 04/21/19  3:39 PM   Specimen: BLOOD  Result Value Ref  Range Status   Specimen Description   Final    BLOOD LEFT ARM Performed at [facility] [address] [place], [place] [address]    Special Requests   Final    BOTTLES DRAWN AEROBIC ONLY Blood Culture adequate volume Performed at [facility] [address], [place], [place] [address]    Culture   Final    NO GROWTH < 12 HOURS Performed at [facility] [address] [place], [place] [address]    Report Status PENDING  Incomplete  Culture, blood (routine x 2)     Status: None (Preliminary result)   Collection Time: 04/21/19  3:45 PM   Specimen: BLOOD LEFT HAND  Result Value Ref Range Status   Specimen Description   Final    BLOOD LEFT HAND Performed at [facility] [address], [place], [place] [address]    Special Requests   Final    BOTTLES DRAWN AEROBIC ONLY Blood Culture adequate volume Performed at [facility] [address], [place], [place] [address]    Culture   Final    NO GROWTH < 12  HOURS Performed at [facility] [address] [place], [place] [address]    Report Status PENDING  Incomplete  MRSA PCR Screening     Status: None   Collection Time: 04/21/19  6:25 PM   Specimen: Nasopharyngeal  Result Value Ref Range Status   MRSA by PCR NEGATIVE NEGATIVE Final    Comment:        The GeneXpert MRSA Assay (FDA approved for NASAL specimens only), is one component of a comprehensive MRSA colonization surveillance program. It is not intended to diagnose MRSA infection nor to guide or monitor treatment for MRSA infections. 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  Will use cefazolin and can take Keflex 500 mg qid for 10 days at discharge. Following up with urology.  2.  Leukocyotisis - down to 11.7.  From #1.   3. Hydronephrosis - urostomy tubes in place and plan to have them removed in about 2 weeks.    I will sign off, call with any questions.

## 2019-04-23 NOTE — Progress Notes (Signed)
  Subjective: No complaints this afternoon.  Afebrile over the past 24 hours and blood pressure is improving.  Appropriate output from right nephrostomy tube and Foley catheter.  Urine culture grew E. coli that is resistant to Bactrim and ampicillin.  Objective: Vital signs in last 24 hours: Temp:  [97.5 F (36.4 C)-99 F (37.2 C)] 97.8 F (36.6 C) (10/13 1321) Pulse Rate:  [78-93] 84 (10/13 1321) Resp:  [14-28] 18 (10/13 1321) BP: (90-116)/(50-82) 113/82 (10/13 1321) SpO2:  [93 %-100 %] 100 % (10/13 1321)  Intake/Output from previous day: 10/12 0701 - 10/13 0700 In: 721.7 [I.V.:20; IV Piggyback:696.7] Out: 7494 [Urine:1450; Drains:200]  Intake/Output this shift: Total I/O In: 5 [I.V.:5] Out: -   Physical Exam:  General: Alert and oriented CV: RRR, palpable distal pulses Lungs: CTAB, equal chest rise Abdomen: Soft, NTND, no rebound or guarding GU: Right nephrostomy tube in place and draining amber urine.  Foley catheter in place and draining amber urine Ext: NT, No erythema  Lab Results: Recent Labs    04/21/19 1018 04/22/19 0220 04/23/19 0205  HGB 10.1* 9.8* 8.6*  HCT 33.3* 31.8* 28.3*   BMET Recent Labs    04/22/19 0220 04/23/19 0205  NA 137  139 140  K 3.6  3.6 3.7  CL 107  109 111  CO2 20*  21* 21*  GLUCOSE 120*  124* 116*  BUN 14  14 11   CREATININE 0.95  0.92 0.71  CALCIUM 7.1*  7.1* 7.5*     Studies/Results: No results found.  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    E. coli UTI.  Status post right PCN on 04/20/2019.  -DC Foley catheter -Keep nephrostomy tube to drainage -Agree with p.o. Keflex -Will arrange an office visit in 1 week for a preop checkup.  Plan for right ureteroscopy and treatment of her obstructing stones in approximately 2 to 3 weeks. -Please call back with any questions or concerns.   LOS: 3 days   Ellison Hughs, MD [facility] Pager: [phone]  04/23/2019, 2:50 PM

## 2019-04-23 NOTE — Progress Notes (Signed)
Report given to Maudie Mercury, RN and all questions answered. All belongings sent with patient.

## 2019-04-23 NOTE — Progress Notes (Signed)
PROGRESS NOTE    Pamela Horn  AVW:[id number] DOB: May 29, 1981 DOA: 04/20/2019 PCP: Gareth Morgan, MD    Brief Narrative:  38 year old female with history of opiate use disorder on Suboxone admitted to the hospital with severe right flank pain.  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  Patient underwent emergent right-sided percutaneous nephrostomy tube placement by interventional radiology.  Postprocedure, she had drop in blood pressure needed IV fluid resuscitation and admitted to stepdown unit.  Followed by urology and infectious disease.   Assessment & Plan:   Principal Problem:   Pyelonephritis Active Problems:   Smoker   Hydronephrosis with renal and ureteral calculus obstruction   Opioid use disorder (HCC)  Severe sepsis secondary to emphysematous pyelonephritis, obstructive hydronephrosis due to infected stone: Clinically stabilizing.  Blood pressure improved.  Blood cultures negative so far. Urine cultures with pan sensitive E Coli. Previously treated with broad-spectrum antibiotics, currently on ancef. Nephrostomy functioning well, urology will probably do a second stage procedure once patient is medically stabilized. Adequate pain medications. Transfer to MedSurg bed. Advance activities, incentive spirometry and deep breathing exercises. Foley in place, urology following. Nursing to teach nephrostomy bag emptying techniques and care.  Opiate use disorder on Suboxone: Continue Suboxone.  She will avoid opiates.  Will use Toradol.  Renal functions normal.  Smoker: Counseled to quit.  On nicotine patch.   DVT prophylaxis: SCDs Code Status: Full code Family Communication: None Disposition Plan: Home after clinical stabilization.  Anticipate tomorrow.   Consultants:   Urology  Infectious disease  Procedures:   Percutaneous nephrostomy tube, right side, 04/21/2019  Antimicrobials:   Meropenem,  04/20/2019--04/22/2019  Ancef, 04/22/2019---   Subjective:  Seen and examined.  Afebrile overnight.  Right flank pain persist and severe on ambulation or deep breathing.  Objective: Vitals:   04/23/19 1100 04/23/19 1200 04/23/19 1300 04/23/19 1321  BP: 102/60 97/66 116/80 113/82  Pulse: 89 86 88 84  Resp: (!) 23 20 (!) 24 18  Temp:    97.8 F (36.6 C)  TempSrc:    Oral  SpO2: 93% 99% 100% 100%  Weight:      Height:        Intake/Output Summary (Last 24 hours) at 04/23/2019 1429 Last data filed at 04/23/2019 1215 Gross per 24 hour  Intake 125 ml  Output 1650 ml  Net -1525 ml   Filed Weights   04/20/19 1304 04/22/19 0414  Weight: 78 kg 86.8 kg    Examination:  General exam: Appears calm and comfortable, on room air.  Slightly anxious. Respiratory system: Clear to auscultation. Respiratory effort normal.  No added sounds. Cardiovascular system: S1 & S2 heard, RRR. No JVD, murmurs, rubs, gallops or clicks. No pedal edema. Gastrointestinal system: Abdomen is nondistended, soft and nontender. No organomegaly or masses felt. Normal bowel sounds heard. Right posterior nephrostomy site with nephrostomy tube present, flowing freely dark urine. Foley catheter with clear urine. Central nervous system: Alert and oriented. No focal neurological deficits. Extremities: Symmetric 5 x 5 power. Skin: No rashes, lesions or ulcers Psychiatry: Judgement and insight appear normal. Mood & affect appropriate.     Data Reviewed: I have personally reviewed following labs and imaging studies  CBC: Recent Labs  Lab 04/20/19 1351 04/21/19 1018 04/22/19 0220 04/23/19 0205  WBC 12.8* 14.9* 13.4* 11.7*  NEUTROABS 10.1*  --  11.8*  --   HGB 11.6* 10.1* 9.8* 8.6*  HCT 37.5 33.3* 31.8* 28.3*  MCV 83.5 86.0 85.7 87.1  PLT 374 320 303 161   Basic Metabolic Panel: Recent Labs  Lab 04/20/19 1351 04/21/19 1018 04/21/19 1112 04/22/19 0220 04/23/19 0205  NA 136 138 139 137  139 140   K 3.9 3.4* 3.4* 3.6  3.6 3.7  CL 101 105 105 107  109 111  CO2 27 20* 18* 20*  21* 21*  GLUCOSE 119* 66* 58* 120*  124* 116*  BUN 6 12 11 14  14 11   CREATININE 0.87 1.03* 1.14* 0.95  0.92 0.71  CALCIUM 8.4* 7.6* 7.6* 7.1*  7.1* 7.5*  MG  --   --  1.7 1.9  --   PHOS  --   --  3.5 2.8  --    GFR: Estimated Creatinine Clearance: 97.7 mL/min (by C-G formula based on SCr of 0.71 mg/dL). Liver Function Tests: Recent Labs  Lab 04/20/19 1351 04/21/19 1112 04/22/19 0220  AST 10*  --   --   ALT 8  --   --   ALKPHOS 83  --   --   BILITOT 0.2*  --   --   PROT 8.2*  --   --   ALBUMIN 3.1* 2.6* 2.2*   No results for input(s): LIPASE, AMYLASE in the last 168 hours. No results for input(s): AMMONIA in the last 168 hours. Coagulation Profile: Recent Labs  Lab 04/20/19 1833  INR 1.3*   Cardiac Enzymes: No results for input(s): CKTOTAL, CKMB, CKMBINDEX, TROPONINI in the last 168 hours. BNP (last 3 results) No results for input(s): PROBNP in the last 8760 hours. HbA1C: No results for input(s): HGBA1C in the last 72 hours. CBG: Recent Labs  Lab 04/21/19 1636  GLUCAP 184*   Lipid Profile: No results for input(s): CHOL, HDL, LDLCALC, TRIG, CHOLHDL, LDLDIRECT in the last 72 hours. Thyroid Function Tests: No results for input(s): TSH, T4TOTAL, FREET4, T3FREE, THYROIDAB in the last 72 hours. Anemia Panel: No results for input(s): VITAMINB12, FOLATE, FERRITIN, TIBC, IRON, RETICCTPCT in the last 72 hours. Sepsis Labs: Recent Labs  Lab 04/21/19 1542 04/21/19 1835  LATICACIDVEN 1.6 2.7*    Recent Results (from the past 240 hour(s))  Urine culture     Status: Abnormal   Collection Time: 04/20/19  1:18 PM   Specimen: Urine, Clean Catch  Result Value Ref Range Status   Specimen Description   Final    URINE, CLEAN CATCH Performed at [facility], [address] [place], [place] [address]    Special Requests   Final    NONE Performed at [facility], [address]  [place], [place] [address]    Culture 50,000 COLONIES/mL ESCHERICHIA COLI (A)  Final   Report Status 04/23/2019 FINAL  Final   Organism ID, Bacteria ESCHERICHIA COLI (A)  Final      Susceptibility   Escherichia coli - MIC*    AMPICILLIN 16 INTERMEDIATE Intermediate     CEFAZOLIN <=4 SENSITIVE Sensitive     CEFTRIAXONE <=1 SENSITIVE Sensitive     CIPROFLOXACIN <=0.25 SENSITIVE Sensitive     GENTAMICIN <=1 SENSITIVE Sensitive     IMIPENEM <=0.25 SENSITIVE Sensitive     NITROFURANTOIN 32 SENSITIVE Sensitive     TRIMETH/SULFA >=320 RESISTANT Resistant     AMPICILLIN/SULBACTAM 4 SENSITIVE Sensitive     PIP/TAZO <=4 SENSITIVE Sensitive     Extended ESBL NEGATIVE Sensitive     * 50,000 COLONIES/mL ESCHERICHIA COLI  SARS Coronavirus 2 by RT PCR (hospital order, performed in [facility] hospital lab) Nasopharyngeal Nasopharyngeal Swab  Status: None   Collection Time: 04/20/19  4:38 PM   Specimen: Nasopharyngeal Swab  Result Value Ref Range Status   SARS Coronavirus 2 NEGATIVE NEGATIVE Final    Comment: (NOTE) If result is NEGATIVE SARS-CoV-2 target nucleic acids are NOT DETECTED. The SARS-CoV-2 RNA is generally detectable in upper and lower  respiratory specimens during the acute phase of infection. The lowest  concentration of SARS-CoV-2 viral copies this assay can detect is 250  copies / mL. A negative result does not preclude SARS-CoV-2 infection  and should not be used as the sole basis for treatment or other  patient management decisions.  A negative result may occur with  improper specimen collection / handling, submission of specimen other  than nasopharyngeal swab, presence of viral mutation(s) within the  areas targeted by this assay, and inadequate number of viral copies  (<250 copies / mL). A negative result must be combined with clinical  observations, patient history, and epidemiological information. If result is POSITIVE SARS-CoV-2 target nucleic acids are DETECTED. The  SARS-CoV-2 RNA is generally detectable in upper and lower  respiratory specimens dur ing the acute phase of infection.  Positive  results are indicative of active infection with SARS-CoV-2.  Clinical  correlation with patient history and other diagnostic information is  necessary to determine patient infection status.  Positive results do  not rule out bacterial infection or co-infection with other viruses. If result is PRESUMPTIVE POSTIVE SARS-CoV-2 nucleic acids MAY BE PRESENT.   A presumptive positive result was obtained on the submitted specimen  and confirmed on repeat testing.  While 2019 novel coronavirus  (SARS-CoV-2) nucleic acids may be present in the submitted sample  additional confirmatory testing may be necessary for epidemiological  and / or clinical management purposes  to differentiate between  SARS-CoV-2 and other Sarbecovirus currently known to infect humans.  If clinically indicated additional testing with an alternate test  methodology [phone]) is advised. The SARS-CoV-2 RNA is generally  detectable in upper and lower respiratory sp ecimens during the acute  phase of infection. The expected result is Negative. Fact Sheet for Patients:  BoilerBrush.com.cy Fact Sheet for Healthcare Providers: https://pope.com/ This test is not yet approved or cleared by the [place] FDA and has been authorized for detection and/or diagnosis of SARS-CoV-2 by FDA under an Emergency Use Authorization (EUA).  This EUA will remain in effect (meaning this test can be used) for the duration of the COVID-19 declaration under Section 564(b)(1) of the Act, 21 U.S.C. section 360bbb-3(b)(1), unless the authorization is terminated or revoked sooner. Performed at [facility], [address], [place], [place] [address]   Body fluid culture     Status: None (Preliminary result)   Collection Time: 04/21/19 12:01 AM   Specimen: Urine, Random   Result Value Ref Range Status   Specimen Description   Final    URINE, RANDOM Performed at [facility], [address], [place], [place] [address]    Special Requests   Final    RIGHT KIDNEY Performed at [facility], [address] [place], [place] [address]    Gram Stain   Final    FEW WBC PRESENT, PREDOMINANTLY PMN ABUNDANT GRAM NEGATIVE RODS ABUNDANT GRAM POSITIVE COCCI IN PAIRS    Culture   Final    ABUNDANT ESCHERICHIA COLI HOLDING FOR POSSIBLE ANAEROBE Performed at [facility], [address] [place], [place] [address]    Report Status PENDING  Incomplete  Organism ID, Bacteria ESCHERICHIA COLI  Final      Susceptibility   Escherichia coli - MIC*    AMPICILLIN 16 INTERMEDIATE Intermediate     CEFAZOLIN <=4 SENSITIVE Sensitive     CEFTRIAXONE <=1 SENSITIVE Sensitive     CIPROFLOXACIN <=0.25 SENSITIVE Sensitive     GENTAMICIN <=1 SENSITIVE Sensitive     IMIPENEM <=0.25 SENSITIVE Sensitive     NITROFURANTOIN 32 SENSITIVE Sensitive     TRIMETH/SULFA >=320 RESISTANT Resistant     AMPICILLIN/SULBACTAM 4 SENSITIVE Sensitive     PIP/TAZO <=4 SENSITIVE Sensitive     Extended ESBL NEGATIVE Sensitive     * ABUNDANT ESCHERICHIA COLI  Culture, blood (routine x 2)     Status: None (Preliminary result)   Collection Time: 04/21/19  3:39 PM   Specimen: BLOOD  Result Value Ref Range Status   Specimen Description   Final    BLOOD LEFT ARM Performed at [facility], [address] [place], [place] [address]    Special Requests   Final    BOTTLES DRAWN AEROBIC ONLY Blood Culture adequate volume Performed at [facility], [address] [place], [place] [address]    Culture   Final    NO GROWTH < 12 HOURS Performed at [facility], [address] [place], [place] [address]    Report Status PENDING  Incomplete  Culture, blood (routine x 2)     Status: None (Preliminary result)    Collection Time: 04/21/19  3:45 PM   Specimen: BLOOD LEFT HAND  Result Value Ref Range Status   Specimen Description   Final    BLOOD LEFT HAND Performed at [facility], [address] [place], [place] [address]    Special Requests   Final    BOTTLES DRAWN AEROBIC ONLY Blood Culture adequate volume Performed at [facility], [address] [place], [place] [address]    Culture   Final    NO GROWTH < 12 HOURS Performed at [facility], [address] [place], [place] [address]    Report Status PENDING  Incomplete  MRSA PCR Screening     Status: None   Collection Time: 04/21/19  6:25 PM   Specimen: Nasopharyngeal  Result Value Ref Range Status   MRSA by PCR NEGATIVE NEGATIVE Final    Comment:        The GeneXpert MRSA Assay (FDA approved for NASAL specimens only), is one component of a comprehensive MRSA colonization surveillance program. It is not intended to diagnose MRSA infection nor to guide or monitor treatment for MRSA infections. Performed at [facility], [address] [place], [place] [address]          Radiology Studies: No results found.      Scheduled Meds: . buprenorphine-naloxone  1 tablet Sublingual BID  . Chlorhexidine Gluconate Cloth  6 each Topical Daily  . lidocaine-EPINEPHrine-tetracaine  3 mL Topical Once  . nicotine  14 mg Transdermal Daily  . sodium chloride flush  5 mL Intracatheter Q8H   Continuous Infusions: .  ceFAZolin (ANCEF) IV Stopped (04/23/19 1243)     LOS: 3 days    Time spent: 25 minutes    Dorcas CarrowKuber Mayo Owczarzak, MD [facility] Pager [phone]  If 7PM-7AM, please contact night-coverage [email or web address] Password [email or web address] 04/23/2019, 2:29 PM

## 2019-04-24 LAB — BASIC METABOLIC PANEL
Anion gap: 13 (ref 5–15)
BUN: 8 mg/dL (ref 6–20)
CO2: 20 mmol/L — ABNORMAL LOW (ref 22–32)
Calcium: 8.3 mg/dL — ABNORMAL LOW (ref 8.9–10.3)
Chloride: 111 mmol/L (ref 98–111)
Creatinine, Ser: 0.83 mg/dL (ref 0.44–1.00)
GFR calc Af Amer: >60 mL/min (ref >60)
GFR calc non Af Amer: >60 mL/min (ref >60)
Glucose, Bld: 92 mg/dL (ref 70–99)
Potassium: 4.6 mmol/L (ref 3.5–5.1)
Sodium: 144 mmol/L (ref 135–145)

## 2019-04-24 LAB — BODY FLUID CULTURE

## 2019-04-24 LAB — CBC
HCT: 29.8 % — ABNORMAL LOW (ref 36.0–46.0)
Hemoglobin: 9.8 g/dL — ABNORMAL LOW (ref 12.0–15.0)
MCH: 26.3 pg (ref 26.0–34.0)
MCHC: 32.9 g/dL (ref 30.0–36.0)
MCV: 79.9 fL — ABNORMAL LOW (ref 80.0–100.0)
Platelets: 390 10*3/uL (ref 150–400)
RBC: 3.73 MIL/uL — ABNORMAL LOW (ref 3.87–5.11)
RDW: 13.6 % (ref 11.5–15.5)
WBC: 13.3 10*3/uL — ABNORMAL HIGH (ref 4.0–10.5)
nRBC: 0.2 % (ref 0.0–0.2)

## 2019-04-24 MED ORDER — CEPHALEXIN 500 MG PO CAPS: 500.0000 mg | ORAL_CAPSULE | Freq: Three times a day (TID) | ORAL | 0 refills | Status: AC

## 2019-04-24 NOTE — Progress Notes (Signed)
Pt discharged home with significant other in stable condition. Discharge instructions given. Drain teaching, drain care, and dressing care provided to patient and family. No immediate questions or concerns at this time. Med sent to pharmacy of choice. Pt opted to ambulate off unit.

## 2019-04-24 NOTE — Discharge Summary (Signed)
Physician Discharge Summary  Pamela Horn ZOX:[id number] DOB: 1981-04-06 DOA: 04/20/2019  PCP: Gareth Morgan, MD  Admit date: 04/20/2019 Discharge date: 04/24/2019  Admitted From: Home Disposition: Home  Recommendations for Outpatient Follow-up:  1. Follow up with PCP in 1-2 weeks 2. 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  Underwent emergent right-sided percutaneous nephrostomy tube placement by interventional radiology.  Treated with aggressive IV fluids and IV antibiotics.  Patient was found to have multiple right-sided obstructive stone.  Patient did adequate clinical improvement.  Urine culture with pansensitive E. coli, blood cultures negative, nephrostomy tube draining well, renal function is normal.  Going home today with 10 more days of oral Keflex, nephrostomy tube with precautions and care instructions, follow-up with urology for cystoscopy and retrograde urogram in 1 to 2 weeks.  Office will call for appointment.  Discharge Diagnoses:  Principal Problem:   Pyelonephritis Active Problems:   Smoker   Hydronephrosis with renal and ureteral calculus obstruction   Opioid use disorder [facility])    Discharge Instructions  Discharge Instructions    Call MD for:  redness, tenderness, or signs of infection (pain, swelling, redness, odor or green/yellow discharge around incision site)   Complete by: As directed    Call MD for:  severe uncontrolled pain   Complete by: As directed    Call MD for:  temperature >100.4   Complete by: As directed    Diet general   Complete by: As directed    Discharge instructions   Complete  by: As directed    Care of nephrostomy bag as instructed .   Increase activity slowly   Complete by: As directed      Allergies as of 04/24/2019      Reactions   Bactrim [sulfamethoxazole-trimethoprim]    Redness, itching, burning, blotchy feet and hands      Medication List    TAKE these medications   Azo-Standard 95 MG tablet Generic drug: phenazopyridine Take 95-190 mg by mouth daily as needed for pain.   Buprenorphine HCl-Naloxone HCl 8-2 MG Film Place 0.25 Film under the tongue 2 (two) times daily. 1/4 FILM under the tongue twice daily   cephALEXin 500 MG capsule Commonly known as: KEFLEX Take 1 capsule (500 mg total) by mouth 3 (three) times daily for 10 days.      Follow-up Information    Rene Paci, MD In 1 week.   Specialty: Urology Contact information: [address] [place] [address] [phone]          Allergies  Allergen Reactions  . Bactrim [Sulfamethoxazole-Trimethoprim]     Redness, itching, burning, blotchy feet and hands    Consultations:  Urology  Infectious disease  Interventional radiology   Procedures/Studies: Ct Renal Stone Study  Result Date: 04/20/2019 CLINICAL DATA:  Right-sided flank pain for 1 day. EXAM: CT ABDOMEN AND PELVIS WITHOUT CONTRAST TECHNIQUE: Multidetector CT imaging of the abdomen and pelvis was performed following the standard protocol without IV contrast. COMPARISON:  September 06, 2008 FINDINGS: Lower chest: No acute abnormality. Hepatobiliary: No focal liver abnormality is seen. No gallstones, gallbladder wall thickening, or biliary dilatation. Pancreas: Unremarkable. No pancreatic ductal dilatation or surrounding inflammatory changes. Spleen: Normal  in size without focal abnormality. Adrenals/Urinary Tract: Normal adrenal glands. Normal appearance of the left kidney, left ureter and urinary bladder. Markedly abnormal appearance of the right kidney. Numerous large right renal calculi,  including a 12 mm calculus within the right renal pelvis at the UPJ. Abnormal hypoattenuated appearance of the right kidney likely represents severe hydronephrosis with thinning of the right renal cortex. Scattered foci of gas within the right renal parenchyma/collecting system are noted. Marked perinephric inflammatory changes. The right ureter is dilated with the proximal ureter measuring 18 mm. There is an obstructive 5.7 mm calculus in the distal right ureter, just shy of the vesicoureteral junction. Periureteral inflammatory changes also present. Stomach/Bowel: Stomach is within normal limits. No evidence of appendicitis. No evidence of bowel wall thickening, distention, or inflammatory changes. Vascular/Lymphatic: Abnormal enlarged and rounded right retroperitoneal lymph nodes measure up to 19 mm in short axis. Reproductive: Uterus and bilateral adnexa are unremarkable. Other: No abdominal wall hernia or abnormality. No abdominopelvic ascites. Musculoskeletal: No acute osseous findings. Bilateral sacroiliitis. IMPRESSION: 1. 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 2. Numerous large right renal calculi, including a 12 mm probably obstructive calculus within the right renal pelvis at the UPJ. 5.7 mm obstructive calculus in the distal right ureter, just shy of the vesicoureteral junction. 3. Abnormal enlarged and rounded right retroperitoneal lymph nodes measure up to 19 mm in short axis, presumably reactive. Follow-up may be considered after resolution of patient's acute symptomatology to assure resolution. These results were called by telephone at the time of interpretation on 04/20/2019 at 4:13 pm to provider Trisha Mangle, who verbally acknowledged these results. Electronically Signed   By:  Ted Mcalpine M.D.   On: 04/20/2019 16:23   Ir Nephrostomy Placement Right  Result Date: 04/21/2019 CLINICAL DATA:  Right nephrolithiasis, suspected pyonephrosis, sepsis EXAM: RIGHT PERCUTANEOUS NEPHROSTOMY CATHETER PLACEMENT UNDER ULTRASOUND AND FLUOROSCOPIC GUIDANCE FLUOROSCOPY TIME:  0.5 minute; 110 uGym2 DAP TECHNIQUE: The procedure, risks (including but not limited to bleeding, infection, organ damage ), benefits, and alternatives were explained to the patient. Questions regarding the procedure were encouraged and answered. The patient understands and consents to the procedure. Rightflank region prepped with chlorhexidine, draped in usual sterile fashion, infiltrated locally with 1% lidocaine. The patient was already receiving adequate prophylactic antibiotic coverage. Intravenous Fentanyl and Versed 2mg  were administered as conscious sedation during continuous monitoring of the patient's level of consciousness and physiological / cardiorespiratory status by the radiology RN, with a total moderate sedation time of 13 minutes. Under real-time ultrasound guidance, a 21-gauge trocar needle was advanced into a posterior lower pole calyx. Ultrasound image documentation was saved. Urine spontaneously returned through the needle. Needle was exchanged over a guidewire for transitional dilator. Contrast injection confirmed appropriate positioning. Catheter was exchanged over a guidewire for a 10 French pigtail catheter, formed centrally within the right renal collecting system. Contrast injection confirms appropriate positioning and patency. Multiple radiodense renal calculi noted. Catheter secured externally with 0 Prolene suture and placed to external drain bag. COMPLICATIONS: COMPLICATIONS none IMPRESSION: 1. Technically successful right percutaneous nephrostomy catheter placement. Electronically Signed   By: M.D.   On: 04/21/2019 07:45     Subjective: Patient seen and examined.  No  overnight events.  Afebrile overnight.  Some pain on tube insertion site, however no other issues.   Instructions and catheter care  discussed.  Patient understands.   Discharge Exam: Vitals:   04/24/19 0613 04/24/19 0818  BP: (!) 130/94 (!) 130/97  Pulse: 92 89  Resp: 18 18  Temp: 99 F (37.2 C) 98.7 F (37.1 C)  SpO2: 93% 96%   Vitals:   04/23/19 1321 04/23/19 2216 04/24/19 0613 04/24/19 0818  BP: 113/82 116/82 (!) 130/94 (!) 130/97  Pulse: 84 82 92 89  Resp: Temp: 97.8 F (36.6 C) 98.1 F (36.7 C) 99 F (37.2 C) 98.7 F (37.1 C)  TempSrc: Oral Oral Oral Oral  SpO2: 100% 97% 93% 96%  Weight:      Height:        General: Pt is alert, awake, not in acute distress Cardiovascular: RRR, S1/S2 +, no rubs, no gallops Respiratory: CTA bilaterally, no wheezing, no rhonchi Abdominal: Soft, NT, ND, bowel sounds +, nephrostomy right side with dark urine and sediment.  Flowing freely. Extremities: no edema, no cyanosis    The results of significant diagnostics from this hospitalization (including imaging, microbiology, ancillary and laboratory) are listed below for reference.     Microbiology: Recent Results (from the past 240 hour(s))  Urine culture     Status: Abnormal   Collection Time: 04/20/19  1:18 PM   Specimen: Urine, Clean Catch  Result Value Ref Range Status   Specimen Description   Final    URINE, CLEAN CATCH Performed at [facility], [address] [place], [place] [address]    Special Requests   Final    NONE Performed at [facility], [address], [place], [place] [address]    Culture 50,000 COLONIES/mL ESCHERICHIA COLI (A)  Final   Report Status 04/23/2019 FINAL  Final   Organism ID, Bacteria ESCHERICHIA COLI (A)  Final      Susceptibility   Escherichia coli - MIC*    AMPICILLIN 16 INTERMEDIATE Intermediate     CEFAZOLIN <=4 SENSITIVE Sensitive     CEFTRIAXONE <=1 SENSITIVE Sensitive     CIPROFLOXACIN <=0.25 SENSITIVE Sensitive      GENTAMICIN <=1 SENSITIVE Sensitive     IMIPENEM <=0.25 SENSITIVE Sensitive     NITROFURANTOIN 32 SENSITIVE Sensitive     TRIMETH/SULFA >=320 RESISTANT Resistant     AMPICILLIN/SULBACTAM 4 SENSITIVE Sensitive     PIP/TAZO <=4 SENSITIVE Sensitive     Extended ESBL NEGATIVE Sensitive     * 50,000 COLONIES/mL ESCHERICHIA COLI  SARS Coronavirus 2 by RT PCR (hospital order, performed in [facility] hospital lab) Nasopharyngeal Nasopharyngeal Swab     Status: None   Collection Time: 04/20/19  4:38 PM   Specimen: Nasopharyngeal Swab  Result Value Ref Range Status   SARS Coronavirus 2 NEGATIVE NEGATIVE Final    Comment: (NOTE) If result is NEGATIVE SARS-CoV-2 target nucleic acids are NOT DETECTED. The SARS-CoV-2 RNA is generally detectable in upper and lower  respiratory specimens during the acute phase of infection. The lowest  concentration of SARS-CoV-2 viral copies this assay can detect is 250  copies / mL. A negative result does not preclude SARS-CoV-2 infection  and should not be used as the sole basis for treatment or other  patient management decisions.  A negative result may occur with  improper specimen collection / handling, submission of specimen other  than nasopharyngeal swab, presence of viral mutation(s) within the  areas targeted by this assay, and inadequate number of viral copies  (<250 copies / mL). A negative result must be combined with clinical  observations,  patient history, and epidemiological information. If result is POSITIVE SARS-CoV-2 target nucleic acids are DETECTED. The SARS-CoV-2 RNA is generally detectable in upper and lower  respiratory specimens dur ing the acute phase of infection.  Positive  results are indicative of active infection with SARS-CoV-2.  Clinical  correlation with patient history and other diagnostic information is  necessary to determine patient infection status.  Positive results do  not rule out bacterial infection or co-infection with  other viruses. If result is PRESUMPTIVE POSTIVE SARS-CoV-2 nucleic acids MAY BE PRESENT.   A presumptive positive result was obtained on the submitted specimen  and confirmed on repeat testing.  While 2019 novel coronavirus  (SARS-CoV-2) nucleic acids may be present in the submitted sample  additional confirmatory testing may be necessary for epidemiological  and / or clinical management purposes  to differentiate between  SARS-CoV-2 and other Sarbecovirus currently known to infect humans.  If clinically indicated additional testing with an alternate test  methodology [phone]) is advised. The SARS-CoV-2 RNA is generally  detectable in upper and lower respiratory sp ecimens during the acute  phase of infection. The expected result is Negative. Fact Sheet for Patients:  BoilerBrush.com.cy Fact Sheet for Healthcare Providers: https://pope.com/ This test is not yet approved or cleared by the [place] FDA and has been authorized for detection and/or diagnosis of SARS-CoV-2 by FDA under an Emergency Use Authorization (EUA).  This EUA will remain in effect (meaning this test can be used) for the duration of the COVID-19 declaration under Section 564(b)(1) of the Act, 21 U.S.C. section 360bbb-3(b)(1), unless the authorization is terminated or revoked sooner. Performed at [facility], [address], [place], [place] [address]   Body fluid culture     Status: None   Collection Time: 04/21/19 12:01 AM   Specimen: Urine, Random  Result Value Ref Range Status   Specimen Description   Final    URINE, RANDOM Performed at [facility], [address], [place], [place] [address]    Special Requests   Final    RIGHT KIDNEY Performed at [facility], [address] [place], [place] [address]    Gram Stain   Final    FEW WBC PRESENT, PREDOMINANTLY PMN ABUNDANT GRAM NEGATIVE RODS ABUNDANT GRAM POSITIVE  COCCI IN PAIRS Performed at [facility], [address], [place], [place] [address]    Culture   Final    ABUNDANT ESCHERICHIA COLI MIXED ANAEROBIC FLORA PRESENT.  CALL LAB IF FURTHER IID REQUIRED.    Report Status 04/24/2019 FINAL  Final   Organism ID, Bacteria ESCHERICHIA COLI  Final      Susceptibility   Escherichia coli - MIC*    AMPICILLIN 16 INTERMEDIATE Intermediate     CEFAZOLIN <=4 SENSITIVE Sensitive     CEFTRIAXONE <=1 SENSITIVE Sensitive     CIPROFLOXACIN <=0.25 SENSITIVE Sensitive     GENTAMICIN <=1 SENSITIVE Sensitive     IMIPENEM <=0.25 SENSITIVE Sensitive     NITROFURANTOIN 32 SENSITIVE Sensitive     TRIMETH/SULFA >=320 RESISTANT Resistant     AMPICILLIN/SULBACTAM 4 SENSITIVE Sensitive     PIP/TAZO <=4 SENSITIVE Sensitive     Extended ESBL NEGATIVE Sensitive     * ABUNDANT ESCHERICHIA COLI  Culture, blood (routine x 2)     Status: None (Preliminary result)   Collection Time: 04/21/19  3:39 PM   Specimen: BLOOD  Result Value Ref Range Status   Specimen Description   Final  BLOOD LEFT ARM Performed at [facility], [address], [place], [place] [address]    Special Requests   Final    BOTTLES DRAWN AEROBIC ONLY Blood Culture adequate volume Performed at [facility], [address], [place], [place] [address]    Culture   Final    NO GROWTH 2 DAYS Performed at [facility], [address] [place], [place] [address]    Report Status PENDING  Incomplete  Culture, blood (routine x 2)     Status: None (Preliminary result)   Collection Time: 04/21/19  3:45 PM   Specimen: BLOOD LEFT HAND  Result Value Ref Range Status   Specimen Description   Final    BLOOD LEFT HAND Performed at [facility], [address] [place], [place] [address]    Special Requests   Final    BOTTLES DRAWN AEROBIC ONLY Blood Culture adequate volume Performed at [facility], [address] [place], [place] [address]    Culture   Final    NO GROWTH 2 DAYS Performed at [facility], [address] [place], [place] [address]    Report Status PENDING  Incomplete  MRSA PCR Screening     Status: None   Collection Time: 04/21/19  6:25 PM   Specimen: Nasopharyngeal  Result Value Ref Range Status   MRSA by PCR NEGATIVE NEGATIVE Final    Comment:        The GeneXpert MRSA Assay (FDA approved for NASAL specimens only), is one component of a comprehensive MRSA colonization surveillance program. It is not intended to diagnose MRSA infection nor to guide or monitor treatment for MRSA infections. Performed at [facility], [address], [place], [place] [address]      Labs: BNP (last 3 results) No results for input(s): BNP in the last 8760 hours. Basic Metabolic Panel: Recent Labs  Lab 04/21/19 1018 04/21/19 1112 04/22/19 0220 04/23/19 0205 04/24/19 0614  NA 138 139 137  139 140 144  K 3.4* 3.4* 3.6  3.6 3.7 4.6  CL 105 105 107  109 111 111  CO2 20* 18* 20*  21* 21* 20*  GLUCOSE 66* 58* 120*  124* 116* 92  BUN CREATININE 1.03* 1.14* 0.95  0.92 0.71 0.83  CALCIUM 7.6* 7.6* 7.1*  7.1* 7.5* 8.3*  MG  --  1.7 1.9  --   --   PHOS  --  3.5 2.8  --   --    Liver Function Tests: Recent Labs  Lab 04/20/19 1351 04/21/19 1112 04/22/19 0220  AST 10*  --   --   ALT 8  --   --   ALKPHOS 83  --   --   BILITOT 0.2*  --   --   PROT 8.2*  --   --   ALBUMIN 3.1* 2.6* 2.2*   No results for input(s): LIPASE, AMYLASE in the last 168 hours. No results for input(s): AMMONIA in the last 168 hours. CBC: Recent Labs  Lab 04/20/19 1351 04/21/19 1018 04/22/19 0220 04/23/19 0205 04/24/19 0614  WBC 12.8* 14.9* 13.4* 11.7* 13.3*  NEUTROABS 10.1*  --  11.8*  --   --   HGB 11.6* 10.1* 9.8* 8.6* 9.8*  HCT 37.5 33.3* 31.8* 28.3* 29.8*  MCV 83.5 86.0 85.7 87.1 79.9*  PLT 374 320 303 305 390   Cardiac Enzymes: No results  for input(s): CKTOTAL, CKMB, CKMBINDEX, TROPONINI in the last 168 hours. BNP: Invalid input(s): POCBNP CBG: Recent Labs  Lab 04/21/19 1636  GLUCAP 184*   D-Dimer No results for input(s): DDIMER in the last 72 hours. Hgb A1c No results for input(s): HGBA1C in the last 72 hours. Lipid Profile No results for input(s): CHOL, HDL, LDLCALC, TRIG, CHOLHDL, LDLDIRECT in the last 72 hours. Thyroid function studies No results for input(s): TSH, T4TOTAL, T3FREE, THYROIDAB in the last 72 hours.  Invalid input(s): FREET3 Anemia work up No results for input(s): VITAMINB12, FOLATE, FERRITIN, TIBC, IRON, RETICCTPCT in the last 72 hours. Urinalysis    Component Value Date/Time   COLORURINE YELLOW 04/20/2019 1318   APPEARANCEUR HAZY (A) 04/20/2019 1318   LABSPEC 1.018 04/20/2019 1318   PHURINE 5.0 04/20/2019 1318   GLUCOSEU NEGATIVE 04/20/2019 1318   HGBUR SMALL (A) 04/20/2019 1318   BILIRUBINUR NEGATIVE 04/20/2019 1318   KETONESUR NEGATIVE 04/20/2019 1318   PROTEINUR 30 (A) 04/20/2019 1318   UROBILINOGEN 0.2 02/19/2015 1500   NITRITE NEGATIVE 04/20/2019 1318   LEUKOCYTESUR MODERATE (A) 04/20/2019 1318   Sepsis Labs Invalid input(s): PROCALCITONIN,  WBC,  LACTICIDVEN Microbiology Recent Results (from the past 240 hour(s))  Urine culture     Status: Abnormal   Collection Time: 04/20/19  1:18 PM   Specimen: Urine, Clean Catch  Result Value Ref Range Status   Specimen Description   Final    URINE, CLEAN CATCH Performed at [facility], [address] [place], [place] [address]    Special Requests   Final    NONE Performed at [facility], [address] [place], [place] [address]    Culture 50,000 COLONIES/mL ESCHERICHIA COLI (A)  Final   Report Status 04/23/2019 FINAL  Final   Organism ID, Bacteria ESCHERICHIA COLI (A)  Final      Susceptibility   Escherichia coli - MIC*    AMPICILLIN 16 INTERMEDIATE Intermediate     CEFAZOLIN <=4 SENSITIVE Sensitive     CEFTRIAXONE <=1  SENSITIVE Sensitive     CIPROFLOXACIN <=0.25 SENSITIVE Sensitive     GENTAMICIN <=1 SENSITIVE Sensitive     IMIPENEM <=0.25 SENSITIVE Sensitive     NITROFURANTOIN 32 SENSITIVE Sensitive     TRIMETH/SULFA >=320 RESISTANT Resistant     AMPICILLIN/SULBACTAM 4 SENSITIVE Sensitive     PIP/TAZO <=4 SENSITIVE Sensitive     Extended ESBL NEGATIVE Sensitive     * 50,000 COLONIES/mL ESCHERICHIA COLI  SARS Coronavirus 2 by RT PCR (hospital order, performed in [facility] hospital lab) Nasopharyngeal Nasopharyngeal Swab     Status: None   Collection Time: 04/20/19  4:38 PM   Specimen: Nasopharyngeal Swab  Result Value Ref Range Status   SARS Coronavirus 2 NEGATIVE NEGATIVE Final    Comment: (NOTE) If result is NEGATIVE SARS-CoV-2 target nucleic acids are NOT DETECTED. The SARS-CoV-2 RNA is generally detectable in upper and lower  respiratory specimens during the acute phase of infection. The lowest  concentration of SARS-CoV-2 viral copies this assay can detect is 250  copies / mL. A negative result does not preclude SARS-CoV-2 infection  and should not be used as the sole basis for treatment or other  patient management decisions.  A negative result may occur with  improper specimen collection / handling, submission of specimen other  than nasopharyngeal swab, presence of viral mutation(s) within the  areas targeted by this assay, and inadequate number of viral copies  (<250 copies / mL). A negative result must be  combined with clinical  observations, patient history, and epidemiological information. If result is POSITIVE SARS-CoV-2 target nucleic acids are DETECTED. The SARS-CoV-2 RNA is generally detectable in upper and lower  respiratory specimens dur ing the acute phase of infection.  Positive  results are indicative of active infection with SARS-CoV-2.  Clinical  correlation with patient history and other diagnostic information is  necessary to determine patient infection status.   Positive results do  not rule out bacterial infection or co-infection with other viruses. If result is PRESUMPTIVE POSTIVE SARS-CoV-2 nucleic acids MAY BE PRESENT.   A presumptive positive result was obtained on the submitted specimen  and confirmed on repeat testing.  While 2019 novel coronavirus  (SARS-CoV-2) nucleic acids may be present in the submitted sample  additional confirmatory testing may be necessary for epidemiological  and / or clinical management purposes  to differentiate between  SARS-CoV-2 and other Sarbecovirus currently known to infect humans.  If clinically indicated additional testing with an alternate test  methodology [phone]) is advised. The SARS-CoV-2 RNA is generally  detectable in upper and lower respiratory sp ecimens during the acute  phase of infection. The expected result is Negative. Fact Sheet for Patients:  StrictlyIdeas.no Fact Sheet for Healthcare Providers: BankingDealers.co.za This test is not yet approved or cleared by the [place] FDA and has been authorized for detection and/or diagnosis of SARS-CoV-2 by FDA under an Emergency Use Authorization (EUA).  This EUA will remain in effect (meaning this test can be used) for the duration of the COVID-19 declaration under Section 564(b)(1) of the Act, 21 U.S.C. section 360bbb-3(b)(1), unless the authorization is terminated or revoked sooner. Performed at [facility], [address], [place], [place] [address]   Body fluid culture     Status: None   Collection Time: 04/21/19 12:01 AM   Specimen: Urine, Random  Result Value Ref Range Status   Specimen Description   Final    URINE, RANDOM Performed at [facility] [address] [place], [place] [address]    Special Requests   Final    RIGHT KIDNEY Performed at [facility], [place] [address], [place], [place] [address]    Gram Stain   Final    FEW WBC  PRESENT, PREDOMINANTLY PMN ABUNDANT GRAM NEGATIVE RODS ABUNDANT GRAM POSITIVE COCCI IN PAIRS Performed at [facility], [place] [address] [place], [place] [address]    Culture   Final    ABUNDANT ESCHERICHIA COLI MIXED ANAEROBIC FLORA PRESENT.  CALL LAB IF FURTHER IID REQUIRED.    Report Status 04/24/2019 FINAL  Final   Organism ID, Bacteria ESCHERICHIA COLI  Final      Susceptibility   Escherichia coli - MIC*    AMPICILLIN 16 INTERMEDIATE Intermediate     CEFAZOLIN <=4 SENSITIVE Sensitive     CEFTRIAXONE <=1 SENSITIVE Sensitive     CIPROFLOXACIN <=0.25 SENSITIVE Sensitive     GENTAMICIN <=1 SENSITIVE Sensitive     IMIPENEM <=0.25 SENSITIVE Sensitive     NITROFURANTOIN 32 SENSITIVE Sensitive     TRIMETH/SULFA >=320 RESISTANT Resistant     AMPICILLIN/SULBACTAM 4 SENSITIVE Sensitive     PIP/TAZO <=4 SENSITIVE Sensitive     Extended ESBL NEGATIVE Sensitive     * ABUNDANT ESCHERICHIA COLI  Culture, blood (routine x 2)     Status: None (Preliminary result)   Collection Time: 04/21/19  3:39 PM   Specimen: BLOOD  Result Value Ref Range Status   Specimen Description  Final    BLOOD LEFT ARM Performed at [facility], [address], [place], [place] [address]    Special Requests   Final    BOTTLES DRAWN AEROBIC ONLY Blood Culture adequate volume Performed at [facility], [address], [place], [place] [address]    Culture   Final    NO GROWTH 2 DAYS Performed at [facility], [address], [place], [place] [address]    Report Status PENDING  Incomplete  Culture, blood (routine x 2)     Status: None (Preliminary result)   Collection Time: 04/21/19  3:45 PM   Specimen: BLOOD LEFT HAND  Result Value Ref Range Status   Specimen Description   Final    BLOOD LEFT HAND Performed at [facility], [address] [place], [place] [address]    Special Requests   Final    BOTTLES DRAWN AEROBIC ONLY Blood Culture  adequate volume Performed at [facility], [address] [place], [place] [address]    Culture   Final    NO GROWTH 2 DAYS Performed at [facility], [address], [place], [place] [address]    Report Status PENDING  Incomplete  MRSA PCR Screening     Status: None   Collection Time: 04/21/19  6:25 PM   Specimen: Nasopharyngeal  Result Value Ref Range Status   MRSA by PCR NEGATIVE NEGATIVE Final    Comment:        The GeneXpert MRSA Assay (FDA approved for NASAL specimens only), is one component of a comprehensive MRSA colonization surveillance program. It is not intended to diagnose MRSA infection nor to guide or monitor treatment for MRSA infections. Performed at [facility], [address], [place], [place] [address]      Time coordinating discharge:  32 minutes  SIGNED:   Dorcas Carrow, MD  [facility] 04/24/2019, 1:40 PM

## 2019-04-26 LAB — CULTURE, BLOOD (ROUTINE X 2)
Culture: NO GROWTH
Culture: NO GROWTH
Special Requests: ADEQUATE
Special Requests: ADEQUATE

## 2019-05-04 ENCOUNTER — Emergency Department (HOSPITAL_COMMUNITY)
Admission: EM | Admit: 2019-05-04 | Discharge: 2019-05-04 | Disposition: A | Discharge status: Home / Self Care | Payer: Medicaid Other | Attending: Emergency Medicine | Admitting: Emergency Medicine

## 2019-05-04 ENCOUNTER — Encounter (HOSPITAL_COMMUNITY): Payer: Self-pay | Admitting: Emergency Medicine

## 2019-05-04 ENCOUNTER — Other Ambulatory Visit: Payer: Self-pay

## 2019-05-04 DIAGNOSIS — F17210 Nicotine dependence, cigarettes, uncomplicated: Secondary | ICD-10-CM | POA: Insufficient documentation

## 2019-05-04 DIAGNOSIS — T83512A Infection and inflammatory reaction due to nephrostomy catheter, initial encounter: Secondary | ICD-10-CM | POA: Insufficient documentation

## 2019-05-04 DIAGNOSIS — Y731 Therapeutic (nonsurgical) and rehabilitative gastroenterology and urology devices associated with adverse incidents: Secondary | ICD-10-CM | POA: Insufficient documentation

## 2019-05-04 DIAGNOSIS — Z5189 Encounter for other specified aftercare: Secondary | ICD-10-CM

## 2019-05-04 LAB — CBC WITH DIFFERENTIAL/PLATELET
Abs Immature Granulocytes: 0.07 10*3/uL (ref 0.00–0.07)
Basophils Absolute: 0 10*3/uL (ref 0.0–0.1)
Basophils Relative: 0 %
Eosinophils Absolute: 0.1 10*3/uL (ref 0.0–0.5)
Eosinophils Relative: 1 %
HCT: 36 % (ref 36.0–46.0)
Hemoglobin: 10.9 g/dL — ABNORMAL LOW (ref 12.0–15.0)
Immature Granulocytes: 1 %
Lymphocytes Relative: 16 %
Lymphs Abs: 2.1 10*3/uL (ref 0.7–4.0)
MCH: 25.7 pg — ABNORMAL LOW (ref 26.0–34.0)
MCHC: 30.3 g/dL (ref 30.0–36.0)
MCV: 84.9 fL (ref 80.0–100.0)
Monocytes Absolute: 0.9 10*3/uL (ref 0.1–1.0)
Monocytes Relative: 7 %
Neutro Abs: 9.5 10*3/uL — ABNORMAL HIGH (ref 1.7–7.7)
Neutrophils Relative %: 75 %
Platelets: 546 10*3/uL — ABNORMAL HIGH (ref 150–400)
RBC: 4.24 MIL/uL (ref 3.87–5.11)
RDW: 13.7 % (ref 11.5–15.5)
WBC: 12.7 10*3/uL — ABNORMAL HIGH (ref 4.0–10.5)
nRBC: 0 % (ref 0.0–0.2)

## 2019-05-04 LAB — COMPREHENSIVE METABOLIC PANEL
ALT: 8 U/L (ref 0–44)
AST: 11 U/L — ABNORMAL LOW (ref 15–41)
Albumin: 3.4 g/dL — ABNORMAL LOW (ref 3.5–5.0)
Alkaline Phosphatase: 79 U/L (ref 38–126)
Anion gap: 10 (ref 5–15)
BUN: 14 mg/dL (ref 6–20)
CO2: 28 mmol/L (ref 22–32)
Calcium: 8.8 mg/dL — ABNORMAL LOW (ref 8.9–10.3)
Chloride: 97 mmol/L — ABNORMAL LOW (ref 98–111)
Creatinine, Ser: 0.89 mg/dL (ref 0.44–1.00)
GFR calc Af Amer: >60 mL/min (ref >60)
GFR calc non Af Amer: >60 mL/min (ref >60)
Glucose, Bld: 105 mg/dL — ABNORMAL HIGH (ref 70–99)
Potassium: 4.3 mmol/L (ref 3.5–5.1)
Sodium: 135 mmol/L (ref 135–145)
Total Bilirubin: 0.5 mg/dL (ref 0.3–1.2)
Total Protein: 8.4 g/dL — ABNORMAL HIGH (ref 6.5–8.1)

## 2019-05-04 LAB — URINALYSIS, ROUTINE W REFLEX MICROSCOPIC
Bacteria, UA: NONE SEEN
Bilirubin Urine: NEGATIVE
Glucose, UA: NEGATIVE mg/dL
Hgb urine dipstick: NEGATIVE
Ketones, ur: NEGATIVE mg/dL
Nitrite: NEGATIVE
Protein, ur: 30 mg/dL — AB
Specific Gravity, Urine: 1.024 (ref 1.005–1.030)
pH: 5 (ref 5.0–8.0)

## 2019-05-04 MED ORDER — DOXYCYCLINE HYCLATE 100 MG PO TABS
100.0000 mg | ORAL_TABLET | Freq: Once | ORAL | Status: AC
  Administered 2019-05-04: 100 mg via ORAL
  Filled 2019-05-04: qty 1

## 2019-05-04 MED ORDER — DOXYCYCLINE HYCLATE 100 MG PO CAPS: 100.0000 mg | ORAL_CAPSULE | Freq: Two times a day (BID) | ORAL | 0 refills | Status: DC

## 2019-05-04 NOTE — ED Triage Notes (Signed)
Patient c/o possible infection to nephrostomy. Patient had nephrostomy done on 10/10. Per patient drainage and odor noted Wednesday. Per patient called the surgeons office Wednesday and was told "nothing to worry about." Patient seen by nephrologist yesterday and seen new doctor and drainage was noted on second dressing but site was not assessed. Patient states antibiotic was completed but nephrologist wanted to get UA back prior to extending her antibiotic.Patient states drainage color has changed and gotten darker since yesterday.

## 2019-05-04 NOTE — ED Notes (Signed)
Not in [facility] when [facility]

## 2019-05-04 NOTE — ED Provider Notes (Signed)
[facility] EMERGENCY DEPARTMENT Provider Note   CSN: [id number] Arrival date & time: 05/04/19  1704     History   Chief Complaint Chief Complaint  Patient presents with  . Post-op Problem    HPI Pamela Horn is a 38 y.o. female.     Patient is a 38 year old female who presents to the emergency department with a complaint of drainage from her nephrostomy tube.  The patient states that on October 10, she was found to have infected kidney stones.  She was sent to the [facility] where she had a nephrostomy tube placed on the right.  The patient states she was placed on 10-day antibiotics.  She says she is noticing drainage from the nephrostomy site.  She says that it started out yellow and now it seems to be more green.  The patient is concerned for infection.  She has been seen by her urology specialist.  She is scheduled to be rechecked sometime next week.  Patient is extremely concerned that she may be getting infected and gets sick all over again.  The history is provided by the patient.    Past Medical History:  Diagnosis Date  . Pre-eclampsia    1st pregnancy  . Pregnancy induced hypertension     Patient Active Problem List   Diagnosis Date Noted  . Pyelonephritis 04/20/2019  . 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 Opioid use disorder ([place]) 04/20/2019  . Smoker 05/26/2014    Past Surgical History:  Procedure Laterality Date  . CESAREAN SECTION  10/18/1999  . CESAREAN SECTION N/A 12/02/2014   Procedure: CESAREAN SECTION (MULTIPLE) REPEAT;  Surgeon: Florian Buff, MD;  Location: [facility];  Service: Obstetrics;  Laterality: N/A;  . COLPOSCOPY  04/04/2000 & 10/03/2000  . GYNECOLOGIC CRYOSURGERY  11/07/2000  . IR NEPHROSTOMY PLACEMENT RIGHT  04/21/2019  . LAPAROSCOPIC BILATERAL SALPINGECTOMY Bilateral 02/25/2015   Procedure: LAPAROSCOPIC BILATERAL SALPINGECTOMY;  Surgeon: Florian Buff, MD;  Location: [facility];  Service:  Gynecology;  Laterality: Bilateral;     OB History    Gravida  2   Para  2   Term  1   Preterm  1   AB      Living  3     SAB      TAB      Ectopic      Multiple  1   Live Births  1            Home Medications    Prior to Admission medications   Medication Sig Start Date End Date Taking? Authorizing Provider  Buprenorphine HCl-Naloxone HCl 8-2 MG FILM Place 0.25 Film under the tongue 2 (two) times daily. 1/4 FILM under the tongue twice daily 03/27/18   [provider]  cephALEXin (KEFLEX) 500 MG capsule Take 1 capsule (500 mg total) by mouth 3 (three) times daily for 10 days. 04/24/19 05/04/19  Barb Merino, MD  phenazopyridine (AZO-STANDARD) 95 MG tablet Take 95-190 mg by mouth daily as needed for pain.    [provider]    Family History Family History  Problem Relation Age of Onset  . Hypertension Mother   . Cancer Father        melanoma  . Cancer Sister   . Diabetes Paternal Grandfather     Social History Social History   Tobacco Use  . Smoking status: Current Every Day Smoker    Packs/day: 1.00    Years: 24.00  Pack years: 24.00    Types: Cigarettes  . Smokeless tobacco: Never Used  Substance Use Topics  . Alcohol use: No  . Drug use: No     Allergies   Bactrim [sulfamethoxazole-trimethoprim]   Review of Systems Review of Systems  Constitutional: Negative for activity change and appetite change.  HENT: Negative for congestion, ear discharge, ear pain, facial swelling, nosebleeds, rhinorrhea, sneezing and tinnitus.   Eyes: Negative for photophobia, pain and discharge.  Respiratory: Negative for cough, choking, shortness of breath and wheezing.   Cardiovascular: Negative for chest pain, palpitations and leg swelling.  Gastrointestinal: Negative for abdominal pain, blood in stool, constipation, diarrhea, nausea and vomiting.  Genitourinary: Negative for difficulty urinating, dysuria, flank pain, frequency and  hematuria.  Musculoskeletal: Negative for back pain, gait problem, myalgias and neck pain.  Skin: Positive for wound. Negative for color change and rash.       Right nephrostomy tube  Neurological: Negative for dizziness, seizures, syncope, facial asymmetry, speech difficulty, weakness and numbness.  Hematological: Negative for adenopathy. Does not bruise/bleed easily.  Psychiatric/Behavioral: Negative for agitation, confusion, hallucinations, self-injury and suicidal ideas. The patient is nervous/anxious.      Physical Exam Updated Vital Signs BP 124/85 (BP Location: Right Arm)   Pulse 92   Temp 99 F (37.2 C) (Oral)   Resp 18   Ht 5\' 3"  (1.6 m)   Wt 78.5 kg   LMP 04/02/2019   SpO2 99%   BMI 30.65 kg/m   Physical Exam Vitals signs and nursing note reviewed.  Constitutional:      Appearance: She is well-developed. She is not toxic-appearing.  HENT:     Head: Normocephalic.     Right Ear: Tympanic membrane and external ear normal.     Left Ear: Tympanic membrane and external ear normal.  Eyes:     General: Lids are normal.     Pupils: Pupils are equal, round, and reactive to light.  Neck:     Musculoskeletal: Normal range of motion and neck supple.     Vascular: No carotid bruit.  Cardiovascular:     Rate and Rhythm: Normal rate and regular rhythm.     Pulses: Normal pulses.     Heart sounds: Normal heart sounds.  Pulmonary:     Effort: No respiratory distress.     Breath sounds: Normal breath sounds.  Abdominal:     General: Bowel sounds are normal.     Palpations: Abdomen is soft.     Tenderness: There is no abdominal tenderness. There is no guarding.  Musculoskeletal: Normal range of motion.  Lymphadenopathy:     Head:     Right side of head: No submandibular adenopathy.     Left side of head: No submandibular adenopathy.     Cervical: No cervical adenopathy.  Skin:    General: Skin is warm and dry.          Comments: I remove the dressings around the  nephrostomy tube.  The nephrostomy tube remains in place.  There is a yellow-green mucousy drainage from the nephrostomy site.  There is no red streaking appreciated.  The area is not hot.   Neurological:     Mental Status: She is alert and oriented to person, place, and time.     Cranial Nerves: No cranial nerve deficit.     Sensory: No sensory deficit.  Psychiatric:        Speech: Speech normal.      ED Treatments /  Results  Labs (all labs ordered are listed, but only abnormal results are displayed) Labs Reviewed  CBC WITH DIFFERENTIAL/PLATELET - Abnormal; Notable for the following components:      Result Value   WBC 12.7 (*)    Hemoglobin 10.9 (*)    MCH 25.7 (*)    Platelets 546 (*)    Neutro Abs 9.5 (*)    All other components within normal limits  COMPREHENSIVE METABOLIC PANEL - Abnormal; Notable for the following components:   Chloride 97 (*)    Glucose, Bld 105 (*)    Calcium 8.8 (*)    Total Protein 8.4 (*)    Albumin 3.4 (*)    AST 11 (*)    All other components within normal limits  URINALYSIS, ROUTINE W REFLEX MICROSCOPIC    EKG None  Radiology No results found.  Procedures Procedures (including critical care time)  Medications Ordered in ED Medications - No data to display   Initial Impression / Assessment and Plan / ED Course  I have reviewed the triage vital signs and the nursing notes.  Pertinent labs & imaging results that were available during my care of the patient were reviewed by me and considered in my medical decision making (see chart for details).          Final Clinical Impressions(s) / ED Diagnoses  MDM  Patient had a nephrostomy tube placed on October 10 because of infected kidney stones.  Patient notices a yellowish-green drainage from the nephrostomy site.  She is concerned for infection.  The patient denies nausea, vomiting, fever, or chills.  Culture of the drainage sent to the lab.  Patient will be placed on doxycycline  until she is seen by her nephrology specialist.  I have asked the patient to have the area cleansed and dressing changed daily.  Patient is to notify the urologist, or return to the emergency room if any high fever, nausea/vomiting, changes or concerns.   Final diagnoses:  Nephrostomy complication [facility])    ED Discharge Orders    None       Ivery Quale, Cordelia Poche 05/04/19 2125    Bethann Berkshire, MD 05/05/19 2019

## 2019-05-04 NOTE — Discharge Instructions (Addendum)
Your nephrostomy site has been redressed.  A culture has been sent to the lab.  Please use doxycycline 2 times daily with food until you are seen by your urology specialist.  Please see Dr. Karie Kirks, or your urology specialist, or return to the emergency department if any high fever, nausea/vomiting, excessive pain, changes in your general condition, problems or concerns.

## 2019-05-06 ENCOUNTER — Other Ambulatory Visit: Payer: Self-pay | Admitting: Urology

## 2019-05-07 LAB — AEROBIC CULTURE W GRAM STAIN (SUPERFICIAL SPECIMEN)

## 2019-05-08 ENCOUNTER — Telehealth: Payer: Self-pay | Admitting: *Deleted

## 2019-05-08 NOTE — Progress Notes (Signed)
ED Antimicrobial Stewardship Positive Culture Follow Up   Pamela Horn is an 38 y.o. female who presented to [facility] with a chief complaint of  Chief Complaint  Patient presents with  . Post-op Problem    Recent Results (from the past 720 hour(s))  Urine culture     Status: Abnormal   Collection Time: 04/20/19  1:18 PM   Specimen: Urine, Clean Catch  Result Value Ref Range Status   Specimen Description   Final    URINE, CLEAN CATCH Performed at [facility], [address] [place], [place] [address]    Special Requests   Final    NONE Performed at [facility], [address] [place], [place] [address]    Culture 50,000 COLONIES/mL ESCHERICHIA COLI (A)  Final   Report Status 04/23/2019 FINAL  Final   Organism ID, Bacteria ESCHERICHIA COLI (A)  Final      Susceptibility   Escherichia coli - MIC*    AMPICILLIN 16 INTERMEDIATE Intermediate     CEFAZOLIN <=4 SENSITIVE Sensitive     CEFTRIAXONE <=1 SENSITIVE Sensitive     CIPROFLOXACIN <=0.25 SENSITIVE Sensitive     GENTAMICIN <=1 SENSITIVE Sensitive     IMIPENEM <=0.25 SENSITIVE Sensitive     NITROFURANTOIN 32 SENSITIVE Sensitive     TRIMETH/SULFA >=320 RESISTANT Resistant     AMPICILLIN/SULBACTAM 4 SENSITIVE Sensitive     PIP/TAZO <=4 SENSITIVE Sensitive     Extended ESBL NEGATIVE Sensitive     * 50,000 COLONIES/mL ESCHERICHIA COLI  SARS Coronavirus 2 by RT PCR (hospital order, performed in [facility] hospital lab) Nasopharyngeal Nasopharyngeal Swab     Status: None   Collection Time: 04/20/19  4:38 PM   Specimen: Nasopharyngeal Swab  Result Value Ref Range Status   SARS Coronavirus 2 NEGATIVE NEGATIVE Final    Comment: (NOTE) If result is NEGATIVE SARS-CoV-2 target nucleic acids are NOT DETECTED. The SARS-CoV-2 RNA is generally detectable in upper and lower  respiratory specimens during the acute phase of infection. The lowest  concentration of SARS-CoV-2 viral copies this assay can detect is 250   copies / mL. A negative result does not preclude SARS-CoV-2 infection  and should not be used as the sole basis for treatment or other  patient management decisions.  A negative result may occur with  improper specimen collection / handling, submission of specimen other  than nasopharyngeal swab, presence of viral mutation(s) within the  areas targeted by this assay, and inadequate number of viral copies  (<250 copies / mL). A negative result must be combined with clinical  observations, patient history, and epidemiological information. If result is POSITIVE SARS-CoV-2 target nucleic acids are DETECTED. The SARS-CoV-2 RNA is generally detectable in upper and lower  respiratory specimens dur ing the acute phase of infection.  Positive  results are indicative of active infection with SARS-CoV-2.  Clinical  correlation with patient history and other diagnostic information is  necessary to determine patient infection status.  Positive results do  not rule out bacterial infection or co-infection with other viruses. If result is PRESUMPTIVE POSTIVE SARS-CoV-2 nucleic acids MAY BE PRESENT.   A presumptive positive result was obtained on the submitted specimen  and confirmed on repeat testing.  While 2019 novel coronavirus  (SARS-CoV-2) nucleic acids may be present in the submitted sample  additional confirmatory testing may be necessary for epidemiological  and / or clinical management purposes  to differentiate between  SARS-CoV-2 and other Sarbecovirus currently known  to infect humans.  If clinically indicated additional testing with an alternate test  methodology [phone]) is advised. The SARS-CoV-2 RNA is generally  detectable in upper and lower respiratory sp ecimens during the acute  phase of infection. The expected result is Negative. Fact Sheet for Patients:  BoilerBrush.com.cy Fact Sheet for Healthcare  Providers: https://pope.com/ This test is not yet approved or cleared by the [place] FDA and has been authorized for detection and/or diagnosis of SARS-CoV-2 by FDA under an Emergency Use Authorization (EUA).  This EUA will remain in effect (meaning this test can be used) for the duration of the COVID-19 declaration under Section 564(b)(1) of the Act, 21 U.S.C. section 360bbb-3(b)(1), unless the authorization is terminated or revoked sooner. Performed at [facility], [address], [place], [place] [address]   Body fluid culture     Status: None   Collection Time: 04/21/19 12:01 AM   Specimen: Urine, Random  Result Value Ref Range Status   Specimen Description   Final    URINE, RANDOM Performed at [facility], [address] [place], [place] [address]    Special Requests   Final    RIGHT KIDNEY Performed at [facility], [address] [place], [place] [address]    Gram Stain   Final    FEW WBC PRESENT, PREDOMINANTLY PMN ABUNDANT GRAM NEGATIVE RODS ABUNDANT GRAM POSITIVE COCCI IN PAIRS Performed at [facility], [address], [place], [place] [address]    Culture   Final    ABUNDANT ESCHERICHIA COLI MIXED ANAEROBIC FLORA PRESENT.  CALL LAB IF FURTHER IID REQUIRED.    Report Status 04/24/2019 FINAL  Final   Organism ID, Bacteria ESCHERICHIA COLI  Final      Susceptibility   Escherichia coli - MIC*    AMPICILLIN 16 INTERMEDIATE Intermediate     CEFAZOLIN <=4 SENSITIVE Sensitive     CEFTRIAXONE <=1 SENSITIVE Sensitive     CIPROFLOXACIN <=0.25 SENSITIVE Sensitive     GENTAMICIN <=1 SENSITIVE Sensitive     IMIPENEM <=0.25 SENSITIVE Sensitive     NITROFURANTOIN 32 SENSITIVE Sensitive     TRIMETH/SULFA >=320 RESISTANT Resistant     AMPICILLIN/SULBACTAM 4 SENSITIVE Sensitive     PIP/TAZO <=4 SENSITIVE Sensitive     Extended ESBL NEGATIVE Sensitive     * ABUNDANT ESCHERICHIA COLI  Culture, blood  (routine x 2)     Status: None   Collection Time: 04/21/19  3:39 PM   Specimen: BLOOD  Result Value Ref Range Status   Specimen Description   Final    BLOOD LEFT ARM Performed at [facility], [address], [place], [place] [address]    Special Requests   Final    BOTTLES DRAWN AEROBIC ONLY Blood Culture adequate volume Performed at [facility], [address], [place], [place] [address]    Culture   Final    NO GROWTH 5 DAYS Performed at [facility], [address] [place], [place] [address]    Report Status 04/26/2019 FINAL  Final  Culture, blood (routine x 2)     Status: None   Collection Time: 04/21/19  3:45 PM   Specimen: BLOOD LEFT HAND  Result Value Ref Range Status   Specimen Description   Final    BLOOD LEFT HAND Performed at [facility], [address] [place], [place] [address]    Special Requests   Final    BOTTLES  DRAWN AEROBIC ONLY Blood Culture adequate volume Performed at [place] [address] [place], [place] [address]    Culture   Final    NO GROWTH 5 DAYS Performed at [facility], [place] [address], [place], [place] [address]    Report Status 04/26/2019 FINAL  Final  MRSA PCR Screening     Status: None   Collection Time: 04/21/19  6:25 PM   Specimen: Nasopharyngeal  Result Value Ref Range Status   MRSA by PCR NEGATIVE NEGATIVE Final    Comment:        The GeneXpert MRSA Assay (FDA approved for NASAL specimens only), is one component of a comprehensive MRSA colonization surveillance program. It is not intended to diagnose MRSA infection nor to guide or monitor treatment for MRSA infections. Performed at [facility], [place] [address], [place], [place] [address]   Wound or Superficial Culture     Status: None   Collection Time: 05/04/19  9:00 PM   Specimen: Back; Wound  Result Value Ref Range Status   Specimen Description   Final     BACK Performed at [facility], [address], [place], [place] [address]    Special Requests   Final    NONE Performed at [facility], [address], [place], [place] [address]    Gram Stain   Final    RARE WBC PRESENT, PREDOMINANTLY PMN FEW GRAM POSITIVE COCCI RARE GRAM NEGATIVE RODS RARE GRAM POSITIVE RODS Performed at [facility], [place] [address], [place], [place] [address]    Culture FEW ESCHERICHIA COLI  Final   Report Status 05/07/2019 FINAL  Final   Organism ID, Bacteria ESCHERICHIA COLI  Final      Susceptibility   Escherichia coli - MIC*    AMPICILLIN >=32 RESISTANT Resistant     CEFAZOLIN <=4 SENSITIVE Sensitive     CEFEPIME <=1 SENSITIVE Sensitive     CEFTAZIDIME <=1 SENSITIVE Sensitive     CEFTRIAXONE <=1 SENSITIVE Sensitive     CIPROFLOXACIN <=0.25 SENSITIVE Sensitive     GENTAMICIN <=1 SENSITIVE Sensitive     IMIPENEM <=0.25 SENSITIVE Sensitive     TRIMETH/SULFA >=320 RESISTANT Resistant     AMPICILLIN/SULBACTAM 8 SENSITIVE Sensitive     PIP/TAZO <=4 SENSITIVE Sensitive     Extended ESBL NEGATIVE Sensitive     * FEW ESCHERICHIA COLI    [x]  Treated with doxycycline, organism resistant to prescribed antimicrobial []  Patient discharged originally without antimicrobial agent and treatment is now indicated  New antibiotic prescription: DC doxycycline, start cephalexin 500mg  PO QID x 5 days  ED Provider: Lenn Sink, PA   Pamela Horn, Pamela Horn 05/08/2019, 9:24 AM Clinical Pharmacist Monday - Friday phone -  [phone] Saturday - Sunday phone - [phone]

## 2019-05-08 NOTE — Telephone Encounter (Signed)
Post ED Visit - Positive Culture Follow-up: Successful Patient Follow-Up  Culture assessed and recommendations reviewed by:  []  Elenor Quinones, Pharm.D. []  Heide Guile, Pharm.D., BCPS AQ-ID []  Parks Neptune, Pharm.D., BCPS []  Alycia Rossetti, Pharm.D., BCPS []  [place], Pharm.D., BCPS, AAHIVP []  Legrand Como, Pharm.D., BCPS, AAHIVP [x]  Salome Arnt, PharmD, BCPS []  Johnnette Gourd, PharmD, BCPS []  Hughes Better, PharmD, BCPS []  Leeroy Cha, PharmD  Positive wound culture  []  Patient discharged without antimicrobial prescription and treatment is now indicated [x]  Organism is resistant to prescribed ED discharge antimicrobial []  Patient with positive blood cultures  Changes discussed with ED provider: Lenn Sink, PA New antibiotic prescription Keflex 500mg  PO QID x 5 days, Stop Doxycycline Called to Jarold Motto, [place] patient, date 05/08/2019, time [employer], [place] 05/08/2019, 11:18 AM

## 2019-05-10 ENCOUNTER — Other Ambulatory Visit: Payer: Self-pay

## 2019-05-10 ENCOUNTER — Other Ambulatory Visit (HOSPITAL_COMMUNITY)
Admission: RE | Admit: 2019-05-10 | Discharge: 2019-05-10 | Disposition: A | Discharge status: Home / Self Care | Payer: Medicaid Other | Source: Ambulatory Visit | Attending: Urology | Admitting: Urology

## 2019-05-10 DIAGNOSIS — Z20828 Contact with and (suspected) exposure to other viral communicable diseases: Secondary | ICD-10-CM | POA: Diagnosis present

## 2019-05-10 LAB — SARS CORONAVIRUS 2 (TAT 6-24 HRS): SARS Coronavirus 2: NEGATIVE

## 2019-05-13 ENCOUNTER — Encounter (HOSPITAL_BASED_OUTPATIENT_CLINIC_OR_DEPARTMENT_OTHER): Payer: Self-pay | Admitting: *Deleted

## 2019-05-13 ENCOUNTER — Other Ambulatory Visit: Payer: Self-pay

## 2019-05-13 NOTE — Progress Notes (Signed)
Spoke with patient via telephone for pre op interview. NPO after MN. Will need UPT. Arrival time 1000.  Case Reviewed with Konrad Felix, PA due to patient taking [place]  BID. Per anesthesia PA review ok to proceed.

## 2019-05-13 NOTE — H&P (Deleted)
CC/HPI: cc: Scrotal swelling   05/02/19: 38 year old man with a history of a right epididymal cyst presents with enlargement of the right hemiscrotum. He feels like he has a lower testicle. A scrotal ultrasound obtained today is consistent with a very large right epididymal cyst. He has no voiding symptoms and denies hematuria, UTIs are history of kidney stones. He is not on any blood thinners except for baby aspirin. He is interested in surgical intervention.     ALLERGIES: No Known Allergies    MEDICATIONS: Aspirin 81 mg tablet,chewable  Lisinopril 10 mg tablet     GU PSH: None   NON-GU PSH: None   GU PMH: None   NON-GU PMH: Anxiety Hypertension    FAMILY HISTORY: None   SOCIAL HISTORY: Marital Status: Divorced Preferred Language: English; Ethnicity: Not Hispanic Or Latino; Race: White Current Smoking Status: Patient does not smoke anymore. Has not smoked since 04/10/2010.   Tobacco Use Assessment Completed: Used Tobacco in last 30 days? Has never drank.  Drinks 3 caffeinated drinks per day.    REVIEW OF SYSTEMS:    GU Review Female:   Patient reports erection problems. Patient denies frequent urination, hard to postpone urination, burning/ pain with urination, get up at night to urinate, leakage of urine, stream starts and stops, trouble starting your stream, have to strain to urinate , and penile pain.  Gastrointestinal (Upper):   Patient denies nausea, vomiting, and indigestion/ heartburn.  Gastrointestinal (Lower):   Patient denies diarrhea and constipation.  Constitutional:   Patient denies fever, night sweats, weight loss, and fatigue.  Skin:   Patient denies skin rash/ lesion and itching.  Eyes:   Patient denies blurred vision and double vision.  Ears/ Nose/ Throat:   Patient denies sore throat and sinus problems.  Hematologic/Lymphatic:   Patient denies swollen glands and easy bruising.  Cardiovascular:   Patient denies leg swelling and chest pains.  Respiratory:    Patient denies cough and shortness of breath.  Endocrine:   Patient denies excessive thirst.  Musculoskeletal:   Patient denies back pain and joint pain.  Neurological:   Patient denies headaches and dizziness.  Psychologic:   Patient denies depression and anxiety.   VITAL SIGNS:      05/02/2019 02:35 PM  Weight 173 lb / 78.47 kg  Height 71 in / 180.34 cm  BP 131/81 mmHg  Pulse 71 /min  Temperature 97.7 F / 36.5 C  BMI 24.1 kg/m   GU PHYSICAL EXAMINATION:    Scrotum: No lesions. No edema. No cysts. No warts.  Epididymides: Right: right body contains a large greater than 5 cm cyst, nontender. Right: No spermatocele, no masses, no tenderness, no induration, no enlargement. Left: No spermatocele, no masses, no cysts, no tenderness, no induration, no enlargement.   Testes: No tenderness, no swelling, no enlargement left testes. No tenderness, no swelling, no enlargement right testes. Normal location left testes. Normal location right testes. No mass, no cyst, no varicocele, no hydrocele left testes. No mass, no cyst, no varicocele, no hydrocele right testes.  Urethral Meatus: Normal size. No lesion, no wart, no discharge, no polyp. Normal location.  Penis: Circumcised, no warts, no cracks. No dorsal Peyronie's plaques, no left corporal Peyronie's plaques, no right corporal Peyronie's plaques, no scarring, no warts. No balanitis, no meatal stenosis.   MULTI-SYSTEM PHYSICAL EXAMINATION:    Constitutional: Well-nourished. No physical deformities. Normally developed. Good grooming.  Neck: Neck symmetrical, not swollen. Normal tracheal position.  Respiratory: No labored breathing, no  use of accessory muscles.   Cardiovascular: Normal temperature, normal extremity pulses, no swelling, no varicosities.  Skin: No paleness, no jaundice, no cyanosis. No lesion, no ulcer, no rash.  Neurologic / Psychiatric: Oriented to time, oriented to place, oriented to person. No depression, no anxiety, no  agitation.  Gastrointestinal: non obese abdomen.   Eyes: Normal conjunctivae. Normal eyelids.  Ears, Nose, Mouth, and Throat: Left ear no scars, no lesions, no masses. Right ear no scars, no lesions, no masses. Nose no scars, no lesions, no masses. Normal hearing. Normal lips.  Musculoskeletal: Normal gait and station of head and neck.     PAST DATA REVIEWED:  Source Of History:  Patient  Records Review:   POC Tool  Urine Test Review:   Urinalysis  Notes:                     Creatinine 0.97 on 04/23/2019   PSA 1.7 on 04/23/2019   PROCEDURES:          Urinalysis Dipstick Dipstick Cont'd  Color: Yellow Bilirubin: Neg mg/dL  Appearance: Clear Ketones: Neg mg/dL  Specific Gravity: <=1.005 Blood: Neg ery/uL  pH: 6.0 Protein: Trace mg/dL  Glucose: Neg mg/dL Urobilinogen: 0.2 mg/dL    Nitrites: Neg    Leukocyte Esterase: Neg leu/uL    ASSESSMENT:      ICD-10 Details  1 GU:   Epididymal cyst - N50.3 Discussed management for the right epididymal cyst which include observation for surgical intervention. Patient wishes to proceed with surgery as it has been enlarging for the last 5 years and becoming more bothersome. I discussed the risks and benefits with the patient including pain, bleeding, damage to surrounding structures, damage to passed deferens and testicular blood supply, hematoma, and recurrence. Patient understands and wishes to proceed with surgery. We discussed the surgical approach, what the incisional look like and postoperative limitations and care. He will be contacted with the surgery date.   PLAN:           Document Letter(s):  Created for Patient: Clinical Summary         Notes:   Schedule surgery for excision of epididymal cyst right-hand side

## 2019-05-14 ENCOUNTER — Ambulatory Visit (HOSPITAL_BASED_OUTPATIENT_CLINIC_OR_DEPARTMENT_OTHER)
Admission: RE | Admit: 2019-05-14 | Discharge: 2019-05-14 | Disposition: A | Discharge status: Home / Self Care | Payer: Medicaid Other | Attending: Urology | Admitting: Urology

## 2019-05-14 ENCOUNTER — Ambulatory Visit (HOSPITAL_BASED_OUTPATIENT_CLINIC_OR_DEPARTMENT_OTHER): Payer: Medicaid Other | Admitting: Certified Registered"

## 2019-05-14 ENCOUNTER — Encounter (HOSPITAL_BASED_OUTPATIENT_CLINIC_OR_DEPARTMENT_OTHER)
Admission: RE | Disposition: A | Discharge status: Home / Self Care | Payer: Self-pay | Source: Home / Self Care | Attending: Urology

## 2019-05-14 ENCOUNTER — Other Ambulatory Visit: Payer: Self-pay

## 2019-05-14 ENCOUNTER — Encounter (HOSPITAL_BASED_OUTPATIENT_CLINIC_OR_DEPARTMENT_OTHER): Payer: Self-pay | Admitting: *Deleted

## 2019-05-14 DIAGNOSIS — N136 Pyonephrosis: Secondary | ICD-10-CM | POA: Insufficient documentation

## 2019-05-14 DIAGNOSIS — I10 Essential (primary) hypertension: Secondary | ICD-10-CM | POA: Diagnosis not present

## 2019-05-14 DIAGNOSIS — F17210 Nicotine dependence, cigarettes, uncomplicated: Secondary | ICD-10-CM | POA: Insufficient documentation

## 2019-05-14 DIAGNOSIS — Z79891 Long term (current) use of opiate analgesic: Secondary | ICD-10-CM | POA: Insufficient documentation

## 2019-05-14 HISTORY — DX: Personal history of urinary calculi: Z87.442

## 2019-05-14 HISTORY — PX: CYSTOSCOPY WITH RETROGRADE PYELOGRAM, URETEROSCOPY AND STENT PLACEMENT: SHX5789

## 2019-05-14 HISTORY — PX: HOLMIUM LASER APPLICATION: SHX5852

## 2019-05-14 LAB — POCT PREGNANCY, URINE: Preg Test, Ur: NEGATIVE

## 2019-05-14 SURGERY — CYSTOURETEROSCOPY, WITH RETROGRADE PYELOGRAM AND STENT INSERTION
Anesthesia: General | Site: Ureter | Laterality: Right

## 2019-05-14 MED ORDER — HYDROMORPHONE HCL 1 MG/ML IJ SOLN
0.2500 mg | INTRAMUSCULAR | Status: DC | PRN
  Filled 2019-05-14: qty 0.5

## 2019-05-14 MED ORDER — LACTATED RINGERS IV SOLN
INTRAVENOUS | Status: DC
  Administered 2019-05-14: 10:00:00 via INTRAVENOUS
  Filled 2019-05-14: qty 1000

## 2019-05-14 MED ORDER — ONDANSETRON HCL 4 MG/2ML IJ SOLN
INTRAMUSCULAR | Status: DC | PRN
  Administered 2019-05-14: 4 mg via INTRAVENOUS

## 2019-05-14 MED ORDER — KETOROLAC TROMETHAMINE 30 MG/ML IJ SOLN
INTRAMUSCULAR | Status: DC | PRN
  Administered 2019-05-14: 30 mg via INTRAVENOUS

## 2019-05-14 MED ORDER — CEFAZOLIN SODIUM-DEXTROSE 2-4 GM/100ML-% IV SOLN
INTRAVENOUS | Status: AC
  Filled 2019-05-14: qty 100

## 2019-05-14 MED ORDER — DEXAMETHASONE SODIUM PHOSPHATE 10 MG/ML IJ SOLN
INTRAMUSCULAR | Status: DC | PRN
  Administered 2019-05-14: 10 mg via INTRAVENOUS

## 2019-05-14 MED ORDER — OXYCODONE HCL 5 MG PO TABS
5.0000 mg | ORAL_TABLET | Freq: Once | ORAL | Status: DC | PRN
  Filled 2019-05-14: qty 1

## 2019-05-14 MED ORDER — OXYCODONE HCL 5 MG/5ML PO SOLN
5.0000 mg | Freq: Once | ORAL | Status: DC | PRN
  Filled 2019-05-14: qty 5

## 2019-05-14 MED ORDER — FENTANYL CITRATE (PF) 100 MCG/2ML IJ SOLN
INTRAMUSCULAR | Status: AC
  Filled 2019-05-14: qty 2

## 2019-05-14 MED ORDER — MIDAZOLAM HCL 2 MG/2ML IJ SOLN
INTRAMUSCULAR | Status: DC | PRN
  Administered 2019-05-14: 2 mg via INTRAVENOUS

## 2019-05-14 MED ORDER — SODIUM CHLORIDE 0.9 % IR SOLN
Status: DC | PRN
  Administered 2019-05-14: 1 via INTRAVESICAL

## 2019-05-14 MED ORDER — MIDAZOLAM HCL 2 MG/2ML IJ SOLN
INTRAMUSCULAR | Status: AC
  Filled 2019-05-14: qty 2

## 2019-05-14 MED ORDER — PROPOFOL 10 MG/ML IV BOLUS
INTRAVENOUS | Status: DC | PRN
  Administered 2019-05-14: 200 mg via INTRAVENOUS

## 2019-05-14 MED ORDER — LIDOCAINE 2% (20 MG/ML) 5 ML SYRINGE
INTRAMUSCULAR | Status: AC
  Filled 2019-05-14: qty 5

## 2019-05-14 MED ORDER — PROMETHAZINE HCL 25 MG/ML IJ SOLN
6.2500 mg | INTRAMUSCULAR | Status: DC | PRN
  Filled 2019-05-14: qty 1

## 2019-05-14 MED ORDER — LIDOCAINE 2% (20 MG/ML) 5 ML SYRINGE
INTRAMUSCULAR | Status: DC | PRN
  Administered 2019-05-14: 50 mg via INTRAVENOUS

## 2019-05-14 MED ORDER — OXYBUTYNIN CHLORIDE ER 10 MG PO TB24: 10.0000 mg | ORAL_TABLET | Freq: Every day | ORAL | 2 refills | Status: AC

## 2019-05-14 MED ORDER — CEFAZOLIN SODIUM-DEXTROSE 2-4 GM/100ML-% IV SOLN
2.0000 g | INTRAVENOUS | Status: AC
  Administered 2019-05-14: 2 g via INTRAVENOUS
  Filled 2019-05-14: qty 100

## 2019-05-14 MED ORDER — FENTANYL CITRATE (PF) 100 MCG/2ML IJ SOLN
INTRAMUSCULAR | Status: DC | PRN
  Administered 2019-05-14 (×2): 50 ug via INTRAVENOUS

## 2019-05-14 MED ORDER — PROPOFOL 10 MG/ML IV BOLUS
INTRAVENOUS | Status: AC
  Filled 2019-05-14: qty 20

## 2019-05-14 SURGICAL SUPPLY — 23 items
BAG URO CATCHER STRL LF (MISCELLANEOUS) ×4 IMPLANT
BASKET ZERO TIP NITINOL 2.4FR (BASKET) ×2 IMPLANT
BSKT STON RTRVL ZERO TP 2.4FR (BASKET) ×2
CATH URET 5FR 28IN OPEN ENDED (CATHETERS) ×4 IMPLANT
CLOTH BEACON ORANGE TIMEOUT ST (SAFETY) ×4 IMPLANT
DRSG TEGADERM 4X4.75 (GAUZE/BANDAGES/DRESSINGS) ×4 IMPLANT
EXTRACTOR STONE 1.7FRX115CM (UROLOGICAL SUPPLIES) IMPLANT
FIBER LASER TRAC TIP (UROLOGICAL SUPPLIES) ×2 IMPLANT
GLOVE BIO SURGEON STRL SZ 6.5 (GLOVE) ×3 IMPLANT
GLOVE BIO SURGEONS STRL SZ 6.5 (GLOVE) ×1
GOWN STRL REUS W/TWL XL LVL3 (GOWN DISPOSABLE) ×4 IMPLANT
GUIDEWIRE ANG ZIPWIRE 038X150 (WIRE) IMPLANT
GUIDEWIRE STR DUAL SENSOR (WIRE) ×6 IMPLANT
KIT TURNOVER CYSTO (KITS) ×2 IMPLANT
MANIFOLD NEPTUNE II (INSTRUMENTS) ×4 IMPLANT
PACK CYSTO (CUSTOM PROCEDURE TRAY) ×4 IMPLANT
SHEATH URETERAL 12FRX28CM (UROLOGICAL SUPPLIES) IMPLANT
SHEATH URETERAL 12FRX35CM (MISCELLANEOUS) ×2 IMPLANT
STENT URET 6FRX24 CONTOUR (STENTS) ×2 IMPLANT
TUBE CONNECTING 12'X1/4 (SUCTIONS) ×1
TUBE CONNECTING 12X1/4 (SUCTIONS) ×3 IMPLANT
TUBING UROLOGY SET (TUBING) ×4 IMPLANT
WIRE COONS/BENSON .038X145CM (WIRE) IMPLANT

## 2019-05-14 NOTE — Anesthesia Procedure Notes (Signed)
Procedure Name: LMA Insertion Date/Time: 05/14/2019 12:34 PM Performed by: Suan Halter, CRNA Pre-anesthesia Checklist: Patient identified, Emergency Drugs available, Suction available and Patient being monitored Patient Re-evaluated:Patient Re-evaluated prior to induction Oxygen Delivery Method: Circle system utilized Preoxygenation: Pre-oxygenation with 100% oxygen Induction Type: IV induction Ventilation: Mask ventilation without difficulty LMA: LMA inserted LMA Size: 4.0 Number of attempts: 1 Airway Equipment and Method: Bite block Placement Confirmation: positive ETCO2 Tube secured with: Tape Dental Injury: Teeth and Oropharynx as per pre-operative assessment

## 2019-05-14 NOTE — Anesthesia Preprocedure Evaluation (Signed)
Anesthesia Evaluation  Patient identified by MRN, date of birth, ID band Patient awake    Reviewed: Allergy & Precautions, NPO status , Unable to perform ROS - Chart review only  History of Anesthesia Complications Negative for: history of anesthetic complications  Airway Mallampati: II  TM Distance: >3 FB Neck ROM: Full    Dental  (+) Poor Dentition, Dental Advisory Given   Pulmonary Current Smoker and Patient abstained from smoking.,    Pulmonary exam normal        Cardiovascular hypertension, Normal cardiovascular exam     Neuro/Psych negative neurological ROS  negative psych ROS   GI/Hepatic negative GI ROS, Neg liver ROS,   Endo/Other    Renal/GU Renal InsufficiencyRenal disease     Musculoskeletal negative musculoskeletal ROS (+)   Abdominal   Peds  Hematology   Anesthesia Other Findings   Reproductive/Obstetrics                             Anesthesia Physical  Anesthesia Plan  ASA: II  Anesthesia Plan: General   Post-op Pain Management:    Induction: Intravenous  PONV Risk Score and Plan: 2 and Ondansetron, Midazolam and Treatment may vary due to age or medical condition  Airway Management Planned: LMA  Additional Equipment:   Intra-op Plan:   Post-operative Plan: Extubation in OR  Informed Consent: I have reviewed the patients History and Physical, chart, labs and discussed the procedure including the risks, benefits and alternatives for the proposed anesthesia with the patient or authorized representative who has indicated his/her understanding and acceptance.       Plan Discussed with:   Anesthesia Plan Comments:         Anesthesia Quick Evaluation

## 2019-05-14 NOTE — Interval H&P Note (Signed)
History and Physical Interval Note:  05/14/2019 12:20 PM  [name]  has presented today for surgery, with the diagnosis of RIGHT RENAL AND URETERAL STONES.  The various methods of treatment have been discussed with the patient and family. After consideration of risks, benefits and other options for treatment, the patient has consented to  Procedure(s) with comments: CYSTOSCOPY WITH RETROGRADE PYELOGRAM, URETEROSCOPY AND STENT PLACEMENT (Right) - 28 MINS HOLMIUM LASER APPLICATION (Right) as a surgical intervention.  The patient's history has been reviewed, patient examined, no change in status, stable for surgery.  I have reviewed the patient's chart and labs.  Questions were answered to the patient's satisfaction.     Pamela Horn D Tino Ronan

## 2019-05-14 NOTE — Op Note (Signed)
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  ANESTHESIA:  General  EBL:  Minimal  DRAINS: 6 [place] by 24 cm double-J ureteral stent  SPECIMEN: Right renal stones  FINDINGS:  1. Normal urethra 2. Normal cystoscopy 3. Significant UPJ edema and poor visualization 4. Removal of RIGHT PCN tube 5. Placement of 6Fr x 24cm JJ stent RIGHT  DESCRIPTION OF PROCEDURE: The patient was taken to the major OR and placed on the table. General anesthesia was administered and then the patient was moved to the dorsal lithotomy position. The genitalia was sterilely prepped and draped. An official timeout was performed.  Initially the 21 French cystoscope with 30 lens was passed under direct vision into the bladder. The bladder was then fully inspected. It was noted be free of any tumors, stones or inflammatory lesions. Ureteral orifices were of normal configuration and position. A 0.35 sensor wire was used to cannulate the ureteral orifice on the right-hand side and advanced to the kidney under fluoroscopic guidance.  A second wire was placed in similar fashion.  The cystoscope was removed.  Next a ureteral access sheath was then placed over the working wire and the inner sheath and wire were removed.  A flexible ureteroscope was then advanced through the access sheath until the UPJ stone was encountered.  There is significant edema and poor visualization.  The stone was partially covered with tissue.  A 200 m laser fiber was then used to fragment the stone.  The larger fragments were basketed with a 0 tip  basket. Reinspection of the ureter ureteroscopically revealed no further stone fragments and no injury to the ureter. I then backloaded the cystoscope over the guidewire and passed the stent over the guidewire into the area of the renal pelvis. As the guidewire was removed good curl was noted in the renal pelvis. The bladder was drained and the cystoscope was then removed. The patient tolerated the procedure well no intraoperative complications.  PLAN OF CARE: Discharge to home after PACU  PATIENT DISPOSITION:  PACU - hemodynamically stable.  Follow-up: The patient will come back to the office for discussion of repeat imaging and stage procedure.

## 2019-05-14 NOTE — Discharge Instructions (Signed)
Ureteral Stent Implantation, Care After °This sheet gives you information about how to care for yourself after your procedure. Your health care provider may also give you more specific instructions. If you have problems or questions, contact your health care provider. °What can I expect after the procedure? °After the procedure, it is common to have: °· Nausea. °· Mild pain when you urinate. You may feel this pain in your lower back or lower abdomen. The pain should stop within a few minutes after you urinate. This may last for up to 1 week. °· A small amount of blood in your urine for several days. °Follow these instructions at home: °Medicines °· Take over-the-counter and prescription medicines only as told by your health care provider. °· If you were prescribed an antibiotic medicine, take it as told by your health care provider. Do not stop taking the antibiotic even if you start to feel better. °· Do not drive for 24 hours if you were given a sedative during your procedure. °· Ask your health care provider if the medicine prescribed to you requires you to avoid driving or using heavy machinery. °Activity °· Rest as told by your health care provider. °· Avoid sitting for a long time without moving. Get up to take short walks every 1-2 hours. This is important to improve blood flow and breathing. Ask for help if you feel weak or unsteady. °· Return to your normal activities as told by your health care provider. Ask your health care provider what activities are safe for you. °General instructions ° °· Watch for any blood in your urine. Call your health care provider if the amount of blood in your urine increases. °· If you have a catheter: °? Follow instructions from your health care provider about taking care of your catheter and collection bag. °? Do not take baths, swim, or use a hot tub until your health care provider approves. Ask your health care provider if you may take showers. You may only be allowed to  take sponge baths. °· Drink enough fluid to keep your urine pale yellow. °· Do not use any products that contain nicotine or tobacco, such as cigarettes, e-cigarettes, and chewing tobacco. These can delay healing after surgery. If you need help quitting, ask your health care provider. °· Keep all follow-up visits as told by your health care provider. This is important. °Contact a health care provider if: °· You have pain that gets worse or does not get better with medicine, especially pain when you urinate. °· You have difficulty urinating. °· You feel nauseous or you vomit repeatedly during a period of more than 2 days after the procedure. °Get help right away if: °· Your urine is dark red or has blood clots in it. °· You are leaking urine (have incontinence). °· The end of the stent comes out of your urethra. °· You cannot urinate. °· You have sudden, sharp, or severe pain in your abdomen or lower back. °· You have a fever. °· You have swelling or pain in your legs. °· You have difficulty breathing. °Summary °· After the procedure, it is common to have mild pain when you urinate that goes away within a few minutes after you urinate. This may last for up to 1 week. °· Watch for any blood in your urine. Call your health care provider if the amount of blood in your urine increases. °· Take over-the-counter and prescription medicines only as told by your health care provider. °· Drink   enough fluid to keep your urine pale yellow. This information is not intended to replace advice given to you by your health care provider. Make sure you discuss any questions you have with your health care provider. Document Released: 02/27/2013 Document Revised: 04/03/2018 Document Reviewed: 04/04/2018 Elsevier Patient Education  2020 [employer].  Your nephrostomy tube was removed.  There is a stent going from you kidney to the bladder.  The opening in your back should seal in 48-72 hours.  You will have a follow up with Dr.  Claudia Desanctis in 1 week to discuss next steps for surgery.     Post Anesthesia Home Care Instructions  Activity: Get plenty of rest for the remainder of the day. A responsible individual must stay with you for 24 hours following the procedure.  For the next 24 hours, DO NOT: -Drive a car -Paediatric nurse -Drink alcoholic beverages -Take any medication unless instructed by your physician -Make any legal decisions or sign important papers.  Meals: Start with liquid foods such as gelatin or soup. Progress to regular foods as tolerated. Avoid greasy, spicy, heavy foods. If nausea and/or vomiting occur, drink only clear liquids until the nausea and/or vomiting subsides. Call your physician if vomiting continues.  Special Instructions/Symptoms: Your throat may feel dry or sore from the anesthesia or the breathing tube placed in your throat during surgery. If this causes discomfort, gargle with warm salt water. The discomfort should disappear within 24 hours.  If you had a scopolamine patch placed behind your ear for the management of post- operative nausea and/or vomiting:  1. The medication in the patch is effective for 72 hours, after which it should be removed.  Wrap patch in a tissue and discard in the trash. Wash hands thoroughly with soap and water. 2. You may remove the patch earlier than 72 hours if you experience unpleasant side effects which may include dry mouth, dizziness or visual disturbances. 3. Avoid touching the patch. Wash your hands with soap and water after contact with the patch.

## 2019-05-15 ENCOUNTER — Encounter (HOSPITAL_BASED_OUTPATIENT_CLINIC_OR_DEPARTMENT_OTHER): Payer: Self-pay | Admitting: Urology

## 2019-05-15 NOTE — Transfer of Care (Signed)
Immediate Anesthesia Transfer of Care Note  Patient: Pamela Horn  Procedure(s) Performed: Procedure(s) (LRB): CYSTOSCOPY WITH RETROGRADE PYELOGRAM, URETEROSCOPY AND STENT PLACEMENT (Right) HOLMIUM LASER APPLICATION (Right)  Patient Location: PACU  Anesthesia Type: General  Level of Consciousness: awake, oriented, sedated and patient cooperative  Airway & Oxygen Therapy: Patient Spontanous Breathing and Patient connected to face mask oxygen  Post-op Assessment: Report given to PACU RN and Post -op Vital signs reviewed and stable  Post vital signs: Reviewed and stable  Complications: No apparent anesthesia complications  Last Vitals:  Vitals Value Taken Time  BP 118/74 05/14/19 1512  Temp 36.7 C 05/14/19 1512  Pulse 58 05/14/19 1512  Resp 14 05/14/19 1512  SpO2 98 % 05/14/19 1512    Last Pain:  Vitals:   05/14/19 1512  TempSrc:   PainSc: 2       Patients Stated Pain Goal: 6 (05/14/19 1038)

## 2019-05-15 NOTE — Anesthesia Postprocedure Evaluation (Signed)
Anesthesia Post Note  Patient: [name]  Procedure(s) Performed: CYSTOSCOPY WITH RETROGRADE PYELOGRAM, URETEROSCOPY AND STENT PLACEMENT (Right Ureter) HOLMIUM LASER APPLICATION (Right )     Patient location during evaluation: PACU Anesthesia Type: General Level of consciousness: awake and alert Pain management: pain level controlled Vital Signs Assessment: post-procedure vital signs reviewed and stable Respiratory status: spontaneous breathing, nonlabored ventilation and respiratory function stable Cardiovascular status: blood pressure returned to baseline and stable Postop Assessment: no apparent nausea or vomiting Anesthetic complications: no    Last Vitals:  Vitals:   05/14/19 1440 05/14/19 1512  BP: 107/71 118/74  Pulse: 61 (!) 58  Resp: 13 14  Temp: 36.9 C 36.7 C  SpO2: 95% 98%    Last Pain:  Vitals:   05/15/19 1021  TempSrc:   PainSc: 0-No pain                 Lynda Rainwater

## 2019-05-15 NOTE — Addendum Note (Signed)
Addendum  created 05/15/19 1545 by Justice Rocher, CRNA   Charge Capture section accepted

## 2019-05-22 ENCOUNTER — Other Ambulatory Visit: Payer: Self-pay | Admitting: Urology

## 2019-05-22 ENCOUNTER — Other Ambulatory Visit (HOSPITAL_COMMUNITY): Payer: Self-pay | Admitting: Urology

## 2019-05-22 DIAGNOSIS — N200 Calculus of kidney: Secondary | ICD-10-CM

## 2019-05-22 LAB — CALCULI, WITH PHOTOGRAPH (CLINICAL LAB)
Carbonate Apatite: 40 %
Mg NH4 PO4 (Struvite): 60 %
Weight Calculi: 70 mg

## 2019-05-24 ENCOUNTER — Other Ambulatory Visit (HOSPITAL_COMMUNITY)
Admission: RE | Admit: 2019-05-24 | Discharge: 2019-05-24 | Disposition: A | Discharge status: Home / Self Care | Payer: Medicaid Other | Source: Ambulatory Visit | Attending: Urology | Admitting: Urology

## 2019-05-24 ENCOUNTER — Ambulatory Visit (HOSPITAL_COMMUNITY)
Admission: RE | Admit: 2019-05-24 | Discharge: 2019-05-24 | Disposition: A | Discharge status: Home / Self Care | Payer: Medicaid Other | Source: Ambulatory Visit | Attending: Urology | Admitting: Urology

## 2019-05-24 ENCOUNTER — Other Ambulatory Visit: Payer: Self-pay

## 2019-05-24 DIAGNOSIS — N200 Calculus of kidney: Secondary | ICD-10-CM | POA: Insufficient documentation

## 2019-05-24 NOTE — Patient Instructions (Signed)
DUE TO COVID-19 ONLY ONE VISITOR IS ALLOWED TO COME WITH YOU AND STAY IN THE WAITING ROOM ONLY DURING PRE OP AND PROCEDURE DAY OF SURGERY. THE 1 VISITOR MAY VISIT WITH YOU AFTER SURGERY IN YOUR PRIVATE ROOM DURING VISITING HOURS ONLY!  YOU NEED TO HAVE A COVID 19 TEST ON__11/13/20_____ @_______ , THIS TEST MUST BE DONE BEFORE SURGERY, COME  [place] , [id number].  ([place]) ONCE YOUR COVID TEST IS COMPLETED, PLEASE BEGIN THE QUARANTINE INSTRUCTIONS AS OUTLINED IN YOUR HANDOUT.                MAKALAH ASBERRY   Your procedure is scheduled on: 05/28/19   Report to [facility] Main  Entrance   Report to admitting at  10:00  AM     Call this number if you have problems the morning of surgery [phone]    Remember: Do not eat food or drink liquids :After Midnight.   BRUSH YOUR TEETH MORNING OF SURGERY AND RINSE YOUR MOUTH OUT, NO CHEWING GUM CANDY OR MINTS.     Take these medicines the morning of surgery with A SIP OF WATER: Ditropan, Buprenorphine                                You may not have any metal on your body including hair pins and              piercings  Do not wear jewelry, make-up, lotions, powders or perfumes, deodorant             Do not wear nail polish on your fingernails.  Do not shave  48 hours prior to surgery.     Do not bring valuables to the hospital. [place].  Contacts, dentures or bridgework may not be worn into surgery.       Patients discharged the day of surgery will not be allowed to drive home.     IF YOU ARE HAVING SURGERY AND GOING HOME THE SAME DAY, YOU MUST HAVE AN ADULT TO DRIVE YOU HOME AND BE WITH YOU FOR 24 HOURS.   YOU MAY GO HOME BY TAXI OR UBER OR ORTHERWISE, BUT AN ADULT MUST ACCOMPANY YOU HOME AND STAY WITH YOU FOR 24 HOURS.  Name and phone number of your driver:  Special Instructions: N/A              Please read over the following fact  sheets you were given: _____________________________________________________________________             [facility] - Preparing for Surgery  Before surgery, you can play an important role.   Because skin is not sterile, your skin needs to be as free of germs as possible.   You can reduce the number of germs on your skin by washing with CHG (chlorahexidine gluconate) soap before surgery .  CHG is an antiseptic cleaner which kills germs and bonds with the skin to continue killing germs even after washing. Please DO NOT use if you have an allergy to CHG or antibacterial soaps.   If your skin becomes reddened/irritated stop using the CHG and inform your nurse when you arrive at Short Stay. Do not shave (including legs and underarms) for at least 48 hours prior to the first CHG shower.  Please follow these instructions carefully:  1.  Shower with CHG Soap the night before surgery and the  morning of Surgery.  2.  If you choose to wash your hair, wash your hair first as usual with your  normal  shampoo.  3.  After you shampoo, rinse your hair and body thoroughly to remove the  shampoo.                                        4.  Use CHG as you would any other liquid soap.  You can apply chg directly  to the skin and wash                       Gently with a scrungie or clean washcloth.  5.  Apply the CHG Soap to your body ONLY FROM THE NECK DOWN.   Do not use on face/ open                           Wound or open sores. Avoid contact with eyes, ears mouth and genitals (private parts).                       Wash face,  Genitals (private parts) with your normal soap.             6.  Wash thoroughly, paying special attention to the area where your surgery  will be performed.  7.  Thoroughly rinse your body with warm water from the neck down.  8.  DO NOT shower/wash with your normal soap after using and rinsing off  the CHG Soap.             9.  Pat yourself dry with a clean towel.            10.   Wear clean pajamas.            11.  Place clean sheets on your bed the night of your first shower and do not  sleep with pets. Day of Surgery : Do not apply any lotions/deodorants the morning of surgery.  Please wear clean clothes to the hospital/surgery center.  FAILURE TO FOLLOW THESE INSTRUCTIONS MAY RESULT IN THE CANCELLATION OF YOUR SURGERY PATIENT SIGNATURE_________________________________  NURSE SIGNATURE__________________________________  ________________________________________________________________________

## 2019-05-27 ENCOUNTER — Encounter (HOSPITAL_COMMUNITY): Payer: Self-pay

## 2019-05-27 ENCOUNTER — Other Ambulatory Visit: Payer: Self-pay

## 2019-05-27 ENCOUNTER — Other Ambulatory Visit (HOSPITAL_COMMUNITY)
Admission: RE | Admit: 2019-05-27 | Discharge: 2019-05-27 | Disposition: A | Discharge status: Home / Self Care | Payer: Medicaid Other | Source: Ambulatory Visit | Attending: Urology | Admitting: Urology

## 2019-05-27 ENCOUNTER — Encounter (HOSPITAL_COMMUNITY)
Admission: RE | Admit: 2019-05-27 | Discharge: 2019-05-27 | Disposition: A | Discharge status: Home / Self Care | Payer: Medicaid Other | Source: Ambulatory Visit | Attending: Urology | Admitting: Urology

## 2019-05-27 DIAGNOSIS — Z20828 Contact with and (suspected) exposure to other viral communicable diseases: Secondary | ICD-10-CM | POA: Diagnosis not present

## 2019-05-27 DIAGNOSIS — Z01812 Encounter for preprocedural laboratory examination: Secondary | ICD-10-CM | POA: Diagnosis present

## 2019-05-27 LAB — COMPREHENSIVE METABOLIC PANEL
ALT: 8 U/L (ref 0–44)
AST: 11 U/L — ABNORMAL LOW (ref 15–41)
Albumin: 3.7 g/dL (ref 3.5–5.0)
Alkaline Phosphatase: 54 U/L (ref 38–126)
Anion gap: 7 (ref 5–15)
BUN: 17 mg/dL (ref 6–20)
CO2: 24 mmol/L (ref 22–32)
Calcium: 8.9 mg/dL (ref 8.9–10.3)
Chloride: 108 mmol/L (ref 98–111)
Creatinine, Ser: 1.09 mg/dL — ABNORMAL HIGH (ref 0.44–1.00)
GFR calc Af Amer: >60 mL/min (ref >60)
GFR calc non Af Amer: >60 mL/min (ref >60)
Glucose, Bld: 100 mg/dL — ABNORMAL HIGH (ref 70–99)
Potassium: 5.1 mmol/L (ref 3.5–5.1)
Sodium: 139 mmol/L (ref 135–145)
Total Bilirubin: 0.5 mg/dL (ref 0.3–1.2)
Total Protein: 7 g/dL (ref 6.5–8.1)

## 2019-05-27 LAB — SURGICAL PCR SCREEN
MRSA, PCR: NEGATIVE
Staphylococcus aureus: NEGATIVE

## 2019-05-27 LAB — CBC
HCT: 40.2 % (ref 36.0–46.0)
Hemoglobin: 12.1 g/dL (ref 12.0–15.0)
MCH: 25.8 pg — ABNORMAL LOW (ref 26.0–34.0)
MCHC: 30.1 g/dL (ref 30.0–36.0)
MCV: 85.7 fL (ref 80.0–100.0)
Platelets: 242 10*3/uL (ref 150–400)
RBC: 4.69 MIL/uL (ref 3.87–5.11)
RDW: 15.2 % (ref 11.5–15.5)
WBC: 5.1 10*3/uL (ref 4.0–10.5)
nRBC: 0 % (ref 0.0–0.2)

## 2019-05-27 LAB — SARS CORONAVIRUS 2 (TAT 6-24 HRS): SARS Coronavirus 2: NEGATIVE

## 2019-05-27 NOTE — Progress Notes (Signed)
PCP - Dr. Charlean Merl Cardiologist - none  Chest x-ray - no EKG - no Stress Test - no ECHO - no Cardiac Cath - no  Sleep Study - no CPAP -   Fasting Blood Sugar - NA Checks Blood Sugar _____ times a day  Blood Thinner Instructions: NA Aspirin Instructions: Last Dose:  Anesthesia review:   Patient denies shortness of breath, fever, cough and chest pain at PAT appointment yes  Patient verbalized understanding of instructions that were given to them at the PAT appointment. Patient was also instructed that they will need to review over the PAT instructions again at home before surgery. Yes Patient confused the time to take the covid test on 11/13. She waited for her PAT visit to ask about when to take it. I called Photographer at covid station to ask what to do. She said send her over for testing now.

## 2019-05-28 ENCOUNTER — Ambulatory Visit (HOSPITAL_COMMUNITY): Payer: Medicaid Other | Admitting: Anesthesiology

## 2019-05-28 ENCOUNTER — Other Ambulatory Visit: Payer: Self-pay

## 2019-05-28 ENCOUNTER — Ambulatory Visit (HOSPITAL_COMMUNITY): Payer: Medicaid Other

## 2019-05-28 ENCOUNTER — Ambulatory Visit (HOSPITAL_COMMUNITY): Payer: Medicaid Other | Admitting: Physician Assistant

## 2019-05-28 ENCOUNTER — Encounter (HOSPITAL_COMMUNITY): Payer: Self-pay

## 2019-05-28 ENCOUNTER — Ambulatory Visit (HOSPITAL_COMMUNITY)
Admission: RE | Admit: 2019-05-28 | Discharge: 2019-05-28 | Disposition: A | Discharge status: Home / Self Care | Payer: Medicaid Other | Attending: Urology | Admitting: Urology

## 2019-05-28 ENCOUNTER — Encounter (HOSPITAL_COMMUNITY)
Admission: RE | Disposition: A | Discharge status: Home / Self Care | Payer: Self-pay | Source: Home / Self Care | Attending: Urology

## 2019-05-28 DIAGNOSIS — N202 Calculus of kidney with calculus of ureter: Secondary | ICD-10-CM | POA: Diagnosis present

## 2019-05-28 DIAGNOSIS — N132 Hydronephrosis with renal and ureteral calculous obstruction: Secondary | ICD-10-CM | POA: Insufficient documentation

## 2019-05-28 DIAGNOSIS — Z833 Family history of diabetes mellitus: Secondary | ICD-10-CM | POA: Insufficient documentation

## 2019-05-28 DIAGNOSIS — Z808 Family history of malignant neoplasm of other organs or systems: Secondary | ICD-10-CM | POA: Insufficient documentation

## 2019-05-28 DIAGNOSIS — Z881 Allergy status to other antibiotic agents status: Secondary | ICD-10-CM | POA: Diagnosis not present

## 2019-05-28 DIAGNOSIS — Z79899 Other long term (current) drug therapy: Secondary | ICD-10-CM | POA: Diagnosis not present

## 2019-05-28 DIAGNOSIS — I10 Essential (primary) hypertension: Secondary | ICD-10-CM | POA: Diagnosis not present

## 2019-05-28 DIAGNOSIS — F17200 Nicotine dependence, unspecified, uncomplicated: Secondary | ICD-10-CM | POA: Insufficient documentation

## 2019-05-28 HISTORY — PX: CYSTOSCOPY/URETEROSCOPY/HOLMIUM LASER/STENT PLACEMENT: SHX6546

## 2019-05-28 LAB — PREGNANCY, URINE: Preg Test, Ur: NEGATIVE

## 2019-05-28 SURGERY — CYSTOSCOPY/URETEROSCOPY/HOLMIUM LASER/STENT PLACEMENT
Anesthesia: General | Site: Ureter | Laterality: Right

## 2019-05-28 MED ORDER — CEFAZOLIN SODIUM-DEXTROSE 2-4 GM/100ML-% IV SOLN
2.0000 g | INTRAVENOUS | Status: AC
  Administered 2019-05-28: 2 g via INTRAVENOUS
  Filled 2019-05-28: qty 100

## 2019-05-28 MED ORDER — SODIUM CHLORIDE 0.9 % IR SOLN
Status: DC | PRN
  Administered 2019-05-28: 1000 mL

## 2019-05-28 MED ORDER — LIDOCAINE 2% (20 MG/ML) 5 ML SYRINGE
INTRAMUSCULAR | Status: DC | PRN
  Administered 2019-05-28: 50 mg via INTRAVENOUS

## 2019-05-28 MED ORDER — SODIUM CHLORIDE 0.9 % IR SOLN
Status: DC | PRN
  Administered 2019-05-28: 3000 mL

## 2019-05-28 MED ORDER — MEPERIDINE HCL 50 MG/ML IJ SOLN: 6.2500 mg | INTRAMUSCULAR | Status: DC | PRN

## 2019-05-28 MED ORDER — LACTATED RINGERS IV SOLN: INTRAVENOUS | Status: DC

## 2019-05-28 MED ORDER — ACETAMINOPHEN 160 MG/5ML PO SOLN: 325.0000 mg | Freq: Once | ORAL | Status: DC | PRN

## 2019-05-28 MED ORDER — ACETAMINOPHEN 10 MG/ML IV SOLN: 1000.0000 mg | Freq: Once | INTRAVENOUS | Status: DC | PRN

## 2019-05-28 MED ORDER — DEXAMETHASONE SODIUM PHOSPHATE 10 MG/ML IJ SOLN
INTRAMUSCULAR | Status: DC | PRN
  Administered 2019-05-28: 8 mg via INTRAVENOUS

## 2019-05-28 MED ORDER — PROMETHAZINE HCL 25 MG/ML IJ SOLN: 6.2500 mg | INTRAMUSCULAR | Status: DC | PRN

## 2019-05-28 MED ORDER — PROPOFOL 10 MG/ML IV BOLUS
INTRAVENOUS | Status: DC | PRN
  Administered 2019-05-28: 200 mg via INTRAVENOUS

## 2019-05-28 MED ORDER — ACETAMINOPHEN 325 MG PO TABS: 325.0000 mg | ORAL_TABLET | Freq: Once | ORAL | Status: DC | PRN

## 2019-05-28 MED ORDER — ONDANSETRON HCL 4 MG/2ML IJ SOLN
INTRAMUSCULAR | Status: DC | PRN
  Administered 2019-05-28: 4 mg via INTRAVENOUS

## 2019-05-28 MED ORDER — PROPOFOL 10 MG/ML IV BOLUS
INTRAVENOUS | Status: AC
  Filled 2019-05-28: qty 20

## 2019-05-28 MED ORDER — FENTANYL CITRATE (PF) 100 MCG/2ML IJ SOLN: 25.0000 ug | INTRAMUSCULAR | Status: DC | PRN

## 2019-05-28 MED ORDER — IOHEXOL 300 MG/ML  SOLN
INTRAMUSCULAR | Status: DC | PRN
  Administered 2019-05-28: 50 mL

## 2019-05-28 MED ORDER — MIDAZOLAM HCL 5 MG/5ML IJ SOLN
INTRAMUSCULAR | Status: DC | PRN
  Administered 2019-05-28: 2 mg via INTRAVENOUS

## 2019-05-28 MED ORDER — FENTANYL CITRATE (PF) 100 MCG/2ML IJ SOLN
INTRAMUSCULAR | Status: AC
  Filled 2019-05-28: qty 2

## 2019-05-28 MED ORDER — LACTATED RINGERS IV SOLN
INTRAVENOUS | Status: DC
  Administered 2019-05-28: 11:00:00 via INTRAVENOUS

## 2019-05-28 MED ORDER — FENTANYL CITRATE (PF) 100 MCG/2ML IJ SOLN
INTRAMUSCULAR | Status: DC | PRN
  Administered 2019-05-28 (×2): 25 ug via INTRAVENOUS

## 2019-05-28 MED ORDER — MIDAZOLAM HCL 2 MG/2ML IJ SOLN
INTRAMUSCULAR | Status: AC
  Filled 2019-05-28: qty 2

## 2019-05-28 SURGICAL SUPPLY — 23 items
BAG URO CATCHER STRL LF (MISCELLANEOUS) ×3 IMPLANT
BASKET ZERO TIP NITINOL 2.4FR (BASKET) ×2 IMPLANT
BSKT STON RTRVL ZERO TP 2.4FR (BASKET) ×1
CATH URET 5FR 28IN OPEN ENDED (CATHETERS) ×3 IMPLANT
CLOTH BEACON ORANGE TIMEOUT ST (SAFETY) ×3 IMPLANT
DRSG TEGADERM 2-3/8X2-3/4 SM (GAUZE/BANDAGES/DRESSINGS) ×3 IMPLANT
EXTRACTOR STONE 1.7FRX115CM (UROLOGICAL SUPPLIES) IMPLANT
FIBER LASER TRAC TIP (UROLOGICAL SUPPLIES) ×2 IMPLANT
GLOVE BIO SURGEON STRL SZ 6.5 (GLOVE) ×2 IMPLANT
GLOVE BIO SURGEONS STRL SZ 6.5 (GLOVE) ×1
GOWN STRL REUS W/TWL LRG LVL3 (GOWN DISPOSABLE) ×3 IMPLANT
GUIDEWIRE ANG ZIPWIRE 038X150 (WIRE) IMPLANT
GUIDEWIRE STR DUAL SENSOR (WIRE) ×3 IMPLANT
KIT TURNOVER KIT A (KITS) IMPLANT
MANIFOLD NEPTUNE II (INSTRUMENTS) ×3 IMPLANT
PACK CYSTO (CUSTOM PROCEDURE TRAY) ×3 IMPLANT
SHEATH URETERAL 12FRX28CM (UROLOGICAL SUPPLIES) IMPLANT
SHEATH URETERAL 12FRX35CM (MISCELLANEOUS) IMPLANT
STENT URET 6FRX24 CONTOUR (STENTS) ×2 IMPLANT
TUBING CONNECTING 10 (TUBING) ×2 IMPLANT
TUBING CONNECTING 10' (TUBING) ×1
TUBING UROLOGY SET (TUBING) ×3 IMPLANT
WIRE COONS/BENSON .038X145CM (WIRE) IMPLANT

## 2019-05-28 NOTE — Transfer of Care (Signed)
Immediate Anesthesia Transfer of Care Note  Patient: Pamela Horn  Procedure(s) Performed: Procedure(s) with comments: CYSTOSCOPY/URETEROSCOPY/HOLMIUM LASER/STENT PLACEMENT (Right) - 30 MINS  Patient Location: PACU  Anesthesia Type:General  Level of Consciousness:  sedated, patient cooperative and responds to stimulation  Airway & Oxygen Therapy:Patient Spontanous Breathing and Patient connected to face mask oxgen  Post-op Assessment:  Report given to PACU RN and Post -op Vital signs reviewed and stable  Post vital signs:  Reviewed and stable  Last Vitals:  Vitals:   05/28/19 1035  BP: 98/75  Pulse: 64  Resp: 16  Temp: 36.7 C  SpO2: 84%    Complications: No apparent anesthesia complications

## 2019-05-28 NOTE — Anesthesia Postprocedure Evaluation (Signed)
Anesthesia Post Note  Patient: Pamela Horn  Procedure(s) Performed: CYSTOSCOPY/URETEROSCOPY/HOLMIUM LASER/STENT PLACEMENT (Right Ureter)     Patient location during evaluation: PACU Anesthesia Type: General Level of consciousness: awake and alert Pain management: pain level controlled Vital Signs Assessment: post-procedure vital signs reviewed and stable Respiratory status: spontaneous breathing, nonlabored ventilation, respiratory function stable and patient connected to nasal cannula oxygen Cardiovascular status: blood pressure returned to baseline and stable Postop Assessment: no apparent nausea or vomiting Anesthetic complications: no    Last Vitals:  Vitals:   05/28/19 1345 05/28/19 1400  BP: 113/74 118/90  Pulse: (!) 49 (!) 48  Resp: 15 10  Temp:    SpO2: 96% 100%    Last Pain:  Vitals:   05/28/19 1345  TempSrc:   PainSc: 0-No pain                 [name]

## 2019-05-28 NOTE — Anesthesia Procedure Notes (Signed)
Procedure Name: LMA Insertion Date/Time: 05/28/2019 12:06 PM Performed by: Anne Fu, CRNA Pre-anesthesia Checklist: Patient identified, Emergency Drugs available, Suction available, Patient being monitored and Timeout performed Patient Re-evaluated:Patient Re-evaluated prior to induction Oxygen Delivery Method: Circle system utilized Preoxygenation: Pre-oxygenation with 100% oxygen Induction Type: IV induction Ventilation: Mask ventilation without difficulty LMA: LMA inserted LMA Size: 4.0 Number of attempts: 1 Placement Confirmation: positive ETCO2 and breath sounds checked- equal and bilateral Tube secured with: Tape

## 2019-05-28 NOTE — H&P (Signed)
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 Patient returns to the office for repeat imaging and scheduling of second-stage ureteroscopy to clear up the rest of the kidney. She has been doing great since surgery. The PCN trach is healed and she is not leaking from her flank. She is pain free. She does have some mild urinary frequency/urgency with a stent. No fevers, chills, nausea or vomiting.   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 She underwent a RIGHT PCN placement and was discharged from the hospital. She has been doing well with the nephrostomy tube but does have a moderate amount of drainage around the tube. She is completing her last day of antibiotics today. She denies fever, chills or emesis. This is patient's 1st stone episode. She does have a use of opioid addiction is on Suboxone and managing her pain with Tylenol and Motrin.     ALLERGIES: Bactrim    MEDICATIONS: Cephalexin 500 mg capsule  Suboxone     GU PSH: Ureteroscopic laser litho, Right - 05/14/2019     NON-GU PSH: Cesarean Delivery     GU PMH: Renal and ureteral calculus - 05/03/2019    NON-GU PMH: None   FAMILY HISTORY: Brain Cancer - Father Diabetes - Uncle, Aunt skin cancer - Sister   SOCIAL HISTORY: Marital Status: Single Preferred Language: English; Ethnicity: Not Hispanic Or Latino; Race: White Current Smoking Status: Patient smokes.   Tobacco Use Assessment Completed: Used Tobacco in last 30 days? Has never drank.  Does not drink caffeine.    REVIEW OF SYSTEMS:    GU Review  Female:   Patient reports get up at night to urinate and leakage of urine. Patient denies frequent urination, hard to postpone urination, burning /pain with urination, stream starts and stops, trouble starting your stream, have to strain to urinate, and being pregnant.  Gastrointestinal (Upper):   Patient denies nausea, vomiting, and indigestion/ heartburn.  Gastrointestinal (Lower):   Patient denies diarrhea and constipation.  Constitutional:   Patient denies fever, night sweats, weight loss, and fatigue.  Skin:   Patient denies skin rash/ lesion and itching.  Eyes:   Patient denies blurred vision and double vision.  Ears/ Nose/ Throat:   Patient denies sore throat and sinus problems.  Hematologic/Lymphatic:   Patient denies swollen glands and easy bruising.  Cardiovascular:   Patient denies leg swelling and chest pains.  Respiratory:   Patient denies cough and shortness of breath.  Endocrine:   Patient denies excessive thirst.  Musculoskeletal:   Patient denies back pain and joint pain.  Neurological:   Patient denies headaches and dizziness.  Psychologic:   Patient denies anxiety and depression.   VITAL SIGNS:      05/21/2019 02:59 PM  Weight 171 lb / 77.56 kg  Height 62 in / 157.48 cm  BP 113/75 mmHg  Pulse 58 /min  BMI 31.3 kg/m   MULTI-SYSTEM PHYSICAL EXAMINATION:    Constitutional: Well-nourished. No physical deformities. Normally developed. Good grooming.  Neck: Neck symmetrical, not swollen. Normal tracheal position.  Respiratory: No labored breathing, no use of accessory muscles.   Cardiovascular: Normal temperature,  normal extremity pulses, no swelling, no varicosities.  Lymphatic: No enlargement of neck, axillae, groin.  Skin: No paleness, no jaundice, no cyanosis. No lesion, no ulcer, no rash.  Neurologic / Psychiatric: Oriented to time, oriented to place, oriented to person. No depression, no anxiety, no agitation.  Gastrointestinal: No mass, no tenderness, no rigidity,  non obese abdomen.  Eyes: Normal conjunctivae. Normal eyelids.  Ears, Nose, Mouth, and Throat: Left ear no scars, no lesions, no masses. Right ear no scars, no lesions, no masses. Nose no scars, no lesions, no masses. Normal hearing. Normal lips.  Musculoskeletal: Normal gait and station of head and neck. Right flank examined and PCN tract has scabbed over and is not leaking. There is no erythema or tenderness.     PAST DATA REVIEWED:  Source Of History:  Patient  Urine Test Review:   Urinalysis   PROCEDURES:          Urinalysis w/Scope Dipstick Dipstick Cont'd Micro  Color: Yellow Bilirubin: Neg mg/dL WBC/hpf: 40 - 27/XAJ  Appearance: Slightly Cloudy Ketones: Neg mg/dL RBC/hpf: 3 - 28/NOM  Specific Gravity: 1.015 Blood: 1+ ery/uL Bacteria: Rare (0-9/hpf)  pH: 7.0 Protein: 1+ mg/dL Cystals: Amorph Phosphates  Glucose: Neg mg/dL Urobilinogen: 0.2 mg/dL Casts: NS (Not Seen)    Nitrites: Neg Trichomonas: Not Present    Leukocyte Esterase: 3+ leu/uL Mucous: Not Present      Epithelial Cells: 0 - 5/hpf      Yeast: NS (Not Seen)      Sperm: Not Present    ASSESSMENT:      ICD-10 Details  1 GU:   Renal calculus - N20.0 Will repeat CT scan this week prior to second-stage ureteroscopy. The risks and benefits discussed with the patient of the same as last surgery including bleeding, infection, pain, stent discomfort, inability remove all the stones, damage to surrounding structures, ureteral stricture. Will send urine for culture today. Will call patient with surgery date.   PLAN:           Orders Labs CULTURE, URINE  X-Rays: C.T. Abdomen/Pelvis Without Contrast          Schedule         Document Letter(s):  Created for Patient: Clinical Summary         Next Appointment:      Next Appointment: 05/28/2019 12:00 PM    Appointment Type: Surgery     Location: [facility] [phone]    Provider: Kasandra Knudsen, M.D.    Reason for Visit: WL/OP CYSTO, (R)  URETEROSCOPY, LASER LITHO, STENT EXCHANGE

## 2019-05-28 NOTE — Anesthesia Preprocedure Evaluation (Addendum)
Anesthesia Evaluation  Patient identified by MRN, date of birth, ID band Patient awake    Reviewed: Allergy & Precautions, NPO status , Patient's Chart, lab work & pertinent test results  Airway Mallampati: II  TM Distance: >3 FB Neck ROM: Full    Dental  (+) Poor Dentition   Pulmonary Current Smoker,    breath sounds clear to auscultation       Cardiovascular hypertension,  Rhythm:Regular Rate:Normal     Neuro/Psych negative neurological ROS  negative psych ROS   GI/Hepatic negative GI ROS, Neg liver ROS,   Endo/Other  negative endocrine ROS  Renal/GU      Musculoskeletal   Abdominal Normal abdominal exam  (+)   Peds  Hematology   Anesthesia Other Findings   Reproductive/Obstetrics                            Lab Results  Component Value Date   WBC 5.1 05/27/2019   HGB 12.1 05/27/2019   HCT 40.2 05/27/2019   MCV 85.7 05/27/2019   PLT 242 05/27/2019   Lab Results  Component Value Date   CREATININE 1.09 (H) 05/27/2019   BUN 17 05/27/2019   NA 139 05/27/2019   K 5.1 05/27/2019   CL 108 05/27/2019   CO2 24 05/27/2019   Lab Results  Component Value Date   INR 1.3 (H) 04/20/2019     Anesthesia Physical Anesthesia Plan  ASA: II  Anesthesia Plan: General   Post-op Pain Management:    Induction: Intravenous  PONV Risk Score and Plan: 3 and Ondansetron, Dexamethasone and Midazolam  Airway Management Planned: LMA  Additional Equipment: None  Intra-op Plan:   Post-operative Plan:   Informed Consent: I have reviewed the patients History and Physical, chart, labs and discussed the procedure including the risks, benefits and alternatives for the proposed anesthesia with the patient or authorized representative who has indicated his/her understanding and acceptance.       Plan Discussed with: CRNA  Anesthesia Plan Comments:        Anesthesia Quick Evaluation

## 2019-05-28 NOTE — Interval H&P Note (Signed)
History and Physical Interval Note:  05/28/2019 11:12 AM  [place] Audie Box  has presented today for surgery, with the diagnosis of RIGHT RENAL CALCULI.  The various methods of treatment have been discussed with the patient and family. After consideration of risks, benefits and other options for treatment, the patient has consented to  Procedure(s) with comments: CYSTOSCOPY/URETEROSCOPY/HOLMIUM LASER/STENT PLACEMENT (Right) - 90 MINS as a surgical intervention.  The patient's history has been reviewed, patient examined, no change in status, stable for surgery.  I have reviewed the patient's chart and labs.  Questions were answered to the patient's satisfaction.     Shonn Farruggia D Tenelle Andreason

## 2019-05-28 NOTE — Op Note (Signed)
PATIENT:  Pamela Horn  PRE-OPERATIVE DIAGNOSIS:  Right renal calculi  POST-OPERATIVE DIAGNOSIS: Same  PROCEDURE:  1. Cystoscopy, right flexible ureteroscopy with laser lithotripsy and right ureteral stent exchange  SURGEON: Jacalyn Lefevre, MD  INDICATION: 38 year old woman who initially presented with emphysematous pyelitis and to obstructing ureteral calculi returns to the OR for staged procedure and to evaluate stone burden.  ANESTHESIA:  General  EBL:  Minimal  DRAINS: 6 [place] by 24 cm JJ stent right  SPECIMEN: Renal calculi  FINDINGS: 1.  Normal urethra 2.  Normal bladder mucosa without masses or stones 3.  Retrograde pyelogram through the ureteroscope showed dilated right upper pole consistent with long-term obstruction and difficulty accessing lower pole 4.  Stone in the midpole fragmented and larger pieces were basketed with a 0 tip  DESCRIPTION OF PROCEDURE: The patient was taken to the major OR and placed on the table. General anesthesia was administered and then the patient was moved to the dorsal lithotomy position. The genitalia was sterilely prepped and draped. An official timeout was performed.  Initially the 95 French cystoscope with 30 lens was passed under direct vision into the bladder. The bladder was then fully inspected. It was noted be free of any tumors, stones or inflammatory lesions. Ureteral orifices were of normal configuration and position.  General stent was grabbed with a grasper and brought to the urethral meatus.  A sensor wire was then advanced through the ureteral stent up to the kidney with fluoroscopic guidance.  The stent was subsequently removed.  A second wire was then placed this is cystoscope up to the kidney on fluoroscopic guidance.  One wire was secured as the safety wire.  A ureteral access sheath was then advanced over the working wire and the inner sheath and wire removed.  A flexible ureteroscope was advanced to the access sheath into  the kidney.  There is a stone seen at mid calyx which was fragmented with the 200 m holmium laser fiber.  Further inspection of the renal calyces revealed a blown out upper pole and difficulty accessing the rest of the stone burden seen in the lower pole/questioning skilled left lower pole.  A 0 tip basket was used to extract all of the stone fragments and reinspection of the ureter ureteroscopically revealed no further stone fragments and no injury to the ureter. I then backloaded the cystoscope over the guidewire and passed the stent over the guidewire into the area of the renal pelvis. As the guidewire was removed good curl was noted in the renal pelvis. The bladder was drained and the cystoscope was then removed. The patient tolerated the procedure well no intraoperative complications.  PLAN OF CARE: Discharge to home after PACU  PATIENT DISPOSITION:  PACU - hemodynamically stable.

## 2019-05-28 NOTE — Discharge Instructions (Signed)
Ureteral Stent Implantation, Care After °This sheet gives you information about how to care for yourself after your procedure. Your health care provider may also give you more specific instructions. If you have problems or questions, contact your health care provider. °What can I expect after the procedure? °After the procedure, it is common to have: °· Nausea. °· Mild pain when you urinate. You may feel this pain in your lower back or lower abdomen. The pain should stop within a few minutes after you urinate. This may last for up to 1 week. °· A small amount of blood in your urine for several days. °Follow these instructions at home: °Medicines °· Take over-the-counter and prescription medicines only as told by your health care provider. °· If you were prescribed an antibiotic medicine, take it as told by your health care provider. Do not stop taking the antibiotic even if you start to feel better. °· Do not drive for 24 hours if you were given a sedative during your procedure. °· Ask your health care provider if the medicine prescribed to you requires you to avoid driving or using heavy machinery. °Activity °· Rest as told by your health care provider. °· Avoid sitting for a long time without moving. Get up to take short walks every 1-2 hours. This is important to improve blood flow and breathing. Ask for help if you feel weak or unsteady. °· Return to your normal activities as told by your health care provider. Ask your health care provider what activities are safe for you. °General instructions ° °· Watch for any blood in your urine. Call your health care provider if the amount of blood in your urine increases. °· If you have a catheter: °? Follow instructions from your health care provider about taking care of your catheter and collection bag. °? Do not take baths, swim, or use a hot tub until your health care provider approves. Ask your health care provider if you may take showers. You may only be allowed to  take sponge baths. °· Drink enough fluid to keep your urine pale yellow. °· Do not use any products that contain nicotine or tobacco, such as cigarettes, e-cigarettes, and chewing tobacco. These can delay healing after surgery. If you need help quitting, ask your health care provider. °· Keep all follow-up visits as told by your health care provider. This is important. °Contact a health care provider if: °· You have pain that gets worse or does not get better with medicine, especially pain when you urinate. °· You have difficulty urinating. °· You feel nauseous or you vomit repeatedly during a period of more than 2 days after the procedure. °Get help right away if: °· Your urine is dark red or has blood clots in it. °· You are leaking urine (have incontinence). °· The end of the stent comes out of your urethra. °· You cannot urinate. °· You have sudden, sharp, or severe pain in your abdomen or lower back. °· You have a fever. °· You have swelling or pain in your legs. °· You have difficulty breathing. °Summary °· After the procedure, it is common to have mild pain when you urinate that goes away within a few minutes after you urinate. This may last for up to 1 week. °· Watch for any blood in your urine. Call your health care provider if the amount of blood in your urine increases. °· Take over-the-counter and prescription medicines only as told by your health care provider. °· Drink   enough fluid to keep your urine pale yellow. This information is not intended to replace advice given to you by your health care provider. Make sure you discuss any questions you have with your health care provider. Document Released: 02/27/2013 Document Revised: 04/03/2018 Document Reviewed: 04/04/2018 Elsevier Patient Education  2020 [place] stone removal/stent placement surgery instructions   Definitions:  Ureter: The duct that transports urine from the kidney to the bladder. Stent: A plastic hollow tube that  is placed into the ureter, from the kidney to the bladder to prevent the ureter from swelling shut.  General instructions:  Despite the fact that no skin incisions were used, the area around the ureter and bladder is raw and irritated. The stent is a foreign body which will further irritate the bladder wall. This irritation is manifested by increased frequency of urination, both day and night, and by an increase in the urge to urinate. In some, the urge to urinate is present almost always. Sometimes the urge is strong enough that you may not be able to stop your self from urinating. The only real cure is to remove the stent and then give time for the bladder wall to heal which can't be done until the danger of the ureter swelling shut has passed. (This varies from 2-21 days).  You may see some blood in your urine while the stent is in place and a few days afterward. Do not be alarmed, even if the urine is clear for a while. Get off your feet and drink lots of fluids until clearing occurs. If you start to pass clots or don't improve, call us.  If you have a string coming from your urethra:  The stent string is attached to your ureteral stent.  Do not pull on thisIf you have a string coming from your urethra:  The stent string is attached to your ureteral stent.  Do not pull on this.  Diet:  You may return to your normal diet immediately. Because of the raw surface of your bladder, alcohol, spicy foods, foods high in acid and drinks with caffeine may cause irritation or frequency and should be used in moderation. To keep your urine flowing freely and avoid constipation, drink plenty of fluids during the day (8-10 glasses). Tip: Avoid cranberry juice because it is very acidic.  Activity:  Your physical activity doesn't need to be restricted. However, if you are very active, you may see some blood in the urine. We suggest that you reduce your activity under the circumstances until the bleeding has  stopped.  Bowels:  It is important to keep your bowels regular during the postoperative period. Straining with bowel movements can cause bleeding. A bowel movement every other day is reasonable. Use a mild laxative if needed, such as milk of magnesia 2-3 tablespoons, or 2 Dulcolax tablets. Call if you continue to have problems. If you had been taking narcotics for pain, before, during or after your surgery, you may be constipated. Take a laxative if necessary.     Medication:  You should resume your pre-surgery medications unless told not to. DO NOT RESUME YOUR ASPIRIN, or any other medicines like ibuprofen, motrin, excedrin, advil, aleve, vitamin E, fish oil as these can all cause bleeding x 7 days. In addition you may be given an antibiotic to prevent or treat infection. Antibiotics are not always necessary. All medication should be taken as prescribed until the bottles are finished unless you are having an unusual reaction  to one of the drugs.  Problems you should report to [place]:  a. Fever greater than 101F. b. Heavy bleeding, or clots (see notes above about blood in urine). c. Inability to urinate. d. Drug reactions (hives, rash, nausea, vomiting, diarrhea). e. Severe burning or pain with urination that is not improving.  Followup: You may remove your stent on Thursday, November 19.  You can do this by gently pulling on the string until the entire stent is removed.  Please take 1 dose of antibiotics prior to stent removal.  If you need assistance please call [facility] for a nurse visit.

## 2019-05-29 ENCOUNTER — Encounter (HOSPITAL_COMMUNITY): Payer: Self-pay | Admitting: Urology

## 2019-09-24 ENCOUNTER — Other Ambulatory Visit (HOSPITAL_COMMUNITY): Payer: Self-pay | Admitting: Urology

## 2019-09-24 ENCOUNTER — Other Ambulatory Visit: Payer: Self-pay | Admitting: Urology

## 2019-09-24 DIAGNOSIS — N200 Calculus of kidney: Secondary | ICD-10-CM

## 2019-09-30 ENCOUNTER — Ambulatory Visit (HOSPITAL_COMMUNITY): Payer: Medicaid Other

## 2019-09-30 ENCOUNTER — Other Ambulatory Visit (HOSPITAL_COMMUNITY): Payer: Self-pay | Admitting: Urology

## 2019-09-30 DIAGNOSIS — N200 Calculus of kidney: Secondary | ICD-10-CM

## 2019-10-10 ENCOUNTER — Encounter (HOSPITAL_COMMUNITY)
Admission: RE | Admit: 2019-10-10 | Discharge: 2019-10-10 | Disposition: A | Discharge status: Home / Self Care | Payer: Medicaid Other | Source: Ambulatory Visit | Attending: Urology | Admitting: Urology

## 2019-10-10 ENCOUNTER — Other Ambulatory Visit: Payer: Self-pay

## 2019-10-10 DIAGNOSIS — N200 Calculus of kidney: Secondary | ICD-10-CM | POA: Diagnosis present

## 2019-10-10 MED ORDER — TECHNETIUM TC 99M MERTIATIDE
5.3000 | Freq: Once | INTRAVENOUS | Status: AC | PRN
  Administered 2019-10-10: 5.3 via INTRAVENOUS

## 2019-10-10 MED ORDER — FUROSEMIDE 10 MG/ML IJ SOLN
INTRAMUSCULAR | Status: AC
  Filled 2019-10-10: qty 4

## 2019-10-10 MED ORDER — FUROSEMIDE 10 MG/ML IJ SOLN
40.0000 mg | Freq: Once | INTRAMUSCULAR | Status: AC
  Administered 2019-10-10: 40 mg via INTRAVENOUS

## 2019-10-17 ENCOUNTER — Other Ambulatory Visit: Payer: Self-pay | Admitting: Urology

## 2019-10-22 NOTE — Patient Instructions (Signed)
DUE TO COVID-19 ONLY ONE VISITOR IS ALLOWED TO COME WITH YOU AND STAY IN THE WAITING ROOM ONLY DURING PRE OP AND PROCEDURE DAY OF SURGERY. THE 2 VISITORS  MAY VISIT WITH YOU AFTER SURGERY IN YOUR PRIVATE ROOM DURING VISITING HOURS ONLY!  YOU NEED TO HAVE A COVID 19 TEST ON__4/19/21_____ @__12 :30_____, THIS TEST MUST BE DONE BEFORE SURGERY, COME  [address], [place] , .  [facility]) ONCE YOUR COVID TEST IS COMPLETED, PLEASE BEGIN THE QUARANTINE INSTRUCTIONS AS OUTLINED IN YOUR HANDOUT.                LAWREN SEXSON    Your procedure is scheduled on: 10/31/19   Report to [facility] Main  Entrance   Report to admitting at  9:00 AM     Call this number if you have problems the morning of surgery [phone]    Remember: Do not eat food or drink liquids :After Midnight.   BRUSH YOUR TEETH MORNING OF SURGERY AND RINSE YOUR MOUTH OUT, NO CHEWING GUM CANDY OR MINTS.     Take these medicines the morning of surgery with A SIP OF WATER: Oxybutynin, Pain med if needed                                 You may not have any metal on your body including hair pins and              piercings  Do not wear jewelry, make-up, lotions, powders or perfumes, deodorant             Do not wear nail polish on your fingernails.  Do not shave  48 hours prior to surgery.     Do not bring valuables to the hospital. [facility] IS NOT             RESPONSIBLE   FOR VALUABLES.  Contacts, dentures or bridgework may not be worn into surgery.                   Please read over the following fact sheets you were given: _____________________________________________________________________             [facility] - Preparing for Surgery Before surgery, you can play an important role.   Because skin is not sterile, your skin needs to be as free of germs as possible.   You can reduce the number of germs on your skin by washing with CHG (chlorahexidine gluconate) soap  before surgery.   CHG is an antiseptic cleaner which kills germs and bonds with the skin to continue killing germs even after washing. Please DO NOT use if you have an allergy to CHG or antibacterial soaps .  If your skin becomes reddened/irritated stop using the CHG and inform your nurse when you arrive at Short Stay. Do not shave (including legs and underarms) for at least 48 hours prior to the first CHG shower.   Please follow these instructions carefully:  1.  Shower with CHG Soap the night before surgery and the  morning of Surgery.  2.  If you choose to wash your hair, wash your hair first as usual with your  normal  shampoo.  3.  After you shampoo, rinse your hair and body thoroughly to remove the  shampoo.  4.  Use CHG as you would any other liquid soap.  You can apply chg directly  to the skin and wash                       Gently with a scrungie or clean washcloth.  5.  Apply the CHG Soap to your body ONLY FROM THE NECK DOWN.   Do not use on face/ open                           Wound or open sores. Avoid contact with eyes, ears mouth and genitals (private parts).                       Wash face,  Genitals (private parts) with your normal soap.             6.  Wash thoroughly, paying special attention to the area where your surgery  will be performed.  7.  Thoroughly rinse your body with warm water from the neck down.  8.  DO NOT shower/wash with your normal soap after using and rinsing off  the CHG Soap.             9.  Pat yourself dry with a clean towel.            10.  Wear clean pajamas.            11.  Place clean sheets on your bed the night of your first shower and do not  sleep with pets. Day of Surgery : Do not apply any lotions/deodorants the morning of surgery.  Please wear clean clothes to the hospital/surgery center.  FAILURE TO FOLLOW THESE INSTRUCTIONS MAY RESULT IN THE CANCELLATION OF YOUR SURGERY PATIENT  SIGNATURE_________________________________  NURSE SIGNATURE__________________________________  ________________________________________________________________________

## 2019-10-23 ENCOUNTER — Encounter (HOSPITAL_COMMUNITY)
Admission: RE | Admit: 2019-10-23 | Discharge: 2019-10-23 | Disposition: A | Discharge status: Home / Self Care | Payer: Medicaid Other | Source: Ambulatory Visit | Attending: Urology | Admitting: Urology

## 2019-10-23 ENCOUNTER — Encounter (HOSPITAL_COMMUNITY): Payer: Self-pay

## 2019-10-23 ENCOUNTER — Other Ambulatory Visit: Payer: Self-pay

## 2019-10-23 DIAGNOSIS — Z01812 Encounter for preprocedural laboratory examination: Secondary | ICD-10-CM | POA: Diagnosis not present

## 2019-10-23 LAB — CBC
HCT: 42.6 % (ref 36.0–46.0)
Hemoglobin: 13.5 g/dL (ref 12.0–15.0)
MCH: 27.6 pg (ref 26.0–34.0)
MCHC: 31.7 g/dL (ref 30.0–36.0)
MCV: 86.9 fL (ref 80.0–100.0)
Platelets: 216 10*3/uL (ref 150–400)
RBC: 4.9 MIL/uL (ref 3.87–5.11)
RDW: 13 % (ref 11.5–15.5)
WBC: 4.4 10*3/uL (ref 4.0–10.5)
nRBC: 0 % (ref 0.0–0.2)

## 2019-10-23 NOTE — Progress Notes (Addendum)
PCP Dr. Michelle Nasuti-  Cardiologist - none  Chest x-ray - no EKG - no Stress Test - no ECHO - no Cardiac Cath - no  Sleep Study -  CPAP -   Fasting Blood Sugar - NA Checks Blood Sugar _____ times a day  Blood Thinner Instructions:NA Aspirin Instructions: Last Dose:  Anesthesia review:   Patient denies shortness of breath, fever, cough and chest pain at PAT appointment yes  Patient verbalized understanding of instructions that were given to them at the PAT appointment. Patient was also instructed that they will need to review over the PAT instructions again at home before surgery. Yes   I called pt to tell her to hold her Buprenorphine for 3 days prior to DOS as instructed by Anesthesia PA Jodell Cipro

## 2019-10-28 ENCOUNTER — Other Ambulatory Visit (HOSPITAL_COMMUNITY): Payer: Medicaid Other

## 2019-10-28 ENCOUNTER — Other Ambulatory Visit (HOSPITAL_COMMUNITY)
Admission: RE | Admit: 2019-10-28 | Discharge: 2019-10-28 | Disposition: A | Discharge status: Home / Self Care | Payer: Medicaid Other | Source: Ambulatory Visit | Attending: Urology | Admitting: Urology

## 2019-10-28 ENCOUNTER — Other Ambulatory Visit: Payer: Self-pay

## 2019-10-28 DIAGNOSIS — Z01812 Encounter for preprocedural laboratory examination: Secondary | ICD-10-CM | POA: Diagnosis not present

## 2019-10-28 DIAGNOSIS — Z20822 Contact with and (suspected) exposure to covid-19: Secondary | ICD-10-CM | POA: Diagnosis not present

## 2019-10-29 LAB — SARS CORONAVIRUS 2 (TAT 6-24 HRS): SARS Coronavirus 2: NEGATIVE

## 2019-10-29 NOTE — H&P (Signed)
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 Previously, had discussed PCNL to remove residual stone burden. However, pt kidney appears so atrophic that lasix renogram was obtained. It showed 5% function and poor uptake of right kidney. I discussed this with pt and that I would not recommend PCNL in this setting. We discussed observation vs right nephrectomy. Patient would like second opinion about possible nephrectomy. She comes in today with 5 days of right flank pain and started taking old antitbiotics that she had around the house. She feels like something does not feel right with that kidney.    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 She initially underwent PCN tube placement followed by staged ureteroscopy. Stone analysis is calcium appetite. Lower pole access was difficult during ureteroscopy and she still has significant stone burden seen on KUB today. Patient has been doing well since her last visit and denies fever, chills, nausea or vomiting.    06/13/19: 39 year old woman with a history of nephrolithiasis and recent episode of emphysematous pyelitis status post staged ureteroscopy after initial PCN placement. Patient underwent ureteroscopy 11/3 and 11/17. Stone analysis is calcium apatite. She removed her stent at home without difficulty. She is doing great since her surgery. No hematuria or pain.   05/21/19: 39 year old woman with history of right emphysematous pyelitis and multiple right renal calculi status post right PCN placement who is now s/p right retrograde pyelogram, right ureteroscopy with lithotripsy, right stent placement and right PCN tube removal 05/14/19. Patient returns to the office for repeat imaging and scheduling of second-stage ureteroscopy to clear up the rest of the kidney. She has been  doing great since surgery. The PCN trach is healed and she is not leaking from her flank. She is pain free. She does have some mild urinary frequency/urgency with a stent. No fevers, chills, nausea or vomiting.   05/03/19: 39 year old woman who presents the ER with right flank pain found to have emphysematous pyelitis with multiple right renal calculi, a 12 mm right UPJ calculus and a 6 mm distal ureteral calculus. She underwent a RIGHT PCN placement and was discharged from the hospital. She has been doing well with the nephrostomy tube but does have a moderate amount of drainage around the tube. She is completing her last day of antibiotics today. She denies fever, chills or emesis. This is patient's 1st stone episode. She does have a use of opioid addiction is on Suboxone and managing her pain with Tylenol and Motrin.     ALLERGIES: Bactrim    MEDICATIONS: Suboxone     GU PSH: Ureteroscopic laser litho, Right - 05/28/2019, Right - 05/14/2019     NON-GU PSH: Cesarean Delivery     GU PMH: Renal calculus, Will repeat CT scan this week prior to second-stage ureteroscopy. The risks and benefits discussed with the patient of the same as last surgery including bleeding, infection, pain, stent discomfort, inability remove all the stones, damage to surrounding structures, ureteral stricture. Will send urine for culture today. Will call patient with surgery date. - 05/21/2019 Renal and ureteral calculus - 05/03/2019    NON-GU PMH: No Non-GU PMH    FAMILY HISTORY: Brain Cancer - Father Diabetes - Uncle, Aunt skin cancer - Sister   SOCIAL HISTORY: Marital Status: Single Preferred Language: English; Ethnicity: Not Hispanic Or Latino; Race: White Current Smoking Status:  Patient smokes.   Tobacco Use Assessment Completed: Used Tobacco in last 30 days? Has never drank.  Does not drink caffeine.    REVIEW OF SYSTEMS:    GU Review Female:   Patient reports frequent urination, get up at night to  urinate, and leakage of urine. Patient denies hard to postpone urination, burning /pain with urination, stream starts and stops, trouble starting your stream, have to strain to urinate, and being pregnant.  Gastrointestinal (Upper):   Patient denies nausea, vomiting, and indigestion/ heartburn.  Gastrointestinal (Lower):   Patient denies diarrhea and constipation.  Constitutional:   Patient denies fever, night sweats, weight loss, and fatigue.  Skin:   Patient denies skin rash/ lesion and itching.  Eyes:   Patient denies blurred vision and double vision.  Ears/ Nose/ Throat:   Patient denies sore throat and sinus problems.  Hematologic/Lymphatic:   Patient denies swollen glands and easy bruising.  Cardiovascular:   Patient denies leg swelling and chest pains.  Respiratory:   Patient denies cough and shortness of breath.  Endocrine:   Patient denies excessive thirst.  Musculoskeletal:   Patient reports back pain. Patient denies joint pain.  Neurological:   Patient reports headaches. Patient denies dizziness.  Psychologic:   Patient denies depression and anxiety.   Notes: right lower back pain     VITAL SIGNS:      10/15/2019 03:14 PM  Weight 178 lb / 80.74 kg  Height 62 in / 157.48 cm  BP 114/80 mmHg  Pulse 66 /min  Temperature 98.0 F / 36.6 C  BMI 32.6 kg/m   MULTI-SYSTEM PHYSICAL EXAMINATION:    Constitutional: Well-nourished. No physical deformities. Normally developed. Good grooming.  Neck: Neck symmetrical, not swollen. Normal tracheal position.  Respiratory: No labored breathing, no use of accessory muscles.   Cardiovascular: Normal temperature  Skin: No paleness, no jaundice, no cyanosis. No lesion, no ulcer, no rash.  Neurologic / Psychiatric: Oriented to time, oriented to place, oriented to person. No depression, no anxiety, no agitation.  Gastrointestinal: No mass, no tenderness, no rigidity, +mild right CVA ttp  Eyes: Normal conjunctivae. Normal eyelids.  Ears, Nose,  Mouth, and Throat: Left ear no scars, no lesions, no masses. Right ear no scars, no lesions, no masses. Nose no scars, no lesions, no masses. Normal hearing. Normal lips.  Musculoskeletal: Normal gait and station of head and neck.     PAST DATA REVIEWED:  Source Of History:  Patient  Urine Test Review:   Urinalysis  X-Ray Review: C.T. Abdomen/Pelvis: Reviewed Films. Reviewed Report. Discussed With Patient.     PROCEDURES:         C.T. ABD-Pelv w/o - 72620      Patient confirmed No Neulasta OnPro Device.         Urinalysis w/Scope - 81001 Dipstick Dipstick Cont'd Micro  Color: Yellow Bilirubin: Neg WBC/hpf: 20 - 40/hpf  Appearance: Turbid Ketones: Neg RBC/hpf: 3 - 10/hpf  Specific Gravity: 1.015 Blood: 2+ Bacteria: Few (10-25/hpf)  pH: 6.0 Protein: 1+ Cystals: NS (Not Seen)  Glucose: Neg Urobilinogen: 0.2 Casts: NS (Not Seen)    Nitrites: Neg Trichomonas: Not Present    Leukocyte Esterase: 3+ Mucous: Not Present      Epithelial Cells: NS (Not Seen)      Yeast: NS (Not Seen)      Sperm: Not Present    Notes:  unspun microscopic performed     ASSESSMENT:      ICD-10 Details  1 GU:  Pyelonephritis - N10 Right, Acute, Life Threatening - Discussed lasix renogram findings with patient. She became tearful at discussion of nephrectomy as she's had family members on dialysis who subseqently died. I discussed CT findings as well as increased morbidity/mortality from XGP/chronically infected kidney. I'm going to send her urine for culture today although she has been taking antibiotics. I have her continued antiiotics based on urine culture from hospitalization in November 2020 (emphysematous pyelitis). CT shows very small area of possible gas in collecting system. Will await radiology read as well. Plan to call patient to continue discussion regarding nephrectomy.   2   Renal calculus - N20.0    PLAN:            Medications New Meds: Keflex 500 mg capsule 1 capsule PO TID   #30  0  Refill(s)

## 2019-10-31 ENCOUNTER — Inpatient Hospital Stay (HOSPITAL_COMMUNITY): Payer: Medicaid Other | Admitting: Anesthesiology

## 2019-10-31 ENCOUNTER — Encounter (HOSPITAL_COMMUNITY): Payer: Self-pay | Admitting: Anesthesiology

## 2019-10-31 ENCOUNTER — Encounter (HOSPITAL_COMMUNITY)
Admission: RE | Disposition: A | Discharge status: Home / Self Care | Payer: Self-pay | Source: Home / Self Care | Attending: Urology

## 2019-10-31 ENCOUNTER — Inpatient Hospital Stay (HOSPITAL_COMMUNITY): Admission: RE | Admit: 2019-10-31 | Payer: Medicaid Other | Source: Ambulatory Visit

## 2019-10-31 ENCOUNTER — Encounter (HOSPITAL_COMMUNITY): Payer: Self-pay | Admitting: Urology

## 2019-10-31 ENCOUNTER — Inpatient Hospital Stay (HOSPITAL_COMMUNITY)
Admission: RE | Admit: 2019-10-31 | Discharge: 2019-11-04 | DRG: 660 | Disposition: A | Discharge status: Home / Self Care | Payer: Medicaid Other | Attending: Urology | Admitting: Urology

## 2019-10-31 ENCOUNTER — Other Ambulatory Visit: Payer: Self-pay

## 2019-10-31 ENCOUNTER — Ambulatory Visit (HOSPITAL_COMMUNITY): Payer: Medicaid Other

## 2019-10-31 DIAGNOSIS — N118 Other chronic tubulo-interstitial nephritis: Principal | ICD-10-CM | POA: Diagnosis present

## 2019-10-31 DIAGNOSIS — F1120 Opioid dependence, uncomplicated: Secondary | ICD-10-CM | POA: Diagnosis present

## 2019-10-31 DIAGNOSIS — F1121 Opioid dependence, in remission: Secondary | ICD-10-CM | POA: Diagnosis present

## 2019-10-31 DIAGNOSIS — F17210 Nicotine dependence, cigarettes, uncomplicated: Secondary | ICD-10-CM | POA: Diagnosis present

## 2019-10-31 DIAGNOSIS — K660 Peritoneal adhesions (postprocedural) (postinfection): Secondary | ICD-10-CM | POA: Diagnosis present

## 2019-10-31 HISTORY — PX: LAPAROSCOPIC NEPHRECTOMY, HAND ASSISTED: SHX1929

## 2019-10-31 LAB — PREPARE RBC (CROSSMATCH)

## 2019-10-31 LAB — ABO/RH: ABO/RH(D): B POS

## 2019-10-31 LAB — PREGNANCY, URINE: Preg Test, Ur: NEGATIVE

## 2019-10-31 LAB — HEMOGLOBIN AND HEMATOCRIT, BLOOD
HCT: 38.2 % (ref 36.0–46.0)
Hemoglobin: 12.1 g/dL (ref 12.0–15.0)

## 2019-10-31 SURGERY — NEPHRECTOMY, HAND-ASSISTED, LAPAROSCOPIC
Anesthesia: Epidural | Laterality: Right

## 2019-10-31 MED ORDER — ALBUMIN HUMAN 5 % IV SOLN: INTRAVENOUS | Status: DC | PRN

## 2019-10-31 MED ORDER — LACTATED RINGERS IV SOLN: INTRAVENOUS | Status: DC

## 2019-10-31 MED ORDER — DEXAMETHASONE SODIUM PHOSPHATE 10 MG/ML IJ SOLN
INTRAMUSCULAR | Status: AC
  Filled 2019-10-31: qty 1

## 2019-10-31 MED ORDER — ACETAMINOPHEN 500 MG PO TABS
1000.0000 mg | ORAL_TABLET | Freq: Once | ORAL | Status: AC
  Administered 2019-10-31: 1000 mg via ORAL
  Filled 2019-10-31: qty 2

## 2019-10-31 MED ORDER — LIDOCAINE-EPINEPHRINE 2 %-1:100000 IJ SOLN: INTRAMUSCULAR | Status: DC | PRN

## 2019-10-31 MED ORDER — BUPRENORPHINE HCL-NALOXONE HCL 8-2 MG SL FILM: 0.2500 | ORAL_FILM | SUBLINGUAL | Status: DC

## 2019-10-31 MED ORDER — PROMETHAZINE HCL 25 MG/ML IJ SOLN: 6.2500 mg | INTRAMUSCULAR | Status: DC | PRN

## 2019-10-31 MED ORDER — MAGNESIUM CITRATE PO SOLN: 1.0000 | Freq: Once | ORAL | Status: DC

## 2019-10-31 MED ORDER — EPHEDRINE 5 MG/ML INJ
INTRAVENOUS | Status: AC
  Filled 2019-10-31: qty 10

## 2019-10-31 MED ORDER — DIPHENHYDRAMINE HCL 12.5 MG/5ML PO ELIX: 12.5000 mg | ORAL_SOLUTION | Freq: Four times a day (QID) | ORAL | Status: DC | PRN

## 2019-10-31 MED ORDER — FENTANYL CITRATE (PF) 250 MCG/5ML IJ SOLN
INTRAMUSCULAR | Status: AC
  Filled 2019-10-31: qty 5

## 2019-10-31 MED ORDER — ALBUMIN HUMAN 5 % IV SOLN
INTRAVENOUS | Status: AC
  Filled 2019-10-31: qty 250

## 2019-10-31 MED ORDER — ROPIVACAINE HCL 2 MG/ML IJ SOLN
12.0000 mL/h | INTRAMUSCULAR | Status: DC
  Administered 2019-11-01: 8 mL/h via EPIDURAL
  Administered 2019-11-02: 12 mL/h via EPIDURAL
  Filled 2019-10-31 (×4): qty 200

## 2019-10-31 MED ORDER — CHLORHEXIDINE GLUCONATE CLOTH 2 % EX PADS
6.0000 | MEDICATED_PAD | Freq: Every day | CUTANEOUS | Status: DC
  Administered 2019-11-01 – 2019-11-02 (×2): 6 via TOPICAL

## 2019-10-31 MED ORDER — ACETAMINOPHEN 500 MG PO TABS
1000.0000 mg | ORAL_TABLET | Freq: Four times a day (QID) | ORAL | Status: AC
  Administered 2019-10-31 – 2019-11-01 (×4): 1000 mg via ORAL
  Filled 2019-10-31 (×4): qty 2

## 2019-10-31 MED ORDER — PROPOFOL 10 MG/ML IV BOLUS
INTRAVENOUS | Status: DC | PRN
  Administered 2019-10-31: 150 mg via INTRAVENOUS

## 2019-10-31 MED ORDER — ONDANSETRON HCL 4 MG/2ML IJ SOLN
INTRAMUSCULAR | Status: AC
  Filled 2019-10-31: qty 2

## 2019-10-31 MED ORDER — DIPHENHYDRAMINE HCL 50 MG/ML IJ SOLN
12.5000 mg | Freq: Four times a day (QID) | INTRAMUSCULAR | Status: DC | PRN
  Administered 2019-11-03: 12.5 mg via INTRAVENOUS
  Filled 2019-10-31: qty 1

## 2019-10-31 MED ORDER — LACTATED RINGERS IR SOLN
Status: DC | PRN
  Administered 2019-10-31: 1000 mL

## 2019-10-31 MED ORDER — SUGAMMADEX SODIUM 200 MG/2ML IV SOLN
INTRAVENOUS | Status: DC | PRN
  Administered 2019-10-31: 200 mg via INTRAVENOUS

## 2019-10-31 MED ORDER — HYDROMORPHONE HCL 1 MG/ML IJ SOLN
0.2500 mg | INTRAMUSCULAR | Status: DC | PRN
  Administered 2019-10-31 (×3): 0.5 mg via INTRAVENOUS

## 2019-10-31 MED ORDER — EPHEDRINE SULFATE-NACL 50-0.9 MG/10ML-% IV SOSY
PREFILLED_SYRINGE | INTRAVENOUS | Status: DC | PRN
  Administered 2019-10-31: 15 mg via INTRAVENOUS
  Administered 2019-10-31: 5 mg via INTRAVENOUS

## 2019-10-31 MED ORDER — CEFAZOLIN SODIUM-DEXTROSE 2-4 GM/100ML-% IV SOLN
2.0000 g | INTRAVENOUS | Status: AC
  Administered 2019-10-31: 2 g via INTRAVENOUS
  Filled 2019-10-31: qty 100

## 2019-10-31 MED ORDER — 0.9 % SODIUM CHLORIDE (POUR BTL) OPTIME
TOPICAL | Status: DC | PRN
  Administered 2019-10-31: 13:00:00 1000 mL

## 2019-10-31 MED ORDER — DEXTROSE-NACL 5-0.45 % IV SOLN: INTRAVENOUS | Status: DC

## 2019-10-31 MED ORDER — FENTANYL CITRATE (PF) 100 MCG/2ML IJ SOLN
50.0000 ug | INTRAMUSCULAR | Status: DC
  Administered 2019-10-31: 100 ug via INTRAVENOUS
  Filled 2019-10-31: qty 2

## 2019-10-31 MED ORDER — LORAZEPAM 1 MG PO TABS
1.0000 mg | ORAL_TABLET | Freq: Four times a day (QID) | ORAL | Status: AC | PRN
  Administered 2019-10-31: 1 mg via ORAL
  Filled 2019-10-31: qty 1

## 2019-10-31 MED ORDER — LIDOCAINE-EPINEPHRINE (PF) 2 %-1:200000 IJ SOLN
INTRAMUSCULAR | Status: DC | PRN
  Administered 2019-10-31: 3 mL via EPIDURAL
  Administered 2019-10-31: 2 mL via EPIDURAL

## 2019-10-31 MED ORDER — ROCURONIUM BROMIDE 10 MG/ML (PF) SYRINGE
PREFILLED_SYRINGE | INTRAVENOUS | Status: DC | PRN
  Administered 2019-10-31 (×2): 20 mg via INTRAVENOUS
  Administered 2019-10-31: 30 mg via INTRAVENOUS
  Administered 2019-10-31: 20 mg via INTRAVENOUS
  Administered 2019-10-31: 50 mg via INTRAVENOUS

## 2019-10-31 MED ORDER — ROPIVACAINE HCL 2 MG/ML IJ SOLN
INTRAMUSCULAR | Status: DC | PRN
  Administered 2019-10-31: 4 mL/h via EPIDURAL

## 2019-10-31 MED ORDER — HYDROMORPHONE HCL 1 MG/ML IJ SOLN: 0.2500 mg | INTRAMUSCULAR | Status: DC | PRN

## 2019-10-31 MED ORDER — DEXAMETHASONE SODIUM PHOSPHATE 10 MG/ML IJ SOLN
INTRAMUSCULAR | Status: DC | PRN
  Administered 2019-10-31: 10 mg via INTRAVENOUS

## 2019-10-31 MED ORDER — CLONIDINE HCL 0.1 MG PO TABS: 0.1000 mg | ORAL_TABLET | Freq: Three times a day (TID) | ORAL | Status: DC | PRN

## 2019-10-31 MED ORDER — SODIUM CHLORIDE (PF) 0.9 % IJ SOLN
INTRAMUSCULAR | Status: AC
  Filled 2019-10-31: qty 10

## 2019-10-31 MED ORDER — ROCURONIUM BROMIDE 10 MG/ML (PF) SYRINGE
PREFILLED_SYRINGE | INTRAVENOUS | Status: AC
  Filled 2019-10-31: qty 10

## 2019-10-31 MED ORDER — ONDANSETRON HCL 4 MG/2ML IJ SOLN
INTRAMUSCULAR | Status: DC | PRN
  Administered 2019-10-31: 4 mg via INTRAVENOUS

## 2019-10-31 MED ORDER — FENTANYL CITRATE (PF) 250 MCG/5ML IJ SOLN
INTRAMUSCULAR | Status: DC | PRN
  Administered 2019-10-31 (×2): 50 ug via INTRAVENOUS
  Administered 2019-10-31: 100 ug via INTRAVENOUS
  Administered 2019-10-31: 50 ug via INTRAVENOUS

## 2019-10-31 MED ORDER — BUPIVACAINE HCL (PF) 0.25 % IJ SOLN
INTRAMUSCULAR | Status: DC | PRN
  Administered 2019-10-31: 5 mL via EPIDURAL
  Administered 2019-10-31: 3 mL via EPIDURAL

## 2019-10-31 MED ORDER — BUPRENORPHINE HCL-NALOXONE HCL 2-0.5 MG SL SUBL
2.0000 | SUBLINGUAL_TABLET | Freq: Every day | SUBLINGUAL | Status: DC
  Administered 2019-10-31: 2 via SUBLINGUAL
  Filled 2019-10-31: qty 2

## 2019-10-31 MED ORDER — LIDOCAINE 2% (20 MG/ML) 5 ML SYRINGE
INTRAMUSCULAR | Status: AC
  Filled 2019-10-31: qty 5

## 2019-10-31 MED ORDER — GABAPENTIN 300 MG PO CAPS
300.0000 mg | ORAL_CAPSULE | Freq: Once | ORAL | Status: AC
  Administered 2019-10-31: 300 mg via ORAL
  Filled 2019-10-31: qty 1

## 2019-10-31 MED ORDER — CEFAZOLIN SODIUM-DEXTROSE 1-4 GM/50ML-% IV SOLN
1.0000 g | Freq: Three times a day (TID) | INTRAVENOUS | Status: AC
  Administered 2019-10-31 – 2019-11-01 (×3): 1 g via INTRAVENOUS
  Filled 2019-10-31 (×3): qty 50

## 2019-10-31 MED ORDER — ONDANSETRON HCL 4 MG/2ML IJ SOLN
4.0000 mg | INTRAMUSCULAR | Status: DC | PRN
  Administered 2019-11-02: 4 mg via INTRAVENOUS
  Filled 2019-10-31: qty 2

## 2019-10-31 MED ORDER — SODIUM CHLORIDE 0.9 % IV SOLN: INTRAVENOUS | Status: DC | PRN

## 2019-10-31 MED ORDER — MIDAZOLAM HCL 2 MG/2ML IJ SOLN
1.0000 mg | Freq: Once | INTRAMUSCULAR | Status: AC
  Administered 2019-10-31: 1 mg via INTRAVENOUS

## 2019-10-31 MED ORDER — SIMETHICONE 80 MG PO CHEW
80.0000 mg | CHEWABLE_TABLET | Freq: Once | ORAL | Status: AC
  Administered 2019-10-31: 80 mg via ORAL
  Filled 2019-10-31: qty 1

## 2019-10-31 MED ORDER — MIDAZOLAM HCL 2 MG/2ML IJ SOLN
INTRAMUSCULAR | Status: AC
  Filled 2019-10-31: qty 2

## 2019-10-31 MED ORDER — HYDROMORPHONE HCL 1 MG/ML IJ SOLN
INTRAMUSCULAR | Status: AC
  Administered 2019-10-31: 0.5 mg via INTRAVENOUS
  Filled 2019-10-31: qty 1

## 2019-10-31 MED ORDER — HYDROMORPHONE HCL 1 MG/ML IJ SOLN
INTRAMUSCULAR | Status: AC
  Filled 2019-10-31: qty 1

## 2019-10-31 MED ORDER — BUPRENORPHINE HCL-NALOXONE HCL 2-0.5 MG SL SUBL
1.0000 | SUBLINGUAL_TABLET | Freq: Every day | SUBLINGUAL | Status: DC
  Administered 2019-11-01: 1 via SUBLINGUAL
  Filled 2019-10-31: qty 1

## 2019-10-31 MED ORDER — MIDAZOLAM HCL 5 MG/5ML IJ SOLN
INTRAMUSCULAR | Status: DC | PRN
  Administered 2019-10-31: 2 mg via INTRAVENOUS

## 2019-10-31 MED ORDER — KETAMINE HCL 10 MG/ML IJ SOLN
INTRAMUSCULAR | Status: DC | PRN
  Administered 2019-10-31: 25 mg via INTRAVENOUS

## 2019-10-31 MED ORDER — DOCUSATE SODIUM 100 MG PO CAPS
100.0000 mg | ORAL_CAPSULE | Freq: Two times a day (BID) | ORAL | Status: DC
  Administered 2019-10-31 – 2019-11-04 (×8): 100 mg via ORAL
  Filled 2019-10-31 (×8): qty 1

## 2019-10-31 MED ORDER — BELLADONNA ALKALOIDS-OPIUM 16.2-60 MG RE SUPP: 1.0000 | Freq: Four times a day (QID) | RECTAL | Status: DC | PRN

## 2019-10-31 MED ORDER — KETAMINE HCL 10 MG/ML IJ SOLN
INTRAMUSCULAR | Status: AC
  Filled 2019-10-31: qty 1

## 2019-10-31 MED ORDER — MIDAZOLAM HCL 2 MG/2ML IJ SOLN
1.0000 mg | INTRAMUSCULAR | Status: DC
  Administered 2019-10-31: 2 mg via INTRAVENOUS
  Filled 2019-10-31: qty 2

## 2019-10-31 MED ORDER — BUPIVACAINE LIPOSOME 1.3 % IJ SUSP
20.0000 mL | Freq: Once | INTRAMUSCULAR | Status: DC
  Filled 2019-10-31: qty 20

## 2019-10-31 SURGICAL SUPPLY — 71 items
ADH SKN CLS APL DERMABOND .7 (GAUZE/BANDAGES/DRESSINGS)
AGENT HMST KT MTR STRL THRMB (HEMOSTASIS)
APL PRP STRL LF DISP 70% ISPRP (MISCELLANEOUS) ×1
APL SRG 38 LTWT LNG FL B (MISCELLANEOUS) ×1
APPLICATOR ARISTA FLEXITIP XL (MISCELLANEOUS) ×1 IMPLANT
BAG LAPAROSCOPIC 12 15 PORT 16 (BASKET) IMPLANT
BAG RETRIEVAL 12/15 (BASKET)
BAG SPEC RTRVL LRG 6X4 10 (ENDOMECHANICALS)
BAG SPEC THK2 15X12 ZIP CLS (MISCELLANEOUS) ×1
BAG ZIPLOCK 12X15 (MISCELLANEOUS) ×2 IMPLANT
BLADE EXTENDED COATED 6.5IN (ELECTRODE) IMPLANT
BLADE SURG SZ10 CARB STEEL (BLADE) IMPLANT
CABLE HIGH FREQUENCY MONO STRZ (ELECTRODE) ×2 IMPLANT
CHLORAPREP W/TINT 26 (MISCELLANEOUS) ×2 IMPLANT
CLEANER TIP ELECTROSURG 2X2 (MISCELLANEOUS) IMPLANT
CLIP VESOLOCK LG 6/CT PURPLE (CLIP) ×2 IMPLANT
CLIP VESOLOCK MED LG 6/CT (CLIP) IMPLANT
CLIP VESOLOCK XL 6/CT (CLIP) IMPLANT
COVER SURGICAL LIGHT HANDLE (MISCELLANEOUS) ×2 IMPLANT
COVER WAND RF STERILE (DRAPES) IMPLANT
CUTTER FLEX LINEAR 45M (STAPLE) IMPLANT
DERMABOND ADVANCED (GAUZE/BANDAGES/DRESSINGS)
DERMABOND ADVANCED .7 DNX12 (GAUZE/BANDAGES/DRESSINGS) IMPLANT
DRAIN CHANNEL 10F 3/8 F FF (DRAIN) IMPLANT
DRAPE INCISE IOBAN 66X45 STRL (DRAPES) ×2 IMPLANT
DRSG TEGADERM 4X4.75 (GAUZE/BANDAGES/DRESSINGS) IMPLANT
DRSG TELFA 3X8 NADH (GAUZE/BANDAGES/DRESSINGS) ×2 IMPLANT
ELECT PENCIL ROCKER SW 15FT (MISCELLANEOUS) ×2 IMPLANT
ELECT REM PT RETURN 15FT ADLT (MISCELLANEOUS) ×2 IMPLANT
EVACUATOR SILICONE 100CC (DRAIN) IMPLANT
GAUZE SPONGE 4X4 12PLY STRL (GAUZE/BANDAGES/DRESSINGS) ×1 IMPLANT
GLOVE BIO SURGEON STRL SZ 6.5 (GLOVE) ×2 IMPLANT
GLOVE BIO SURGEON STRL SZ7.5 (GLOVE) ×2 IMPLANT
GOWN STRL REUS W/TWL LRG LVL3 (GOWN DISPOSABLE) ×4 IMPLANT
GOWN STRL REUS W/TWL XL LVL3 (GOWN DISPOSABLE) ×2 IMPLANT
HANDLE STAPLE EGIA 4 XL (STAPLE) ×1 IMPLANT
HEMOSTAT ARISTA ABSORB 3G PWDR (HEMOSTASIS) ×1 IMPLANT
HEMOSTAT SURGICEL 4X8 (HEMOSTASIS) IMPLANT
IRRIG SUCT STRYKERFLOW 2 WTIP (MISCELLANEOUS)
IRRIGATION SUCT STRKRFLW 2 WTP (MISCELLANEOUS) IMPLANT
KIT BASIN (CUSTOM PROCEDURE TRAY) ×2 IMPLANT
KIT TURNOVER KIT A (KITS) IMPLANT
LIGASURE VESSEL 5MM BLUNT TIP (ELECTROSURGICAL) ×1 IMPLANT
MARKER SKIN SURG 5.25 VIO NS (MISCELLANEOUS) ×1 IMPLANT
PAD DRESSING TELFA 3X8 NADH (GAUZE/BANDAGES/DRESSINGS) IMPLANT
POUCH SPECIMEN RETRIEVAL 10MM (ENDOMECHANICALS) IMPLANT
RELOAD 45 VASCULAR/THIN (ENDOMECHANICALS) IMPLANT
RELOAD EGIA 45 TAN VASC (STAPLE) ×4 IMPLANT
RELOAD STAPLE 45 2.5 WHT GRN (ENDOMECHANICALS) IMPLANT
SCISSORS LAP 5X35 DISP (ENDOMECHANICALS) ×1 IMPLANT
SET TUBE SMOKE EVAC HIGH FLOW (TUBING) ×2 IMPLANT
SPONGE LAP 18X18 RF (DISPOSABLE) IMPLANT
SPONGE LAP 4X18 RFD (DISPOSABLE) ×1 IMPLANT
STAPLER VISISTAT 35W (STAPLE) ×1 IMPLANT
SURGIFLO W/THROMBIN 8M KIT (HEMOSTASIS) IMPLANT
SUT ETHILON 3 0 PS 1 (SUTURE) IMPLANT
SUT MNCRL AB 4-0 PS2 18 (SUTURE) IMPLANT
SUT PDS AB 0 CTX 60 (SUTURE) IMPLANT
SUT PDS AB 1 TP1 96 (SUTURE) ×4 IMPLANT
SUT VIC AB 2-0 SH 27 (SUTURE) ×4
SUT VIC AB 2-0 SH 27X BRD (SUTURE) IMPLANT
SUT VICRYL 0 UR6 27IN ABS (SUTURE) IMPLANT
SYS LAPSCP GELPORT 120MM (MISCELLANEOUS) ×2
SYSTEM LAPSCP GELPORT 120MM (MISCELLANEOUS) ×1 IMPLANT
TOWEL OR 17X26 10 PK STRL BLUE (TOWEL DISPOSABLE) ×2 IMPLANT
TRAY FOLEY MTR SLVR 16FR STAT (SET/KITS/TRAYS/PACK) ×2 IMPLANT
TRAY LAPAROSCOPIC (CUSTOM PROCEDURE TRAY) ×2 IMPLANT
TROCAR BLADELESS OPT 5 100 (ENDOMECHANICALS) ×1 IMPLANT
TROCAR UNIVERSAL OPT 12M 100M (ENDOMECHANICALS) ×3 IMPLANT
TROCAR XCEL 12X100 BLDLESS (ENDOMECHANICALS) ×2 IMPLANT
YANKAUER SUCT BULB TIP 10FT TU (MISCELLANEOUS) ×1 IMPLANT

## 2019-10-31 NOTE — Anesthesia Postprocedure Evaluation (Signed)
Anesthesia Post Note  Patient: WALBURGA HUDMAN  Procedure(s) Performed: HAND ASSISTED LAPAROSCOPIC RIGHT NEPHRECTOMY (Right )     Patient location during evaluation: PACU Anesthesia Type: Epidural Level of consciousness: awake and alert Pain management: pain level controlled Vital Signs Assessment: post-procedure vital signs reviewed and stable Respiratory status: spontaneous breathing, nonlabored ventilation, respiratory function stable and patient connected to nasal cannula oxygen Cardiovascular status: blood pressure returned to baseline and stable Postop Assessment: no apparent nausea or vomiting Anesthetic complications: no    Last Vitals:  Vitals:   10/31/19 1630 10/31/19 1645  BP: 113/77 122/80  Pulse: 63 76  Resp: 10 15  Temp:    SpO2: 100% 100%    Last Pain:  Vitals:   10/31/19 1645  TempSrc:   PainSc: Ardean Larsen

## 2019-10-31 NOTE — Transfer of Care (Signed)
Immediate Anesthesia Transfer of Care Note  Patient: Pamela Horn  Procedure(s) Performed: HAND ASSISTED LAPAROSCOPIC RIGHT NEPHRECTOMY (Right )  Patient Location: PACU  Anesthesia Type:General  Level of Consciousness: awake, alert  and oriented  Airway & Oxygen Therapy: Patient Spontanous Breathing and Patient connected to face mask oxygen  Post-op Assessment: Report given to RN and Post -op Vital signs reviewed and stable  Post vital signs: Reviewed and stable  Last Vitals:  Vitals Value Taken Time  BP    Temp    Pulse    Resp    SpO2      Last Pain:  Vitals:   10/31/19 0955  TempSrc: Oral  PainSc:       Patients Stated Pain Goal: 4 (10/31/19 0952)  Complications: No apparent anesthesia complications

## 2019-10-31 NOTE — Progress Notes (Signed)
AssistedDr. Casilda Carls with epidural block. Side rails up, monitors on throughout procedure. See vital signs in flow sheet. Tolerated Procedure well.

## 2019-10-31 NOTE — Anesthesia Post-op Follow-up Note (Signed)
  Anesthesia Pain Follow-up Note  Patient: Pamela Horn  Day #: 0  Date of Follow-up: 10/31/2019 Time: 4:55 PM  Last Vitals:  Vitals:   10/31/19 1615 10/31/19 1630  BP: 139/89 113/77  Pulse: 66 63  Resp: 13 10  Temp:    SpO2: 100% 100%    Level of Consciousness: alert  Pain: mild   Side Effects:None  Catheter Site Exam:clean, dry  Epidural / Intrathecal (From admission, onward)   Start     Dose/Rate Route Frequency Ordered Stop   10/31/19 1045  ropivacaine (PF) 2 mg/mL (0.2%) (NAROPIN) injection     8 mL/hr 8 mL/hr  Epidural Continuous 10/31/19 1042         Plan: Continue current therapy of postop epidural at surgeon's request. Pt seen in PACU with T6-T12 levels to ice bilaterally after bolus dose given through epidural. Continue epidural at current rate. Plan to d/c on POD2 Saturday for now.   Kennieth Rad

## 2019-10-31 NOTE — Anesthesia Preprocedure Evaluation (Addendum)
Anesthesia Evaluation  Patient identified by MRN, date of birth, ID band Patient awake    Reviewed: Allergy & Precautions, NPO status , Patient's Chart, lab work & pertinent test results  Airway Mallampati: II  TM Distance: >3 FB Neck ROM: Full    Dental  (+) Dental Advisory Given   Pulmonary Current Smoker,    breath sounds clear to auscultation       Cardiovascular hypertension,  Rhythm:Regular Rate:Normal     Neuro/Psych negative neurological ROS     GI/Hepatic negative GI ROS, Neg liver ROS,   Endo/Other  negative endocrine ROS  Renal/GU Renal disease     Musculoskeletal   Abdominal   Peds  Hematology negative hematology ROS (+)   Anesthesia Other Findings   Reproductive/Obstetrics                            Lab Results  Component Value Date   WBC 4.4 10/23/2019   HGB 13.5 10/23/2019   HCT 42.6 10/23/2019   MCV 86.9 10/23/2019   PLT 216 10/23/2019   Lab Results  Component Value Date   CREATININE 1.09 (H) 05/27/2019   BUN 17 05/27/2019   NA 139 05/27/2019   K 5.1 05/27/2019   CL 108 05/27/2019   CO2 24 05/27/2019    Anesthesia Physical Anesthesia Plan  ASA: II  Anesthesia Plan: General and Epidural   Post-op Pain Management:    Induction: Intravenous  PONV Risk Score and Plan: 2 and Dexamethasone, Ondansetron and Treatment may vary due to age or medical condition  Airway Management Planned: Oral ETT  Additional Equipment:   Intra-op Plan:   Post-operative Plan: Extubation in OR  Informed Consent: I have reviewed the patients History and Physical, chart, labs and discussed the procedure including the risks, benefits and alternatives for the proposed anesthesia with the patient or authorized representative who has indicated his/her understanding and acceptance.     Dental advisory given  Plan Discussed with: CRNA  Anesthesia Plan Comments:         Anesthesia Quick Evaluation

## 2019-10-31 NOTE — Anesthesia Procedure Notes (Signed)
Epidural Patient location during procedure: pre-op Start time: 10/31/2019 10:45 AM End time: 10/31/2019 11:05 AM  Staffing Anesthesiologist: Marcene Duos, MD Performed: anesthesiologist   Preanesthetic Checklist Completed: patient identified, IV checked, site marked, risks and benefits discussed, surgical consent, monitors and equipment checked, pre-op evaluation and timeout performed  Epidural Patient position: sitting Prep: DuraPrep and site prepped and draped Patient monitoring: continuous pulse ox and blood pressure Approach: midline Epidural location: T9-T10. Injection technique: LOR air  Needle:  Needle type: Tuohy  Needle gauge: 17 G Needle length: 9 cm and 9 Needle insertion depth: 8 cm Catheter type: closed end flexible Catheter size: 19 Gauge Catheter at skin depth: 13 cm Test dose: negative and 2% lidocaine with Epi 1:200 K  Assessment Events: blood not aspirated, injection not painful, no injection resistance, no paresthesia and negative IV test

## 2019-10-31 NOTE — Anesthesia Procedure Notes (Signed)
Procedure Name: Intubation Date/Time: 10/31/2019 11:33 AM Performed by: Talbot Grumbling, CRNA Pre-anesthesia Checklist: Patient identified, Emergency Drugs available, Suction available and Patient being monitored Patient Re-evaluated:Patient Re-evaluated prior to induction Oxygen Delivery Method: Circle system utilized Preoxygenation: Pre-oxygenation with 100% oxygen Induction Type: IV induction Ventilation: Mask ventilation without difficulty Laryngoscope Size: Mac and 3 Grade View: Grade I Tube type: Oral Tube size: 7.5 mm Number of attempts: 1 Airway Equipment and Method: Stylet Placement Confirmation: ETT inserted through vocal cords under direct vision,  positive ETCO2 and breath sounds checked- equal and bilateral Secured at: 21 cm Tube secured with: Tape Dental Injury: Teeth and Oropharynx as per pre-operative assessment

## 2019-10-31 NOTE — Interval H&P Note (Signed)
History and Physical Interval Note: Patient has been taking Keflex since her last office visit.  We again discussed the risks and benefits of a nephrectomy including but not limited to bleeding, infection, worsening renal function, pain, damage to surrounding structures, need for future intervention.  She understands his risks and is ready to proceed with surgery.  Informed consent was obtained.  In addition I have spoken to her Suboxone clinic and we have a plan in place for postoperative pain management.  10/31/2019 10:43 AM  Pamela Horn  has presented today for surgery, with the diagnosis of RIGHT NEPHROLITHIASIS.  The various methods of treatment have been discussed with the patient and family. After consideration of risks, benefits and other options for treatment, the patient has consented to  Procedure(s): HAND ASSISTED LAPAROSCOPIC RIGHT NEPHRECTOMY (Right) as a surgical intervention.  The patient's history has been reviewed, patient examined, no change in status, stable for surgery.  I have reviewed the patient's chart and labs.  Questions were answered to the patient's satisfaction.     Pamela Horn

## 2019-10-31 NOTE — Op Note (Signed)
Operative Note  Preoperative diagnosis:  1. Right chronically infected/obstructed kidney  Postoperative diagnosis: 1. same  Procedure(s): 1. Right hand assisted laparoscopic simple nephrectomy  Surgeon: Kasandra Knudsen, MD  Assistants: Harrie Foreman, PA; An assistant was needed throughout the case further expertise in assisting with a laparoscopic surgery, including visualization with the camera, passing instruments, etc.  Anesthesia: General  Complications: None  EBL: 150 cc  Specimens: 1. Right kidney  Drains/Catheters: 1. Foley catheter  Intraoperative findings: Right kidney removed entirely  Indication: 39 year old woman who was found on imaging to have a right chronically infected/obstructed and non-functional kidney causing infection.  After discussion of different options, the patient elected to undergo the above operation.  Description of procedure:  The patient was identified and consent was obtained.  The patient was taken to the operating room and placed in the supine position.  The patient was placed under general anesthesia.  Perioperative antibiotics were administered.  The patient was placed in right lateral position at approximately 65 degrees and all pressure points were padded.  Patient was prepped and draped in a standard sterile fashion and a timeout was performed.  An 6 cm periumbilical incision was made sharply into the skin.  This was carried down with Bovie electrocautery down to the anterior rectus sheath which was divided with electrocautery.  The underlying musculature was separated in the midline.  Sharp dissection with Metzenbaum scissors was used to open up the posterior sheath and peritoneum.  This was extended with electrocautery taking great care not to use cautery near the bowel.  The hand assist port was secured into the incision.  I made sure no bowel was trapped within this.  A 12 mm port was inserted through the hand assist port and the abdomen  was insufflated to a pressure of 15.  A 12 mm port was placed lateral as well as superior to the hand assist port, each 1 about a hand width away. A 5 mm port was placed superior to the midline port and used for liver retraction.  Please note that all ports were placed under direct visualization with the camera.  There were multiple adhesions to the anterior abdominal wall.  These were carefully lysed and divided with hot scissors.    The colon was first dissected medially by incising along the white line of Toldt.  After medializing the colon, the kidney was dissected laterally and medially as well as superiorly.  Inferior attachments as well as the ureter and gonadal vein were divided with LigaSure device. The ureter was divided with ligasure.  I continued to carefully dissect medially and identified the renal hilum.  The renal vein and renal artery were divided en bloc 45 mm vascular staple load.  Superior attachments were then released using LigaSure device as well as blunt dissection.  Once the entire kidney and surrounding Gerota's fascia was freed, the specimen was withdrawn from the midline incision and passed off for permanent specimen.  The abdomen was reinspected and no active bleeding was noted.  Arista was applied to the nephrectomy bed.  The midline fascia was closed with running 0 looped PDS suture followed by staples.  The port incisions were closed with a 2-0 Vicryl followed by dermabond.   A dressing was applied.  Patient tolerated the procedure well and was stable postoperatively.  Plan: Stat labs will be obtained.  Anticipate the patient will be in the hospital 1-2 nights as long as she does well.

## 2019-11-01 LAB — HEMOGLOBIN AND HEMATOCRIT, BLOOD
HCT: 32.4 % — ABNORMAL LOW (ref 36.0–46.0)
Hemoglobin: 10.4 g/dL — ABNORMAL LOW (ref 12.0–15.0)

## 2019-11-01 LAB — BASIC METABOLIC PANEL
Anion gap: 8 (ref 5–15)
BUN: 10 mg/dL (ref 6–20)
CO2: 25 mmol/L (ref 22–32)
Calcium: 7.9 mg/dL — ABNORMAL LOW (ref 8.9–10.3)
Chloride: 107 mmol/L (ref 98–111)
Creatinine, Ser: 0.93 mg/dL (ref 0.44–1.00)
GFR calc Af Amer: >60 mL/min (ref >60)
GFR calc non Af Amer: >60 mL/min (ref >60)
Glucose, Bld: 127 mg/dL — ABNORMAL HIGH (ref 70–99)
Potassium: 4 mmol/L (ref 3.5–5.1)
Sodium: 140 mmol/L (ref 135–145)

## 2019-11-01 MED ORDER — HYDROMORPHONE HCL 1 MG/ML IJ SOLN
1.0000 mg | INTRAMUSCULAR | Status: DC | PRN
  Administered 2019-11-01 – 2019-11-04 (×15): 1 mg via INTRAVENOUS
  Filled 2019-11-01 (×16): qty 1

## 2019-11-01 MED ORDER — LORAZEPAM 1 MG PO TABS
1.0000 mg | ORAL_TABLET | Freq: Once | ORAL | Status: AC
  Administered 2019-11-01: 05:00:00 1 mg via ORAL
  Filled 2019-11-01: qty 1

## 2019-11-01 MED ORDER — NICOTINE 21 MG/24HR TD PT24
21.0000 mg | MEDICATED_PATCH | Freq: Every day | TRANSDERMAL | Status: DC
  Administered 2019-11-01 – 2019-11-03 (×3): 21 mg via TRANSDERMAL
  Filled 2019-11-01 (×4): qty 1

## 2019-11-01 MED ORDER — BUPRENORPHINE HCL-NALOXONE HCL 2-0.5 MG SL SUBL
1.0000 | SUBLINGUAL_TABLET | Freq: Three times a day (TID) | SUBLINGUAL | Status: DC
  Administered 2019-11-01 – 2019-11-04 (×9): 1 via SUBLINGUAL
  Filled 2019-11-01 (×9): qty 1

## 2019-11-01 NOTE — Progress Notes (Signed)
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  No nausea or emesis.    Active Problems:   XGP (xanthogranulomatous pyelonephritis)  Current Facility-Administered Medications  Medication Dose Route Frequency Provider Last Rate Last Admin  . acetaminophen (TYLENOL) tablet 1,000 mg  1,000 mg Oral Q6H Dancy, Amanda, PA-C   1,000 mg at 11/01/19 0523  . buprenorphine-naloxone (SUBOXONE) 2-0.5 mg per SL tablet 1 tablet  1 tablet Sublingual Daily Laquonda Welby, Roselee Nova D, MD      . buprenorphine-naloxone (SUBOXONE) 2-0.5 mg per SL tablet 2 tablet  2 tablet Sublingual QHS Noel Christmas, MD   2 tablet at 10/31/19 2132  . ceFAZolin (ANCEF) IVPB 1 g/50 mL premix  1 g Intravenous Q8H Dancy, Amanda, PA-C 100 mL/hr at 11/01/19 0144 1 g at 11/01/19 0144  . Chlorhexidine Gluconate Cloth 2 % PADS 6 each  6 each Topical Daily Sherena Machorro, Roselee Nova D, MD      . cloNIDine (CATAPRES) tablet 0.1 mg  0.1 mg Oral Q8H PRN Dancy, Amanda, PA-C      . dextrose 5 %-0.45 % sodium chloride infusion   Intravenous Continuous Harrie Foreman, PA-C 100 mL/hr at 11/01/19 0358 New Bag at 11/01/19 0358  . diphenhydrAMINE (BENADRYL) injection 12.5-25 mg  12.5-25 mg Intravenous Q6H PRN Harrie Foreman, PA-C       Or  . diphenhydrAMINE (BENADRYL) 12.5 MG/5ML elixir 12.5-25 mg  12.5-25 mg Oral Q6H PRN Dancy, Amanda, PA-C      . docusate sodium (COLACE) capsule 100 mg  100 mg Oral BID Harrie Foreman, PA-C   100 mg at 10/31/19 2132  . ondansetron (ZOFRAN) injection 4 mg  4 mg Intravenous Q4H PRN Dancy, Amanda, PA-C      . opium-belladonna (B&O) suppository 16.2-60mg   1 suppository Rectal Q6H PRN Dancy, Amanda, PA-C      . ropivacaine (PF) 2 mg/mL (0.2%) (NAROPIN) injection  8 mL/hr Epidural Continuous Marcene Duos, MD         Objective: Vital: Vitals:   11/01/19  0034 11/01/19 0146 11/01/19 0526 11/01/19 0533  BP:  104/66 102/64   Pulse:  91 88   Resp:  18 18   Temp: 100.2 F (37.9 C) 100.2 F (37.9 C) 99.4 F (37.4 C)   TempSrc: Oral Oral Axillary   SpO2:  98% 99% 96%  Weight:      Height:       I/Os: I/O last 3 completed shifts: In: 2950 [I.V.:2600; IV Piggyback:350] Out: 600 [Urine:400; Blood:200]  Physical Exam:  General: Patient is in no apparent distress Lungs: Normal respiratory effort, chest expands symmetrically. GI:  abdomen soft, appropriately ttp, incision dressing c/d/i, nondistended Foley: draining clear yellow urine Ext: lower extremities symmetric with SCDs  Lab Results: Recent Labs    10/31/19 1536 11/01/19 0314  HGB 12.1 10.4*  HCT 38.2 32.4*   Recent Labs    11/01/19 0314  NA 140  K 4.0  CL 107  CO2 25  GLUCOSE 127*  BUN 10  CREATININE 0.93  CALCIUM 7.9*   No results for input(s): LABPT, INR in the last 72 hours. No results for input(s): LABURIN in the last 72 hours. Results for orders placed or performed during the hospital encounter of 10/28/19  SARS CORONAVIRUS 2 (TAT 6-24 HRS) Nasopharyngeal Nasopharyngeal Swab     Status: None   Collection Time: 10/28/19  7:14 AM  Specimen: Nasopharyngeal Swab  Result Value Ref Range Status   SARS Coronavirus 2 NEGATIVE NEGATIVE Final    Comment: (NOTE) SARS-CoV-2 target nucleic acids are NOT DETECTED. The SARS-CoV-2 RNA is generally detectable in upper and lower respiratory specimens during the acute phase of infection. Negative results do not preclude SARS-CoV-2 infection, do not rule out co-infections with other pathogens, and should not be used as the sole basis for treatment or other patient management decisions. Negative results must be combined with clinical observations, patient history, and epidemiological information. The expected result is Negative. Fact Sheet for Patients: SugarRoll.be Fact Sheet for Healthcare  Providers: https://www.woods-mathews.com/ This test is not yet approved or cleared by the [place] FDA and  has been authorized for detection and/or diagnosis of SARS-CoV-2 by FDA under an Emergency Use Authorization (EUA). This EUA will remain  in effect (meaning this test can be used) for the duration of the COVID-19 declaration under Section 56 4(b)(1) of the Act, 21 U.S.C. section 360bbb-3(b)(1), unless the authorization is terminated or revoked sooner. Performed at [facility], [place] [address], [place], [place] [address]     Studies/Results: No results found.  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 Non-functional kidney - trend creatinine, AM BMP - advance to clear liquid diet -SCDS at all times while in bed -if epidural run light enough, OOB to chair -continue foley until epidural turned off  2. Postop pain control - hx of opioid addict, will restart suboxone according to [facility] clinic (they will fax over orders after telehealth visit); patient has epidural; scheduled tylenol; IV dilaudid for PRN breakthrough pain    Jacalyn Lefevre, MD Urology 11/01/2019, 5:51 AM

## 2019-11-01 NOTE — Progress Notes (Signed)
Spoke with Yvone Neu at [facility] for pain control recommendations.  Suboxone increased to 2 tabs TID with possible increase to QID tomorrow.  She will speak with patient tomorrow and make further recommendations. They will also provide medication for patient to pick up at discharge.  No narcotics at discharge.    Epidural still running with plans to taper on on POD2 so that foley can be dc and patient can ambulate.    Patient on clear liquids and will advance if hungry or flatus.

## 2019-11-01 NOTE — Progress Notes (Signed)
Dr. Claudette Stapler will be rounding to assess patient.

## 2019-11-01 NOTE — Anesthesia Post-op Follow-up Note (Signed)
ffffff2 Anesthesia Pain Follow-up Note  Patient: Pamela Horn  Day #: 1  Date of Follow-up: 11/01/2019 Time: 2:36 PM  Last Vitals:  Vitals:   11/01/19 0843 11/01/19 1344  BP:  97/65  Pulse:  73  Resp:  19  Temp: 37.1 C 37.2 C  SpO2:  95%    Level of Consciousness: alert  Pain: severe   Side Effects:None  Catheter Site Exam:clean, dry, no drainage  Epidural / Intrathecal (From admission, onward)   Start     Dose/Rate Route Frequency Ordered Stop   10/31/19 1045  ropivacaine (PF) 2 mg/mL (0.2%) (NAROPIN) injection     8 mL/hr 8 mL/hr  Epidural Continuous 10/31/19 1042         Plan: Continue current therapy of postop epidural at surgeon's request Increase dose to 12 cc/hour   Celie Desrochers

## 2019-11-01 NOTE — Progress Notes (Addendum)
Patient C/O continuous pain 10 out of 10 pain scale, mostly with movement. epidural infusing, scheduled tylenol and a dose of ativan given, notified on call anesthesia and Dr. Ilsa Iha paged, waiting on a call back. will continue to assess patient,

## 2019-11-01 NOTE — Progress Notes (Signed)
Patient unable to get up due to pain

## 2019-11-02 LAB — BASIC METABOLIC PANEL
Anion gap: 8 (ref 5–15)
BUN: 6 mg/dL (ref 6–20)
CO2: 25 mmol/L (ref 22–32)
Calcium: 8 mg/dL — ABNORMAL LOW (ref 8.9–10.3)
Chloride: 106 mmol/L (ref 98–111)
Creatinine, Ser: 0.81 mg/dL (ref 0.44–1.00)
GFR calc Af Amer: >60 mL/min (ref >60)
GFR calc non Af Amer: >60 mL/min (ref >60)
Glucose, Bld: 110 mg/dL — ABNORMAL HIGH (ref 70–99)
Potassium: 3.8 mmol/L (ref 3.5–5.1)
Sodium: 139 mmol/L (ref 135–145)

## 2019-11-02 LAB — CBC
HCT: 31.3 % — ABNORMAL LOW (ref 36.0–46.0)
Hemoglobin: 9.8 g/dL — ABNORMAL LOW (ref 12.0–15.0)
MCH: 27.5 pg (ref 26.0–34.0)
MCHC: 31.3 g/dL (ref 30.0–36.0)
MCV: 87.9 fL (ref 80.0–100.0)
Platelets: 205 10*3/uL (ref 150–400)
RBC: 3.56 MIL/uL — ABNORMAL LOW (ref 3.87–5.11)
RDW: 12.9 % (ref 11.5–15.5)
WBC: 7.6 10*3/uL (ref 4.0–10.5)
nRBC: 0 % (ref 0.0–0.2)

## 2019-11-02 MED ORDER — ENOXAPARIN SODIUM 40 MG/0.4ML ~~LOC~~ SOLN
40.0000 mg | SUBCUTANEOUS | Status: DC
  Administered 2019-11-02 – 2019-11-03 (×2): 40 mg via SUBCUTANEOUS
  Filled 2019-11-02 (×2): qty 0.4

## 2019-11-02 NOTE — Plan of Care (Signed)
Patient ambulatory entire length of unit x 2 on 7 a to 7  p shift, epidural removed, pain managed on IV Dilaudid.  Tolerating clear liquids, states she has not passed any flatus but is burping.

## 2019-11-02 NOTE — Anesthesia Post-op Follow-up Note (Signed)
  Anesthesia Pain Follow-up Note  Patient: Pamela Horn  Day #: 2  Date of Follow-up: 11/02/2019 Time: 10:43 AM  Last Vitals:  Vitals:   11/02/19 0239 11/02/19 0537  BP: 127/78 119/77  Pulse: (!) 101 96  Resp: 19 18  Temp: 37.8 C 37.8 C  SpO2: 91% 92%    Level of Consciousness: alert  Pain: severe   Side Effects:None  Catheter Site Exam:clean, dry, no drainage  Epidural / Intrathecal (From admission, onward)   Start     Dose/Rate Route Frequency Ordered Stop   10/31/19 1045  ropivacaine (PF) 2 mg/mL (0.2%) (NAROPIN) injection     12 mL/hr 12 mL/hr  Epidural Continuous 10/31/19 1042         Plan: Catheter removed/tip intact at surgeon's request and D/C from anesthesia care at surgeon's request  Caileb Rhue,W. EDMOND

## 2019-11-02 NOTE — Progress Notes (Signed)
2 Days Post-Op Subjective: Complaining of generalized abdominal and right shoulder pain this morning.  She received a dose of Dilaudid at 8 AM, which partially alleviated her pain. She is not passing flatus or had a bowel movement yet.  She still has a Foley catheter in place.  She ambulated the halls yesterday without any issues.  Denies chest pain or shortness of breath  Objective: Vital signs in last 24 hours: Temp:  [98.9 F (37.2 C)-100 F (37.8 C)] 100 F (37.8 C) (04/24 0537) Pulse Rate:  [73-101] 96 (04/24 0537) Resp:  [18-20] 18 (04/24 0537) BP: (97-148)/(65-78) 119/77 (04/24 0537) SpO2:  [91 %-96 %] 92 % (04/24 0537)  Intake/Output from previous day: 04/23 0701 - 04/24 0700 In: 2880.1 [P.O.:480; I.V.:2242.2] Out: 2950 [Urine:2950]  Intake/Output this shift: No intake/output data recorded.  Physical Exam:  General: Alert and oriented CV: RRR, palpable distal pulses Lungs: CTAB, equal chest rise Abdomen: Soft, NTND, no rebound or guarding Incisions: Dressings removed.  Incisions are clean, dry and intact Gu: Foley catheter in place and draining clear-yellow urine Ext: NT, No erythema  Lab Results: Recent Labs    10/31/19 1536 11/01/19 0314 11/02/19 0343  HGB 12.1 10.4* 9.8*  HCT 38.2 32.4* 31.3*   BMET Recent Labs    11/01/19 0314 11/02/19 0343  NA 140 139  K 4.0 3.8  CL 107 106  CO2 25 25  GLUCOSE 127* 110*  BUN 10 6  CREATININE 0.93 0.81  CALCIUM 7.9* 8.0*     Studies/Results: No results found.  Assessment/Plan: 39 year old female with an XGP kidney, status post right simple nephrectomy on 10/31/2019 with Dr. Arita Miss.  -Dr. Sampson Goon with anesthesia is managing her epidural.  Hopefully they will be able to remove that later today.  Once the epidural is removed, she will need to be started on Lovenox for DVT prophylaxis -[facility] is managing her oral Suboxone -Continue clear liquid diet until she starts passing flatus -Out of bed to chair and  ambulate -Will continue to monitor   LOS: 2 days   Rhoderick Moody, MD [facility] Pager: [phone]  11/02/2019, 9:10 AM

## 2019-11-03 NOTE — Progress Notes (Signed)
3 Days Post-Op Subjective: No acute events overnight.  Pain marginally controlled with IV Dilaudid following epidural removal yesterday.  She is ambulating without difficulty.  Tolerating clears and denies nausea or vomiting over the past shift.  She is passing flatus, but has not had a bowel movement yet.  Objective: Vital signs in last 24 hours: Temp:  [98.5 F (36.9 C)-99.1 F (37.3 C)] 98.6 F (37 C) (04/25 0634) Pulse Rate:  [75-90] 75 (04/25 0634) Resp:  [16-20] 18 (04/25 0634) BP: (129-130)/(81-92) 129/92 (04/25 0634) SpO2:  [93 %-96 %] 93 % (04/25 0634)  Intake/Output from previous day: 04/24 0701 - 04/25 0700 In: 1199.1 [I.V.:1199.1] Out: 4950 [Urine:4950]  Intake/Output this shift: Total I/O In: 1343.3 [I.V.:1343.3] Out: -   Physical Exam:  General: Alert and oriented CV: RRR, palpable distal pulses Lungs: CTAB, equal chest rise Abdomen: Soft, NTND, no rebound or guarding Incisions: Clean, dry and intact Gu: Foley in place and draining clear-yellow urine Ext: NT, No erythema  Lab Results: Recent Labs    10/31/19 1536 11/01/19 0314 11/02/19 0343  HGB 12.1 10.4* 9.8*  HCT 38.2 32.4* 31.3*   BMET Recent Labs    11/01/19 0314 11/02/19 0343  NA 140 139  K 4.0 3.8  CL 107 106  CO2 25 25  GLUCOSE 127* 110*  BUN 10 6  CREATININE 0.93 0.81  CALCIUM 7.9* 8.0*     Studies/Results: No results found.  Assessment/Plan: 39 year old female with an XGP kidney, status post right simple nephrectomy on 10/31/2019 with Dr. Arita Miss.  -DC Foley catheter -Advance diet to regular -Out of bed to chair and ambulate -Continue Lovenox for DVT prophylaxis -We will await recommendations from [facility] for home pain medication regimen.  Continue IV Dilaudid and Suboxone as needed -Will continue to monitor   LOS: 3 days   Rhoderick Moody, MD [facility] Pager: [phone]  11/03/2019, 8:34 AM

## 2019-11-03 NOTE — Progress Notes (Signed)
Patient ambulated on hall way twice at this time, tolerated well.

## 2019-11-04 LAB — TYPE AND SCREEN
ABO/RH(D): B POS
Antibody Screen: NEGATIVE
Unit division: 0
Unit division: 0

## 2019-11-04 LAB — BPAM RBC
Blood Product Expiration Date: 202105172359
Blood Product Expiration Date: 202105182359
ISSUE DATE / TIME: 202104221423
ISSUE DATE / TIME: 202104221423
Unit Type and Rh: 7300
Unit Type and Rh: 7300

## 2019-11-04 LAB — SURGICAL PATHOLOGY

## 2019-11-04 NOTE — Discharge Instructions (Signed)
1.  Activity:  You are encouraged to ambulate frequently (about every hour during waking hours) to help prevent blood clots from forming in your legs or lungs.  However, you should not engage in any heavy lifting (> 10-15 lbs), strenuous activity, or straining. 2. Diet: You should advance your diet as instructed by your physician.  It will be normal to have some bloating, nausea, and abdominal discomfort intermittently. 3. Prescriptions:  You may take tylenol (acetaminophen) 1000mg  every 8 hours as needed for pain.   4. Incisions: You may remove your dressing bandages 48 hours after surgery if not removed in the hospital.  You will either have some small staples or special tissue glue at each of the incision sites. Once the bandages are removed (if present), the incisions may stay open to air.  You may start showering (but not soaking or bathing in water) the 2nd day after surgery and the incisions simply need to be patted dry after the shower.  No additional care is needed. 5. What to call about: You should call the office [phone]) if you develop fever > 101 or develop persistent vomiting.   You may resume aspirin, advil, aleve, vitamins, and supplements 7 days after surgery.           November 04, 2019  November 06, 2019. [place] [address] [place], [place]  To Whom It May Concern,  Please excuse Pamela Horn from work from 10/11/19 - 12/09/19 while they are under my clinical care.  If you have any questions, please let me know.  Sincerely,     12/11/19, M.D.

## 2019-11-04 NOTE — Progress Notes (Signed)
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  Tolerating general diet.  +Ambulating.  Pain improved this AM.   Active Problems:   XGP (xanthogranulomatous pyelonephritis)  Current Facility-Administered Medications  Medication Dose Route Frequency Provider Last Rate Last Admin  . buprenorphine-naloxone (SUBOXONE) 2-0.5 mg per SL tablet 1 tablet  1 tablet Sublingual Q8H Robley Fries, MD   1 tablet at 11/04/19 0703  . Chlorhexidine Gluconate Cloth 2 % PADS 6 each  6 each Topical Daily Robley Fries, MD   6 each at 11/02/19 [phone]  . cloNIDine (CATAPRES) tablet 0.1 mg  0.1 mg Oral Q8H PRN Dancy, Amanda, PA-C      . diphenhydrAMINE (BENADRYL) injection 12.5-25 mg  12.5-25 mg Intravenous Q6H PRN Debbrah Alar, PA-C   12.5 mg at 11/03/19 2322   Or  . diphenhydrAMINE (BENADRYL) 12.5 MG/5ML elixir 12.5-25 mg  12.5-25 mg Oral Q6H PRN Debbrah Alar, PA-C      . docusate sodium (COLACE) capsule 100 mg  100 mg Oral BID Debbrah Alar, PA-C   100 mg at 11/03/19 2311  . enoxaparin (LOVENOX) injection 40 mg  40 mg Subcutaneous Q24H Ceasar Mons, MD   40 mg at 11/03/19 2313  . HYDROmorphone (DILAUDID) injection 1 mg  1 mg Intravenous Q4H PRN Haskel Schroeder, MD   1 mg at 11/04/19 0703  . nicotine (NICODERM CQ - dosed in mg/24 hours) patch 21 mg  21 mg Transdermal Daily Jacalyn Lefevre D, MD   21 mg at 11/03/19 0859  . ondansetron (ZOFRAN) injection 4 mg  4 mg Intravenous Q4H PRN Debbrah Alar, PA-C   4 mg at 11/02/19 1752  . opium-belladonna (B&O) suppository 16.2-60mg   1 suppository Rectal Q6H PRN Debbrah Alar, PA-C         Objective: Vital: Vitals:   11/03/19 0634 11/03/19 1232 11/03/19 2032 11/04/19 0605  BP: (!) 129/92 135/86 130/79 140/85  Pulse: 75 74 73 62  Resp: 18 18 18 18   Temp: 98.6 F (37 C) 98 F (36.7  C) 99 F (37.2 C) 98.3 F (36.8 C)  TempSrc: Oral Oral Oral Oral  SpO2: 93% 98% 97% 95%  Weight:      Height:       I/Os: I/O last 3 completed shifts: In: 1793.3 [P.O.:450; I.V.:1343.3] Out: 5053 [Urine:3525]  Physical Exam:  General: Patient is in no apparent distress Lungs: Normal respiratory effort, chest expands symmetrically. GI: Incisions are c/d/i with staples,  abdomen is soft and appropriatley ttp Ext: lower extremities symmetric  Lab Results: Recent Labs    11/02/19 0343  WBC 7.6  HGB 9.8*  HCT 31.3*   Recent Labs    11/02/19 0343  NA 139  K 3.8  CL 106  CO2 25  GLUCOSE 110*  BUN 6  CREATININE 0.81  CALCIUM 8.0*   No results for input(s): LABPT, INR in the last 72 hours. No results for input(s): LABURIN in the last 72 hours. Results for orders placed or performed during the hospital encounter of 10/28/19  SARS CORONAVIRUS 2 (TAT 6-24 HRS) Nasopharyngeal Nasopharyngeal Swab     Status: None   Collection Time: 10/28/19  7:14 AM   Specimen: Nasopharyngeal Swab  Result Value Ref Range Status   SARS Coronavirus 2 NEGATIVE NEGATIVE Final    Comment: (NOTE) SARS-CoV-2 target nucleic acids are NOT DETECTED. The SARS-CoV-2 RNA is generally detectable  in upper and lower respiratory specimens during the acute phase of infection. Negative results do not preclude SARS-CoV-2 infection, do not rule out co-infections with other pathogens, and should not be used as the sole basis for treatment or other patient management decisions. Negative results must be combined with clinical observations, patient history, and epidemiological information. The expected result is Negative. Fact Sheet for Patients: HairSlick.no Fact Sheet for Healthcare Providers: quierodirigir.com This test is not yet approved or cleared by the [place] FDA and  has been authorized for detection and/or diagnosis of SARS-CoV-2 by FDA  under an Emergency Use Authorization (EUA). This EUA will remain  in effect (meaning this test can be used) for the duration of the COVID-19 declaration under Section 56 4(b)(1) of the Act, 21 U.S.C. section 360bbb-3(b)(1), unless the authorization is terminated or revoked sooner. Performed at [facility], [address], [place], [place] [address]     Studies/Results: No results found.  Assessment: 39 yo woman with RIGHT XGP kidney POD4 s/p HAND ASSISTED LAPAROSCOPIC RIGHT NEPHRECTOMY, 4 Days Post-Op  doing well.  Plan: -patient is ambulating and tolerating gen diet -will contact [facility] for her suboxone prior to dc -work note in chart -plan for dc home today  LOS: 4 days   Pamela Knudsen, MD Urology 11/04/2019, 7:52 AM

## 2019-11-04 NOTE — Discharge Summary (Signed)
Date of admission: 10/31/2019  Date of discharge: 11/04/2019  Admission diagnosis: XGP kidney  Discharge diagnosis: same  Secondary diagnoses:  Patient Active Problem List   Diagnosis Date Noted  . 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 Pyelonephritis 04/20/2019  . 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 Opioid use disorder ([place]) 04/20/2019  . Smoker 05/26/2014    Procedures performed: Procedure(s): HAND ASSISTED LAPAROSCOPIC RIGHT NEPHRECTOMY  History and Physical: For full details, please see admission history and physical. Briefly, Pamela Horn is a 39 y.o. year old patient with chronically infected right kidney who presents for right hand-assisted laparoscopic simple nephrectomy.   Hospital Course: Patient tolerated the procedure well.  She was then transferred to the floor after an uneventful PACU stay.  Her hospital course was uncomplicated.  On POD#4 she had met discharge criteria: was eating a regular diet, was up and ambulating independently,  pain was well controlled, was voiding without a catheter, and was ready to for discharge.   Laboratory values:  Recent Labs    11/02/19 0343  WBC 7.6  HGB 9.8*  HCT 31.3*   Recent Labs    11/02/19 0343  NA 139  K 3.8  CL 106  CO2 25  GLUCOSE 110*  BUN 6  CREATININE 0.81  CALCIUM 8.0*   No results for input(s): LABPT, INR in the last 72 hours. No results for input(s): LABURIN in the last 72 hours. Results for orders placed or performed during the hospital encounter of 10/28/19  SARS CORONAVIRUS 2 (TAT 6-24 HRS) Nasopharyngeal Nasopharyngeal Swab     Status: None   Collection Time: 10/28/19  7:14 AM   Specimen: Nasopharyngeal Swab  Result Value Ref Range Status   SARS Coronavirus 2 NEGATIVE NEGATIVE Final    Comment: (NOTE) SARS-CoV-2 target nucleic acids are NOT DETECTED. The SARS-CoV-2 RNA is generally detectable in upper and lower respiratory specimens during  the acute phase of infection. Negative results do not preclude SARS-CoV-2 infection, do not rule out co-infections with other pathogens, and should not be used as the sole basis for treatment or other patient management decisions. Negative results must be combined with clinical observations, patient history, and epidemiological information. The expected result is Negative. Fact Sheet for Patients: SugarRoll.be Fact Sheet for Healthcare Providers: https://www.woods-mathews.com/ This test is not yet approved or cleared by the [place] FDA and  has been authorized for detection and/or diagnosis of SARS-CoV-2 by FDA under an Emergency Use Authorization (EUA). This EUA will remain  in effect (meaning this test can be used) for the duration of the COVID-19 declaration under Section 56 4(b)(1) of the Act, 21 U.S.C. section 360bbb-3(b)(1), unless the authorization is terminated or revoked sooner. Performed at [facility], [place] [address] [place], [place] [address]     Disposition: Home  Discharge instruction: The patient was instructed to be ambulatory but told to refrain from heavy lifting, strenuous activity, or driving.   Discharge medications:  Allergies as of 11/04/2019      Reactions   Bactrim [sulfamethoxazole-trimethoprim] Itching   Redness, itching, burning, blotchy feet and hands      Medication List    STOP taking these medications   APPLE CIDER VINEGAR PO   BIOTIN PO   CALCIUM PO   cholecalciferol 25 MCG (1000 UNIT) tablet Commonly known as: VITAMIN D3     TAKE these medications   Buprenorphine HCl-Naloxone HCl 8-2 MG Film Place 0.25-0.5 Film under the tongue See admin instructions.  0.25 FILM under the tongue in the morning and 0.5 film at night   cephALEXin 500 MG capsule Commonly known as: KEFLEX Take 500 mg by mouth 3 (three) times daily.   cloNIDine 0.1 MG tablet Commonly known as: CATAPRES Take 0.1 mg  by mouth every 8 (eight) hours as needed (withdrawl).   DAYQUIL PO Take 1 tablet by mouth as needed (congestion).   oxybutynin 10 MG 24 hr tablet Commonly known as: Ditropan XL Take 1 tablet (10 mg total) by mouth daily.       Followup:  Follow-up Information    Robley Fries, MD On 11/14/2019.   Specialty: Urology Why: at 1:15 Contact information: [place] [address] [phone]

## 2019-12-10 ENCOUNTER — Emergency Department (HOSPITAL_COMMUNITY)
Admission: EM | Admit: 2019-12-10 | Discharge: 2019-12-10 | Disposition: A | Discharge status: Home / Self Care | Payer: Medicaid Other | Attending: Emergency Medicine | Admitting: Emergency Medicine

## 2019-12-10 ENCOUNTER — Other Ambulatory Visit: Payer: Self-pay

## 2019-12-10 DIAGNOSIS — Z5321 Procedure and treatment not carried out due to patient leaving prior to being seen by health care provider: Secondary | ICD-10-CM | POA: Diagnosis not present

## 2019-12-10 DIAGNOSIS — R1031 Right lower quadrant pain: Secondary | ICD-10-CM | POA: Diagnosis not present

## 2019-12-10 DIAGNOSIS — Z905 Acquired absence of kidney: Secondary | ICD-10-CM | POA: Diagnosis not present

## 2019-12-10 LAB — URINALYSIS, ROUTINE W REFLEX MICROSCOPIC
Bilirubin Urine: NEGATIVE
Glucose, UA: NEGATIVE mg/dL
Hgb urine dipstick: NEGATIVE
Ketones, ur: NEGATIVE mg/dL
Leukocytes,Ua: NEGATIVE
Nitrite: NEGATIVE
Protein, ur: NEGATIVE mg/dL
Specific Gravity, Urine: 1.004 — ABNORMAL LOW (ref 1.005–1.030)
pH: 6 (ref 5.0–8.0)

## 2019-12-10 NOTE — ED Notes (Signed)
Pt decided to call Dr Arita Miss to inform her of new pain symptom at right abdominal area.  Pt reports right kidney was removed April, 2021.  Pt reports being seen every week except last week for follow-up.

## 2020-09-01 ENCOUNTER — Emergency Department (HOSPITAL_COMMUNITY): Payer: Medicaid Other

## 2020-09-01 ENCOUNTER — Emergency Department (HOSPITAL_COMMUNITY)
Admission: EM | Admit: 2020-09-01 | Discharge: 2020-09-01 | Disposition: A | Discharge status: Home / Self Care | Payer: Medicaid Other | Attending: Emergency Medicine | Admitting: Emergency Medicine

## 2020-09-01 ENCOUNTER — Other Ambulatory Visit: Payer: Self-pay

## 2020-09-01 ENCOUNTER — Encounter (HOSPITAL_COMMUNITY): Payer: Self-pay | Admitting: *Deleted

## 2020-09-01 DIAGNOSIS — K439 Ventral hernia without obstruction or gangrene: Secondary | ICD-10-CM

## 2020-09-01 DIAGNOSIS — Z905 Acquired absence of kidney: Secondary | ICD-10-CM

## 2020-09-01 DIAGNOSIS — F17210 Nicotine dependence, cigarettes, uncomplicated: Secondary | ICD-10-CM | POA: Insufficient documentation

## 2020-09-01 DIAGNOSIS — R1030 Lower abdominal pain, unspecified: Secondary | ICD-10-CM | POA: Diagnosis present

## 2020-09-01 LAB — URINALYSIS, ROUTINE W REFLEX MICROSCOPIC
Bilirubin Urine: NEGATIVE
Glucose, UA: NEGATIVE mg/dL
Hgb urine dipstick: NEGATIVE
Ketones, ur: NEGATIVE mg/dL
Leukocytes,Ua: NEGATIVE
Nitrite: NEGATIVE
Protein, ur: NEGATIVE mg/dL
Specific Gravity, Urine: 1.009 (ref 1.005–1.030)
pH: 7 (ref 5.0–8.0)

## 2020-09-01 LAB — COMPREHENSIVE METABOLIC PANEL
ALT: 12 U/L (ref 0–44)
AST: 14 U/L — ABNORMAL LOW (ref 15–41)
Albumin: 3.7 g/dL (ref 3.5–5.0)
Alkaline Phosphatase: 47 U/L (ref 38–126)
Anion gap: 6 (ref 5–15)
BUN: 16 mg/dL (ref 6–20)
CO2: 27 mmol/L (ref 22–32)
Calcium: 8.3 mg/dL — ABNORMAL LOW (ref 8.9–10.3)
Chloride: 104 mmol/L (ref 98–111)
Creatinine, Ser: 0.75 mg/dL (ref 0.44–1.00)
GFR, Estimated: >60 mL/min (ref >60)
Glucose, Bld: 108 mg/dL — ABNORMAL HIGH (ref 70–99)
Potassium: 3.9 mmol/L (ref 3.5–5.1)
Sodium: 137 mmol/L (ref 135–145)
Total Bilirubin: 0.4 mg/dL (ref 0.3–1.2)
Total Protein: 6.8 g/dL (ref 6.5–8.1)

## 2020-09-01 LAB — CBC WITH DIFFERENTIAL/PLATELET
Abs Immature Granulocytes: 0.02 10*3/uL (ref 0.00–0.07)
Basophils Absolute: 0 10*3/uL (ref 0.0–0.1)
Basophils Relative: 0 %
Eosinophils Absolute: 0.1 10*3/uL (ref 0.0–0.5)
Eosinophils Relative: 2 %
HCT: 38 % (ref 36.0–46.0)
Hemoglobin: 12.5 g/dL (ref 12.0–15.0)
Immature Granulocytes: 0 %
Lymphocytes Relative: 37 %
Lymphs Abs: 1.8 10*3/uL (ref 0.7–4.0)
MCH: 28.2 pg (ref 26.0–34.0)
MCHC: 32.9 g/dL (ref 30.0–36.0)
MCV: 85.6 fL (ref 80.0–100.0)
Monocytes Absolute: 0.4 10*3/uL (ref 0.1–1.0)
Monocytes Relative: 8 %
Neutro Abs: 2.6 10*3/uL (ref 1.7–7.7)
Neutrophils Relative %: 53 %
Platelets: 231 10*3/uL (ref 150–400)
RBC: 4.44 MIL/uL (ref 3.87–5.11)
RDW: 12.2 % (ref 11.5–15.5)
WBC: 4.9 10*3/uL (ref 4.0–10.5)
nRBC: 0 % (ref 0.0–0.2)

## 2020-09-01 LAB — LIPASE, BLOOD: Lipase: 45 U/L (ref 11–51)

## 2020-09-01 MED ORDER — ACETAMINOPHEN 500 MG PO TABS
1000.0000 mg | ORAL_TABLET | Freq: Once | ORAL | Status: AC
  Administered 2020-09-01: 1000 mg via ORAL
  Filled 2020-09-01: qty 2

## 2020-09-01 MED ORDER — IOHEXOL 300 MG/ML  SOLN
100.0000 mL | Freq: Once | INTRAMUSCULAR | Status: AC | PRN
  Administered 2020-09-01: 100 mL via INTRAVENOUS

## 2020-09-01 MED ORDER — MORPHINE SULFATE (PF) 4 MG/ML IV SOLN
4.0000 mg | Freq: Once | INTRAVENOUS | Status: DC
  Filled 2020-09-01: qty 1

## 2020-09-01 NOTE — ED Triage Notes (Signed)
Abdominal pain for months after kidney removed, c/o bulge in abdominal area

## 2020-09-01 NOTE — ED Notes (Signed)
Patient transported to CT 

## 2020-09-01 NOTE — ED Provider Notes (Signed)
[facility]ERGENCY DEPARTMENT Provider Note   CSN: [id number] Arrival date & time: 09/01/20  1832     History Chief Complaint  Patient presents with  . Abdominal Pain    Pamela Horn is a 40 y.o. 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 This began this morning when she got up to use the bathroom. Located above her surgical incision in right mid/lower abdomen. Feels like an ache, pressure milder at rest but sharp and severe radiating to entire abdomen with movement, taking deep breaths, walking. At its worse the pain is 10/10.  Reports having a "bulge" in this area immediately after the surgery was done in 2021. This has gradually gotten bigger. States it is bigger if she is constipated and has not had a bowel movement. Reports last BM 2 days ago, before that she had gone 7 days without having a BM.  She has been passing gas since last BM. Denies fever, nausea, vomiting, diarrhea, dysuria or changes in urine. Reports feeling "pressure" in her right abdomen when urinating. No interventions. No abnormal vaginal discharge or bleeding. H/o cesarean but no other abdominal surgeries.   HPI     Past Medical History:  Diagnosis Date  . History of kidney stones   . Pre-eclampsia    1st pregnancy  . Pregnancy induced hypertension     Patient Active Problem List   Diagnosis Date Noted  . 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 Pyelonephritis 04/20/2019  . 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 Opioid use disorder 04/20/2019  . Smoker 05/26/2014    Past Surgical History:  Procedure Laterality Date  . CESAREAN SECTION  10/18/1999  . CESAREAN SECTION N/A 12/02/2014   Procedure: CESAREAN SECTION (MULTIPLE) REPEAT;  Surgeon: Lazaro ArmsLuther H Eure, MD;  Location: [facility];  Service: Obstetrics;  Laterality: N/A;  .  COLPOSCOPY  04/04/2000 & 10/03/2000  . CYSTOSCOPY WITH RETROGRADE PYELOGRAM, URETEROSCOPY AND STENT PLACEMENT Right 05/14/2019   Procedure: CYSTOSCOPY WITH RETROGRADE PYELOGRAM, URETEROSCOPY AND STENT PLACEMENT;  Surgeon: Noel ChristmasPace, Maryellen D, MD;  [facility];  Service: Urology;  Laterality: Right;  90 MINS  . CYSTOSCOPY/URETEROSCOPY/HOLMIUM LASER/STENT PLACEMENT Right 05/28/2019   Procedure: CYSTOSCOPY/URETEROSCOPY/HOLMIUM LASER/STENT PLACEMENT;  Surgeon: Noel ChristmasPace, Maryellen D, MD;  Location: [facility];  Service: Urology;  Laterality: Right;  90 MINS  . GYNECOLOGIC CRYOSURGERY  11/07/2000  . HOLMIUM LASER APPLICATION Right 05/14/2019   Procedure: HOLMIUM LASER APPLICATION;  Surgeon: Noel ChristmasPace, Maryellen D, MD;  [facility];  Service: Urology;  Laterality: Right;  . IR NEPHROSTOMY PLACEMENT RIGHT  04/21/2019  . LAPAROSCOPIC BILATERAL SALPINGECTOMY Bilateral 02/25/2015   Procedure: LAPAROSCOPIC BILATERAL SALPINGECTOMY;  Surgeon: Lazaro ArmsLuther H Eure, MD;  Location: [facility];  Service: Gynecology;  Laterality: Bilateral;  . LAPAROSCOPIC NEPHRECTOMY, HAND ASSISTED Right 10/31/2019   Procedure: HAND ASSISTED LAPAROSCOPIC RIGHT NEPHRECTOMY;  Surgeon: Noel ChristmasPace, Maryellen D, MD;  Location: [facility];  Service: Urology;  Laterality: Right;     OB History    Gravida  2   Para  2   Term  1   Preterm  1   AB      Living  3     SAB      IAB      Ectopic      Multiple  1   Live Births  1  Family History  Problem Relation Age of Onset  . Hypertension Mother   . Cancer Father        melanoma  . Cancer Sister   . Diabetes Paternal Grandfather     Social History   Tobacco Use  . Smoking status: Current Every Day Smoker    Packs/day: 1.00    Years: 24.00    Pack years: 24.00    Types: Cigarettes  . Smokeless tobacco: Never Used  Vaping Use  . Vaping Use: Never used  Substance Use Topics  . Alcohol use: No  . Drug use: No    Home  Medications Prior to Admission medications   Medication Sig Start Date End Date Taking? Authorizing Provider  Buprenorphine HCl-Naloxone HCl 8-2 MG FILM Place 0.25-0.5 Film under the tongue See admin instructions. 0.25 FILM under the tongue in the morning and 0.5 film at night 03/27/18   [provider]  cephALEXin (KEFLEX) 500 MG capsule Take 500 mg by mouth 3 (three) times daily.     [provider]  cloNIDine (CATAPRES) 0.1 MG tablet Take 0.1 mg by mouth every 8 (eight) hours as needed (withdrawl).  10/28/19   [provider]  Pseudoephedrine-APAP-DM (DAYQUIL PO) Take 1 tablet by mouth as needed (congestion).    [provider]    Allergies    Bactrim [sulfamethoxazole-trimethoprim]  Review of Systems   Review of Systems  Gastrointestinal: Positive for abdominal pain.  All other systems reviewed and are negative.   Physical Exam Updated Vital Signs BP 110/63 (BP Location: Left Arm)   Pulse 65   Temp 97.8 F (36.6 C) (Oral)   Resp 17   Ht 5\' 3"  (1.6 m)   Wt 81.6 kg   SpO2 97%   BMI 31.89 kg/m   Physical Exam Vitals and nursing note reviewed.  Constitutional:      Appearance: She is well-developed.     Comments: Non toxic in NAD  HENT:     Head: Normocephalic and atraumatic.     Nose: Nose normal.  Eyes:     Conjunctiva/sclera: Conjunctivae normal.  Cardiovascular:     Rate and Rhythm: Normal rate and regular rhythm.  Pulmonary:     Effort: Pulmonary effort is normal.     Breath sounds: Normal breath sounds.  Abdominal:     General: Bowel sounds are normal.     Palpations: Abdomen is soft.     Tenderness: There is abdominal tenderness (right mid abdomen/RLQ) in the right lower quadrant. Positive signs include McBurney's sign.     Hernia: A hernia (questionble non tender hernia above surgical incision from right nephrectomy. normal skin) is present.     Comments: No G/R/R. No suprapubic or CVA tenderness. Negative Murphy's. Active  BS to lower quadrants.   Musculoskeletal:        General: Normal range of motion.     Cervical back: Normal range of motion.  Skin:    General: Skin is warm and dry.     Capillary Refill: Capillary refill takes less than 2 seconds.  Neurological:     Mental Status: She is alert.  Psychiatric:        Behavior: Behavior normal.     ED Results / Procedures / Treatments   Labs (all labs ordered are listed, but only abnormal results are displayed) Labs Reviewed  COMPREHENSIVE METABOLIC PANEL - Abnormal; Notable for the following components:      Result Value   Glucose, Bld 108 (*)  Calcium 8.3 (*)    AST 14 (*)    All other components within normal limits  URINALYSIS, ROUTINE W REFLEX MICROSCOPIC - Abnormal; Notable for the following components:   Color, Urine STRAW (*)    All other components within normal limits  CBC WITH DIFFERENTIAL/PLATELET  LIPASE, BLOOD    EKG None  Radiology CT ABDOMEN PELVIS W CONTRAST  Result Date: 09/01/2020 CLINICAL DATA:  40 year old female with abdominal pain. EXAM: CT ABDOMEN AND PELVIS WITH CONTRAST TECHNIQUE: Multidetector CT imaging of the abdomen and pelvis was performed using the standard protocol following bolus administration of intravenous contrast. CONTRAST:  OMNIPAQUE IOHEXOL 300 MG/ML  SOLN COMPARISON:  CT abdomen pelvis dated 12/20/2019. FINDINGS: Lower chest: Minimal bibasilar atelectasis. The visualized lung bases are otherwise clear. No intra-abdominal free air.  Small free fluid in the pelvis. Hepatobiliary: No focal liver abnormality is seen. No gallstones, gallbladder wall thickening, or biliary dilatation. Pancreas: Unremarkable. No pancreatic ductal dilatation or surrounding inflammatory changes. Spleen: Normal in size without focal abnormality. Adrenals/Urinary Tract: The adrenal glands unremarkable. Status post prior right nephrectomy. No fluid collection or abnormal mass in the nephrectomy bed. The left kidney is  unremarkable. The visualized left ureter and urinary bladder appear unremarkable. Stomach/Bowel: There is no bowel obstruction or active inflammation. The appendix is normal. Vascular/Lymphatic: The abdominal aorta and IVC unremarkable. There is a retroaortic left renal vein anatomy. No portal venous gas. There is no adenopathy. Reproductive: The uterus is anteverted and grossly unremarkable. No adnexal masses. Small left ovarian corpus luteum. Other: Small midline ventral supraumbilical fat containing hernia new since the prior CT. No fluid collection. Musculoskeletal: Degenerative changes of the spine. No acute osseous pathology. IMPRESSION: 1. No acute intra-abdominal or pelvic pathology. No bowel obstruction. Normal appendix. 2. Status post prior right nephrectomy. No fluid collection or abnormal mass in the nephrectomy bed. Electronically Signed   By: Elgie Collard M.D.   On: 09/01/2020 22:28    Procedures Procedures   Medications Ordered in ED Medications  acetaminophen (TYLENOL) tablet 1,000 mg (1,000 mg Oral Given 09/01/20 1952)  iohexol (OMNIPAQUE) 300 MG/ML solution 100 mL (100 mLs Intravenous Contrast Given 09/01/20 2146)    ED Course  I have reviewed the triage vital signs and the nursing notes.  Pertinent labs & imaging results that were available during my care of the patient were reviewed by me and considered in my medical decision making (see chart for details).  Clinical Course as of 09/01/20 2238  Tue Sep 01, 2020  2233 CT ABDOMEN PELVIS W CONTRAST Other: Small midline ventral supraumbilical fat containing hernia new since the prior CT. No fluid collection.  [CG]  2233 CT ABDOMEN PELVIS W CONTRAST IMPRESSION: 1. No acute intra-abdominal or pelvic pathology. No bowel obstruction. Normal appendix. 2. Status post prior right nephrectomy. No fluid collection or abnormal mass in the nephrectomy bed. [CG]    Clinical Course User Index [CG] Jerrell Mylar   MDM  Rules/Calculators/A&P                           40 y.o. yo with chief complaint of right mid/lower abdominal pain.  History of right nephrectomy April 2021.  Previous medical records available, triage and nursing notes reviewed to obtain more history and assist with MDM  Additional information obtained from medical record review,  op note  Chief complain involves an extensive number of treatment options and is a complaint that carries  with it a high risk of complications and morbidity and mortality.    Differential diagnosis: Symptomatic hernia, hernia complication like strangulation, obstruction, right-sided diverticulitis, appendicitis.  On exam she has a very small ventral hernia that does not appear to be acutely strangulated, easily reducible however.  Postop complication less likely given she is now 10 months postop.  She has no vaginal symptoms to suggest pelvic etiology noted on think emergent pelvic/vaginal exam is necessary today.  ER lab work and imaging ordered by triage RN and me, as above  I have personally visualized and interpreted ER diagnostic work up including labs and imaging.    Labs reveal - All normal.  Normal WBC, creatinine, LFTs, lipase.  Urinalysis without any RBCs or infection.   Imaging reveals -given location of the pain, CT A/P was obtained.  There is a new, small fat-containing ventral hernia without complicating features.  No other acute intra-abdominal or pelvic pathology.  No fluid collection or abnormal mass in the nephrectomy bed.  Medications ordered -Tylenol, patient is on Suboxone  Ordered continuous cardiac and pulse ox monitoring.  Will plan for serial re-examinations. Close monitoring.   2235: Re-evaluated the patient.   Discussed lab work and CT findings.  Pain has improved.  Recommended OTC NSAIDs and follow-up with urologist perform surgery.  Return precautions discussed.  Patient is comfortable with the plan.  Final Clinical Impression(s) /  ED Diagnoses Final diagnoses:  Ventral hernia without obstruction or gangrene  History of nephrectomy, right    Rx / DC Orders ED Discharge Orders    None       Jerrell Mylar 09/01/20 2238    Bethann Berkshire, MD 09/02/20 1520

## 2020-09-01 NOTE — Discharge Instructions (Signed)
You were seen in the emergency department for abdominal pain  Lab work, urinalysis were all normal.  Your kidney function is normal  CT scan showed a very small hernia that is only containing fat.  There is no bowel or any other organs within the hernia.  Hernias can cause some discomfort and pain especially if the grow bigger.  At this time there is no signs of a complication of a hernia.  Return to the ED if you notice significant bulging of your hernia, severe constant pain with nausea, vomiting or inability to have a bowel movement, discoloration of the skin  At this time we can attribute your pain to the hernia as everything else was normal  You may take ibuprofen or acetaminophen as needed for pain  I recommend follow-up with your urologist/surgeon in the next 1 week for further discussion of your symptoms

## 2020-10-28 ENCOUNTER — Other Ambulatory Visit (HOSPITAL_COMMUNITY)
Admission: RE | Admit: 2020-10-28 | Discharge: 2020-10-28 | Disposition: A | Discharge status: Home / Self Care | Payer: Medicaid Other | Source: Ambulatory Visit | Attending: Adult Health | Admitting: Adult Health

## 2020-10-28 ENCOUNTER — Other Ambulatory Visit: Payer: Self-pay

## 2020-10-28 ENCOUNTER — Ambulatory Visit (INDEPENDENT_AMBULATORY_CARE_PROVIDER_SITE_OTHER): Payer: Medicaid Other | Admitting: Adult Health

## 2020-10-28 ENCOUNTER — Encounter: Payer: Self-pay | Admitting: Adult Health

## 2020-10-28 VITALS — BP 117/82 | HR 66 | Ht 62.0 in | Wt 176.6 lb

## 2020-10-28 DIAGNOSIS — Z0000 Encounter for general adult medical examination without abnormal findings: Secondary | ICD-10-CM | POA: Diagnosis not present

## 2020-10-28 DIAGNOSIS — Z01419 Encounter for gynecological examination (general) (routine) without abnormal findings: Secondary | ICD-10-CM | POA: Diagnosis present

## 2020-10-28 DIAGNOSIS — Z1231 Encounter for screening mammogram for malignant neoplasm of breast: Secondary | ICD-10-CM

## 2020-10-28 DIAGNOSIS — Z1211 Encounter for screening for malignant neoplasm of colon: Secondary | ICD-10-CM

## 2020-10-28 LAB — HEMOCCULT GUIAC POC 1CARD (OFFICE): Fecal Occult Blood, POC: NEGATIVE

## 2020-10-28 NOTE — Progress Notes (Signed)
Patient ID: Pamela Horn, female   DOB: [birth year], 40 y.o.   MRN: [id number] History of Present Illness: Danai is a 40 year old white female,single, G3P2103 in for a well woman gyn exam and pap. She declines to stop smoking at this time. PCP is Dr Sudie Bailey.   Current Medications, Allergies, Past Medical History, Past Surgical History, Family History and Social History were reviewed in [employer] record.     Review of Systems: Patient denies any headaches, hearing loss, fatigue, blurred vision, shortness of breath, chest pain, abdominal pain, problems with bowel movements, urination, or intercourse. No joint pain or mood swings.    Physical Exam:BP 117/82 (BP Location: Right Arm, Patient Position: Sitting, Cuff Size: Normal)   Pulse 66   Ht 5\' 2"  (1.575 m)   Wt 176 lb 9.6 oz (80.1 kg)   LMP 09/29/2020 (Exact Date)   BMI 32.30 kg/m  General:  Well developed, well nourished, no acute distress Skin:  Warm and dry Neck:  Midline trachea, normal thyroid, good ROM, no lymphadenopathy Lungs; Clear to auscultation bilaterally Breast:  No dominant palpable mass, retraction, or nipple discharge Cardiovascular: Regular rate and rhythm Abdomen:  Soft, non tender, no hepatosplenomegaly Pelvic:  External genitalia is normal in appearance, no lesions.  The vagina is normal in appearance. Urethra has no lesions or masses. The cervix is bulbous.Pap with HR HPV genotyping performed.  Uterus is felt to be normal size, shape, and contour.  No adnexal masses or tenderness noted.Bladder is non tender, no masses felt. Rectal: Good sphincter tone, no polyps, or hemorrhoids felt.  Hemoccult negative. Extremities/musculoskeletal:  No swelling or varicosities noted, no clubbing or cyanosis Psych:  No mood changes, alert and cooperative,seems happy AA is 0 Fall risk is low PHQ 9 score is 0 GAD 7 score is 0  Upstream - 10/28/20 1534      Pregnancy Intention Screening   Does the  patient want to become pregnant in the next year? No    Does the patient's partner want to become pregnant in the next year? No    Would the patient like to discuss contraceptive options today? No      Contraception Wrap Up   Current Method Female Sterilization    End Method Female Sterilization    Contraception Counseling Provided No         Examination chaperoned by 10/30/20 RN  Impression and Plan: 1. Encounter for gynecological examination with Papanicolaou smear of cervix Pap sent Physical in 1 year Pap in 3 if normal  2. Encounter for screening fecal occult blood testing   3. Screening mammogram for breast cancer Mammogram scheduled for her 4/25/ at 11:45 am at [facility]

## 2020-10-30 LAB — CYTOLOGY - PAP
Comment: NEGATIVE
Diagnosis: NEGATIVE
High risk HPV: NEGATIVE

## 2020-11-02 ENCOUNTER — Ambulatory Visit (HOSPITAL_COMMUNITY)
Admission: RE | Admit: 2020-11-02 | Discharge: 2020-11-02 | Disposition: A | Discharge status: Home / Self Care | Payer: Medicaid Other | Source: Ambulatory Visit | Attending: Adult Health | Admitting: Adult Health

## 2020-11-02 ENCOUNTER — Other Ambulatory Visit: Payer: Self-pay

## 2020-11-02 DIAGNOSIS — Z1231 Encounter for screening mammogram for malignant neoplasm of breast: Secondary | ICD-10-CM | POA: Insufficient documentation

## 2020-11-03 ENCOUNTER — Other Ambulatory Visit (HOSPITAL_COMMUNITY): Payer: Self-pay | Admitting: Adult Health

## 2020-11-03 DIAGNOSIS — R928 Other abnormal and inconclusive findings on diagnostic imaging of breast: Secondary | ICD-10-CM

## 2020-11-04 ENCOUNTER — Telehealth: Payer: Self-pay | Admitting: Adult Health

## 2020-11-04 ENCOUNTER — Encounter: Payer: Self-pay | Admitting: Adult Health

## 2020-11-04 DIAGNOSIS — R928 Other abnormal and inconclusive findings on diagnostic imaging of breast: Secondary | ICD-10-CM | POA: Insufficient documentation

## 2020-11-04 NOTE — Telephone Encounter (Signed)
Pt informed of possible right breast mass will need diagnotic right mammogram and possible [place], pt to call xray for this appointment

## 2020-11-10 ENCOUNTER — Ambulatory Visit (HOSPITAL_COMMUNITY)
Admission: RE | Admit: 2020-11-10 | Discharge: 2020-11-10 | Disposition: A | Discharge status: Home / Self Care | Payer: Medicaid Other | Source: Ambulatory Visit | Attending: Adult Health | Admitting: Adult Health

## 2020-11-10 ENCOUNTER — Other Ambulatory Visit: Payer: Self-pay

## 2020-11-10 DIAGNOSIS — R928 Other abnormal and inconclusive findings on diagnostic imaging of breast: Secondary | ICD-10-CM

## 2021-05-14 ENCOUNTER — Other Ambulatory Visit: Payer: Self-pay

## 2021-05-14 ENCOUNTER — Emergency Department (HOSPITAL_COMMUNITY): Payer: Medicaid Other

## 2021-05-14 ENCOUNTER — Encounter (HOSPITAL_COMMUNITY): Payer: Self-pay | Admitting: *Deleted

## 2021-05-14 ENCOUNTER — Emergency Department (HOSPITAL_COMMUNITY)
Admission: EM | Admit: 2021-05-14 | Discharge: 2021-05-14 | Disposition: A | Discharge status: Home / Self Care | Payer: Medicaid Other | Attending: Emergency Medicine | Admitting: Emergency Medicine

## 2021-05-14 DIAGNOSIS — F17210 Nicotine dependence, cigarettes, uncomplicated: Secondary | ICD-10-CM | POA: Diagnosis not present

## 2021-05-14 DIAGNOSIS — R109 Unspecified abdominal pain: Secondary | ICD-10-CM | POA: Diagnosis present

## 2021-05-14 LAB — URINALYSIS, ROUTINE W REFLEX MICROSCOPIC
Bacteria, UA: NONE SEEN
Bilirubin Urine: NEGATIVE
Glucose, UA: NEGATIVE mg/dL
Ketones, ur: NEGATIVE mg/dL
Leukocytes,Ua: NEGATIVE
Nitrite: NEGATIVE
Protein, ur: NEGATIVE mg/dL
Specific Gravity, Urine: 1.009 (ref 1.005–1.030)
pH: 6 (ref 5.0–8.0)

## 2021-05-14 LAB — CBC WITH DIFFERENTIAL/PLATELET
Abs Immature Granulocytes: 0.03 10*3/uL (ref 0.00–0.07)
Basophils Absolute: 0 10*3/uL (ref 0.0–0.1)
Basophils Relative: 1 %
Eosinophils Absolute: 0.2 10*3/uL (ref 0.0–0.5)
Eosinophils Relative: 3 %
HCT: 40.7 % (ref 36.0–46.0)
Hemoglobin: 13.3 g/dL (ref 12.0–15.0)
Immature Granulocytes: 1 %
Lymphocytes Relative: 27 %
Lymphs Abs: 1.4 10*3/uL (ref 0.7–4.0)
MCH: 28.2 pg (ref 26.0–34.0)
MCHC: 32.7 g/dL (ref 30.0–36.0)
MCV: 86.4 fL (ref 80.0–100.0)
Monocytes Absolute: 0.4 10*3/uL (ref 0.1–1.0)
Monocytes Relative: 8 %
Neutro Abs: 3.1 10*3/uL (ref 1.7–7.7)
Neutrophils Relative %: 60 %
Platelets: 218 10*3/uL (ref 150–400)
RBC: 4.71 MIL/uL (ref 3.87–5.11)
RDW: 12.3 % (ref 11.5–15.5)
WBC: 5.1 10*3/uL (ref 4.0–10.5)
nRBC: 0 % (ref 0.0–0.2)

## 2021-05-14 LAB — BASIC METABOLIC PANEL
Anion gap: 7 (ref 5–15)
BUN: 14 mg/dL (ref 6–20)
CO2: 27 mmol/L (ref 22–32)
Calcium: 9 mg/dL (ref 8.9–10.3)
Chloride: 104 mmol/L (ref 98–111)
Creatinine, Ser: 0.88 mg/dL (ref 0.44–1.00)
GFR, Estimated: >60 mL/min (ref >60)
Glucose, Bld: 99 mg/dL (ref 70–99)
Potassium: 4.2 mmol/L (ref 3.5–5.1)
Sodium: 138 mmol/L (ref 135–145)

## 2021-05-14 LAB — PREGNANCY, URINE: Preg Test, Ur: NEGATIVE

## 2021-05-14 MED ORDER — KETOROLAC TROMETHAMINE 30 MG/ML IJ SOLN
15.0000 mg | Freq: Once | INTRAMUSCULAR | Status: AC
  Administered 2021-05-14: 15 mg via INTRAMUSCULAR
  Filled 2021-05-14: qty 1

## 2021-05-14 NOTE — ED Notes (Addendum)
Patient angry and crying about length of time in ED today. Requesting to go outside and smoke and come back. When told she could not she started cussing this Clinical research associate and states to get "this shit off me" referring to monitoring equipment. States that she is leaving. Nicotine patch offered, patient refused. Dr. Jeraldine Loots made aware.

## 2021-05-14 NOTE — ED Notes (Signed)
Patient walked out at this time.

## 2021-05-14 NOTE — ED Provider Notes (Addendum)
[facility] EMERGENCY DEPARTMENT Provider Note   CSN: [id number] Arrival date & time: 05/14/21  0844     History Chief Complaint  Patient presents with   Flank Pain    Pamela Horn is a 40 y.o. female.  HPI Presents with flank pain, dysuria.  Onset was about 6 days ago, gradual.  Pain was most severe at that time, but has been persistent, in the left and right flank since.  No medication taken for relief.  No abdominal pain, chest pain, vomiting, nausea, fever. She has not taken any medication for relief.  She did begin utilizing an antibiotic provided to her by a friend for presumed UTI.  History includes UTI, kidney stones.     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   Pyelonephritis 04/20/2019   Hydronephrosis with renal and ureteral calculus obstruction 04/20/2019   Opioid use disorder 04/20/2019   Encounter for gynecological examination with Papanicolaou smear of cervix 04/24/2018   Smoker 05/26/2014    Past Surgical History:  Procedure Laterality Date   CESAREAN SECTION  10/18/1999   CESAREAN SECTION N/A 12/02/2014   Procedure: CESAREAN SECTION (MULTIPLE) REPEAT;  Surgeon: Lazaro Arms, MD;  Location: [facility];  Service: Obstetrics;  Laterality: N/A;   COLPOSCOPY  04/04/2000 & 10/03/2000   CYSTOSCOPY WITH RETROGRADE PYELOGRAM, URETEROSCOPY AND STENT PLACEMENT Right 05/14/2019   Procedure: CYSTOSCOPY WITH RETROGRADE PYELOGRAM, URETEROSCOPY AND STENT PLACEMENT;  Surgeon: Noel Christmas, MD;  Location[facility];  Service: Urology;  Laterality: Right;  90 MINS    CYSTOSCOPY/URETEROSCOPY/HOLMIUM LASER/STENT PLACEMENT Right 05/28/2019   Procedure: CYSTOSCOPY/URETEROSCOPY/HOLMIUM LASER/STENT PLACEMENT;  Surgeon: Noel Christmas, MD;  Location: [facility];  Service: Urology;  Laterality: Right;  90 MINS   GYNECOLOGIC CRYOSURGERY  11/07/2000   HOLMIUM LASER APPLICATION Right 05/14/2019   Procedure: HOLMIUM LASER APPLICATION;  Surgeon: Noel Christmas, MD;  Location: [facility];  Service: Urology;  Laterality: Right;   IR NEPHROSTOMY PLACEMENT RIGHT  04/21/2019   LAPAROSCOPIC BILATERAL SALPINGECTOMY Bilateral 02/25/2015   Procedure: LAPAROSCOPIC BILATERAL SALPINGECTOMY;  Surgeon: Lazaro Arms, MD;  Location: [facility];  Service: Gynecology;  Laterality: Bilateral;   LAPAROSCOPIC NEPHRECTOMY, HAND ASSISTED Right 10/31/2019   Procedure: HAND ASSISTED LAPAROSCOPIC RIGHT NEPHRECTOMY;  Surgeon: Noel Christmas, MD;  Location: [facility];  Service: Urology;  Laterality: Right;     OB History     Gravida  3   Para  3   Term  2   Preterm  1   AB      Living  3      SAB      IAB      Ectopic      Multiple  1   Live Births  3           Family History  Problem Relation Age of Onset   Hypertension Mother    Cancer Father        melanoma   Cancer Sister    Diabetes Paternal Grandfather     Social History   Tobacco Use   Smoking status: Every Day  Packs/day: 1.00    Years: 24.00    Pack years: 24.00    Types: Cigarettes   Smokeless tobacco: Never  Vaping Use   Vaping Use: Never used  Substance Use Topics   Alcohol use: No   Drug use: No    Home Medications Prior to Admission medications   Medication Sig Start Date End Date Taking? Authorizing Provider  Buprenorphine HCl-Naloxone HCl 8-2 MG FILM Place 0.25-0.5 Film under the tongue See admin instructions. 0.25 FILM under the tongue in the morning and 0.5 film at night 03/27/18   [provider]  Multiple Vitamin (MULTIVITAMIN) tablet Take 1 tablet by mouth  daily.    [provider]    Allergies    Bactrim [sulfamethoxazole-trimethoprim]  Review of Systems   Review of Systems  Constitutional:        Per HPI, otherwise negative  HENT:         Per HPI, otherwise negative  Respiratory:         Per HPI, otherwise negative  Cardiovascular:        Per HPI, otherwise negative  Gastrointestinal:  Negative for vomiting.  Endocrine:       Negative aside from HPI  Genitourinary:        Neg aside from HPI   Musculoskeletal:        Per HPI, otherwise negative  Skin: Negative.   Neurological:  Negative for syncope.   Physical Exam Updated Vital Signs BP (!) 151/107 (BP Location: Right Arm)   Pulse 86   Temp 97.7 F (36.5 C) (Oral)   Resp 16   Ht 5\' 3"  (1.6 m)   Wt 83 kg   SpO2 100%   BMI 32.42 kg/m   Physical Exam Vitals and nursing note reviewed.  Constitutional:      General: She is not in acute distress.    Appearance: She is well-developed. She is obese.  HENT:     Head: Normocephalic and atraumatic.  Eyes:     Conjunctiva/sclera: Conjunctivae normal.  Cardiovascular:     Rate and Rhythm: Normal rate and regular rhythm.  Pulmonary:     Effort: Pulmonary effort is normal. No respiratory distress.     Breath sounds: Normal breath sounds. No stridor.  Abdominal:     General: There is no distension.     Tenderness: There is no abdominal tenderness. There is no right CVA tenderness, left CVA tenderness or guarding.  Skin:    General: Skin is warm and dry.  Neurological:     Mental Status: She is alert and oriented to person, place, and time.     Cranial Nerves: No cranial nerve deficit.    ED Results / Procedures / Treatments   Labs (all labs ordered are listed, but only abnormal results are displayed) Labs Reviewed  BASIC METABOLIC PANEL  CBC WITH DIFFERENTIAL/PLATELET  URINALYSIS, ROUTINE W REFLEX MICROSCOPIC  I-STAT BETA HCG BLOOD, ED ([facility], [facility], [facility] ONLY)    EKG None  Radiology No results  found.  Procedures Procedures   Medications Ordered in ED Medications - No data to display  ED Course  I have reviewed the triage vital signs and the nursing notes.  Pertinent labs & imaging results that were available during my care of the patient were reviewed by me and considered in my medical decision making (see chart for details). Update: Patient left, stating that she wanted to smoke a cigarette.  Initial findings were reassuring, vital signs reassuring, but with  her history of kidney stones, she was awaiting CT scan results. On review, CT scan without evidence for new stone.  Labs reassuring, urinalysis reassuring. MDM Number of Diagnoses or Management Options Flank pain: new, needed workup   Amount and/or Complexity of Data Reviewed Clinical lab tests: ordered and reviewed Tests in the radiology section of CPT: ordered and reviewed Tests in the medicine section of CPT: reviewed and ordered Decide to obtain previous medical records or to obtain history from someone other than the patient: yes Review and summarize past medical records: yes Independent visualization of images, tracings, or specimens: yes  Risk of Complications, Morbidity, and/or Mortality Presenting problems: high Diagnostic procedures: high Management options: high  Critical Care Total time providing critical care: < 30 minutes  Patient Progress Patient progress: stable   Final Clinical Impression(s) / ED Diagnoses Final diagnoses:  Flank pain       Gerhard Munch, MD 05/14/21 1617    Gerhard Munch, MD 05/14/21 1617

## 2021-05-14 NOTE — ED Triage Notes (Signed)
Pt states she started having left flank pain x 6 days that has gotten progressively worse; pt states she has had a kidney infection to the right kidney and had to have it removed

## 2021-06-05 IMAGING — CT CT RENAL STONE PROTOCOL
2 of 4 series · 15 of 46 positions shown, 17 images · non-contrast
Comparison: September 06, 2008

CLINICAL DATA: Right-sided flank pain for 1 day.

EXAM:
CT ABDOMEN AND PELVIS WITHOUT CONTRAST
TECHNIQUE: Multidetector CT imaging of the abdomen and pelvis was performed
following the standard protocol without IV contrast.

[Series 2: axial st · axial · 0.70mm/px · z∈[-741,-311]mm · 12 of 98 slices shown, 14 images]
[im 6/98  soft-tissue]
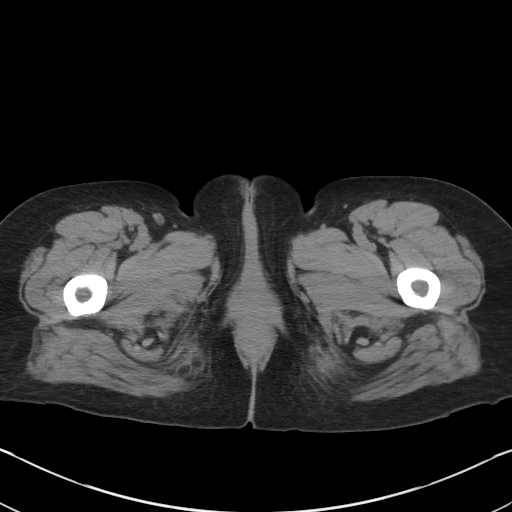
[im 6/98  bone]
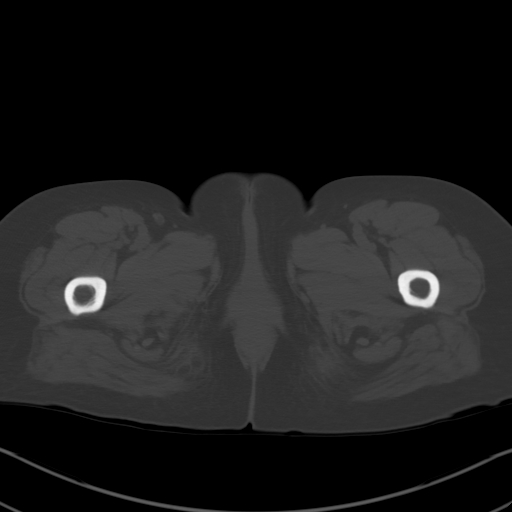
[im 17/98  soft-tissue]
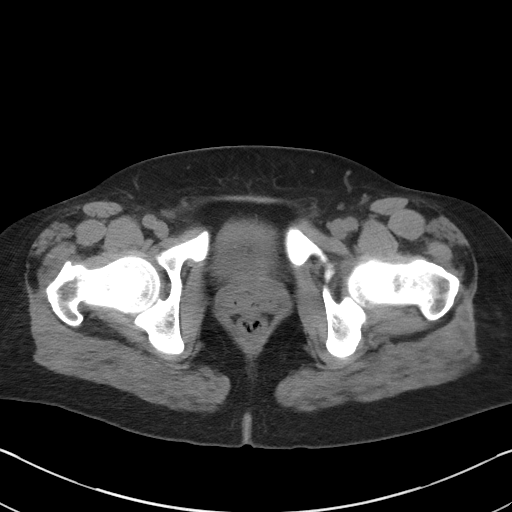
[im 22/98  soft-tissue]
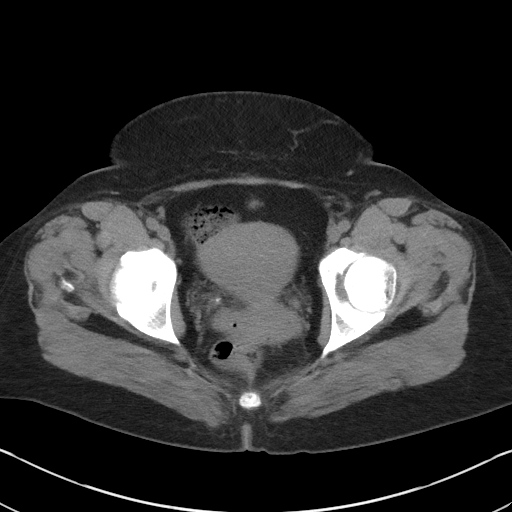
[im 27/98  soft-tissue]
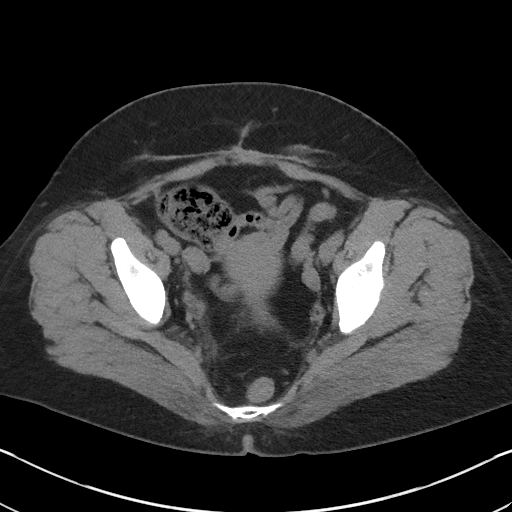
[im 38/98  soft-tissue]
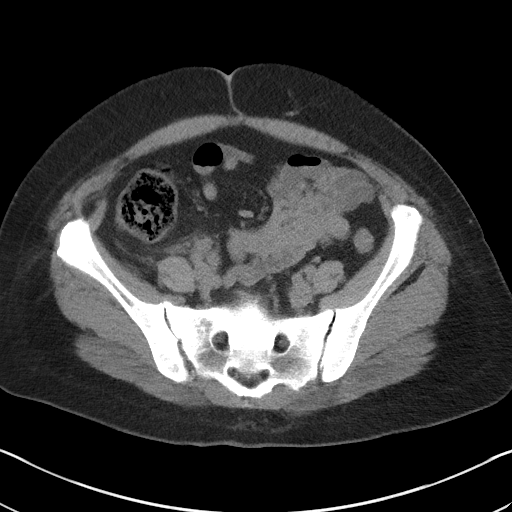
[im 44/98  soft-tissue]
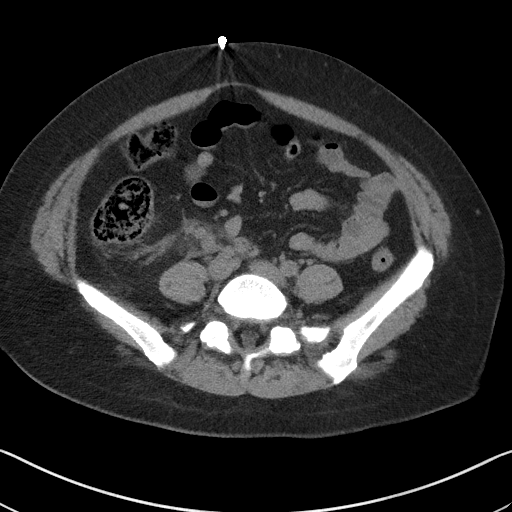
[im 54/98  soft-tissue]
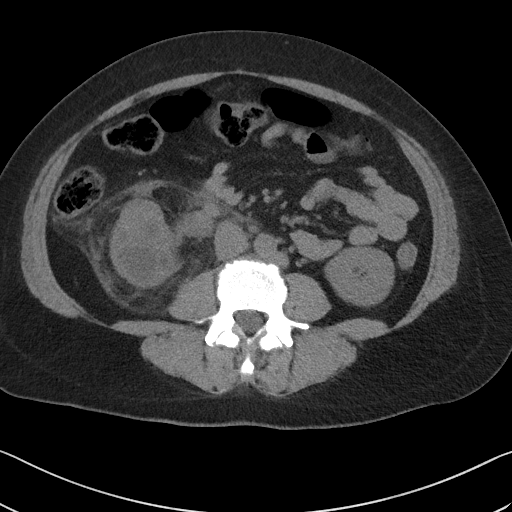
[im 60/98  soft-tissue]
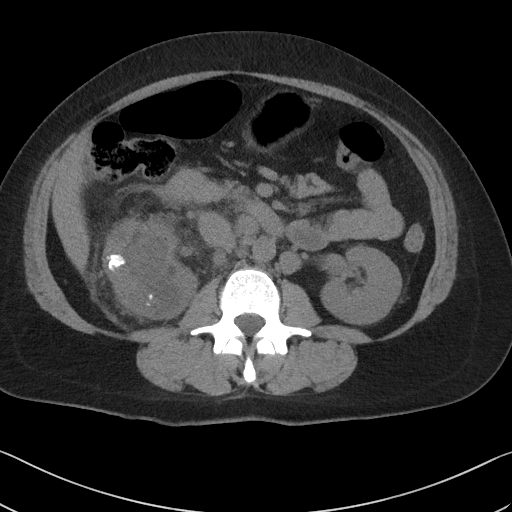
[im 71/98  soft-tissue]
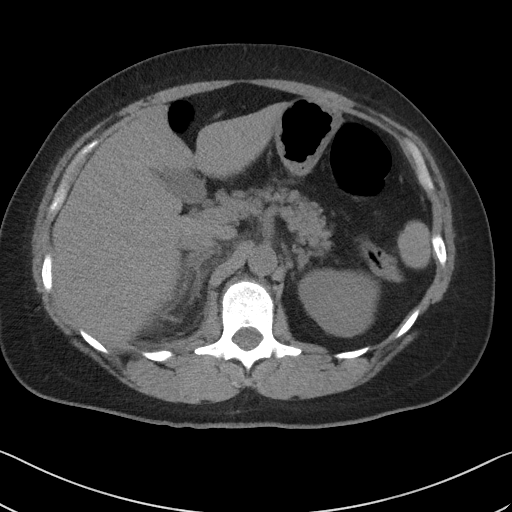
[im 71/98  bone]
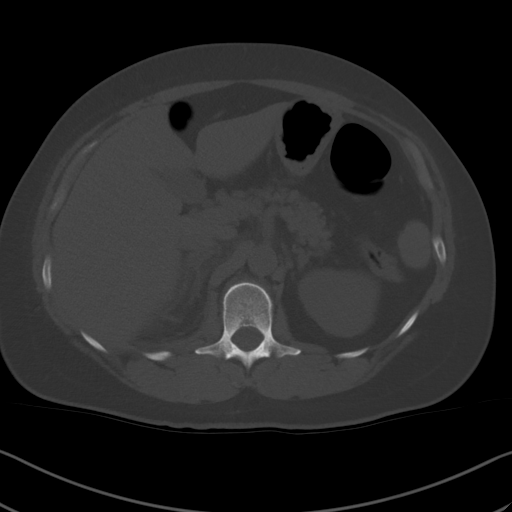
[im 76/98  soft-tissue]
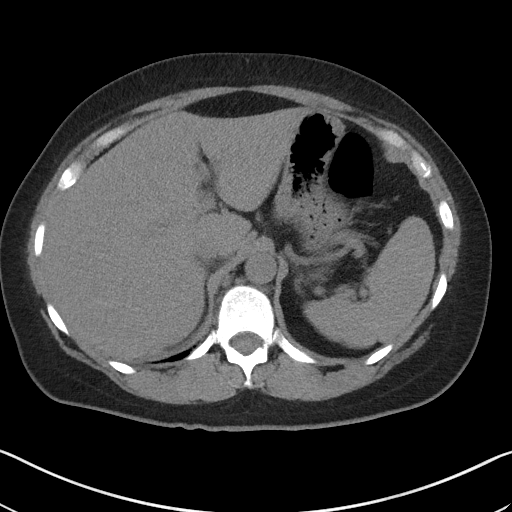
[im 81/98  soft-tissue]
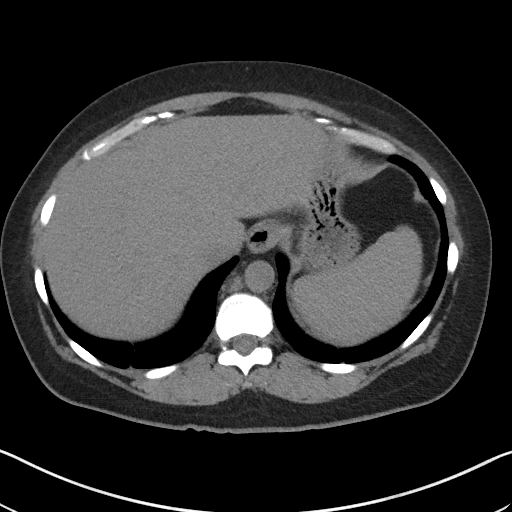
[im 92/98  soft-tissue]
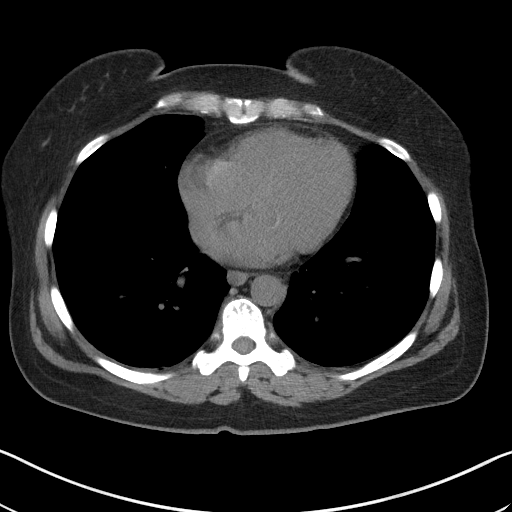

[Series 5: coronal st · coronal · 0.73mm/px · 3 of 106 slices shown]
[im 36/106  soft-tissue]
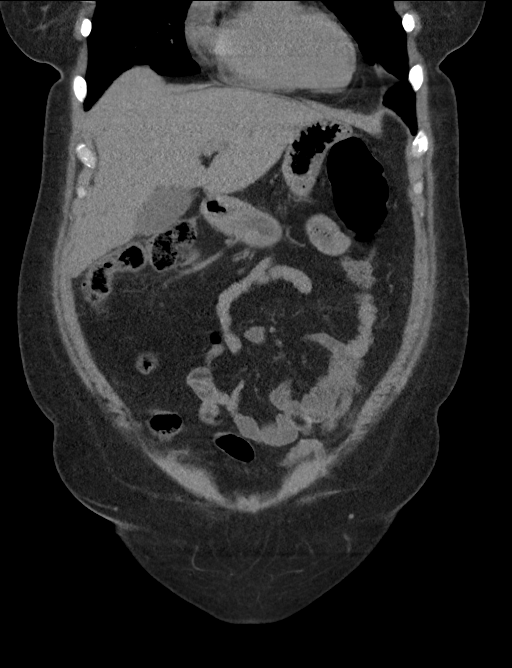
[im 47/106  soft-tissue]
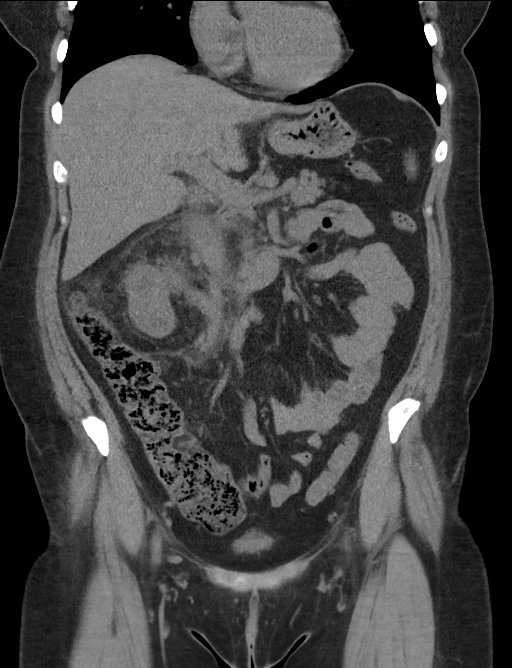
[im 59/106  soft-tissue]
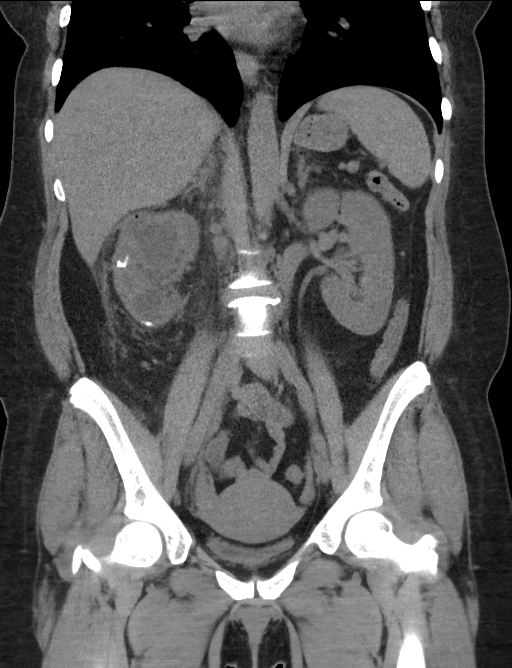

[15 of 46 positions shown; findings below may reference images not displayed]

FINDINGS: Lower chest: No acute abnormality.

Hepatobiliary: No focal liver abnormality is seen. No gallstones,
gallbladder wall thickening, or biliary dilatation.

Pancreas: Unremarkable. No pancreatic ductal dilatation or
surrounding inflammatory changes.

Spleen: Normal in size without focal abnormality.

Adrenals/Urinary Tract: Normal adrenal glands. Normal appearance of
the left kidney, left ureter and urinary bladder. Markedly abnormal
appearance of the right kidney. Numerous large right renal calculi,
including a 12 mm calculus within the right renal pelvis at the UPJ.
Abnormal hypoattenuated appearance of the right kidney likely
represents severe hydronephrosis with thinning of the right renal
cortex. Scattered foci of gas within the right renal
parenchyma/collecting system are noted. Marked perinephric
inflammatory changes. The right ureter is dilated with the proximal
ureter measuring 18 mm. There is an obstructive 5.7 mm calculus in
the distal right ureter, just shy of the vesicoureteral junction.
Periureteral inflammatory changes also present.

Stomach/Bowel: Stomach is within normal limits. No evidence of
appendicitis. No evidence of bowel wall thickening, distention, or
inflammatory changes.

Vascular/Lymphatic: Abnormal enlarged and rounded right
retroperitoneal lymph nodes measure up to 19 mm in short axis.

Reproductive: Uterus and bilateral adnexa are unremarkable.

Other: No abdominal wall hernia or abnormality. No abdominopelvic
ascites.

Musculoskeletal: No acute osseous findings. Bilateral sacroiliitis.
IMPRESSION: 1. Markedly abnormal appearance of the right kidney with severe
hydronephrosis, infectious/inflammatory changes and foci of gas
within the renal parenchyma/collecting system, concerning for
emphysematous pyelonephritis complicating ongoing right renal
obstruction.
2. Numerous large right renal calculi, including a 12 mm probably
obstructive calculus within the right renal pelvis at the UPJ.
mm obstructive calculus in the distal right ureter, just shy of the
vesicoureteral junction.
3. Abnormal enlarged and rounded right retroperitoneal lymph nodes
measure up to 19 mm in short axis, presumably reactive. Follow-up
may be considered after resolution of patient's acute symptomatology
to assure resolution.

These results were called by telephone at the time of interpretation
on 04/20/2019 at [DATE] to provider Ebk Remigio, who verbally
acknowledged these results.

## 2021-11-25 ENCOUNTER — Encounter: Payer: Self-pay | Admitting: Adult Health

## 2021-11-25 ENCOUNTER — Ambulatory Visit (INDEPENDENT_AMBULATORY_CARE_PROVIDER_SITE_OTHER): Payer: Medicaid Other | Admitting: Adult Health

## 2021-11-25 VITALS — BP 134/90 | HR 73 | Ht 63.0 in | Wt 169.5 lb

## 2021-11-25 DIAGNOSIS — G479 Sleep disorder, unspecified: Secondary | ICD-10-CM

## 2021-11-25 DIAGNOSIS — N951 Menopausal and female climacteric states: Secondary | ICD-10-CM

## 2021-11-25 DIAGNOSIS — R4589 Other symptoms and signs involving emotional state: Secondary | ICD-10-CM | POA: Diagnosis not present

## 2021-11-25 DIAGNOSIS — R5383 Other fatigue: Secondary | ICD-10-CM

## 2021-11-25 DIAGNOSIS — N926 Irregular menstruation, unspecified: Secondary | ICD-10-CM

## 2021-11-25 DIAGNOSIS — F32A Depression, unspecified: Secondary | ICD-10-CM

## 2021-11-25 DIAGNOSIS — F419 Anxiety disorder, unspecified: Secondary | ICD-10-CM

## 2021-11-25 DIAGNOSIS — R232 Flushing: Secondary | ICD-10-CM

## 2021-11-25 DIAGNOSIS — R52 Pain, unspecified: Secondary | ICD-10-CM

## 2021-11-25 MED ORDER — VENLAFAXINE HCL ER 37.5 MG PO CP24: 37.5000 mg | ORAL_CAPSULE | Freq: Every day | ORAL | 1 refills | Status: DC

## 2021-11-25 NOTE — Progress Notes (Signed)
Subjective:     Patient ID: Pamela Horn, female   DOB: 27-Feb-1981, 41 y.o.   MRN: [id number]  HPI Pamela Horn is a 41 year old white female, single, [id number], in with numerous complaints, all perimenopause symtpoms.  Lab Results  Component Value Date   DIAGPAP  10/28/2020    - Negative for intraepithelial lesion or malignancy (NILM)   HPV NOT DETECTED 04/24/2018   HPVHIGH Negative 10/28/2020   PCP is Dr Sudie Bailey  Review of Systems +Moody +Hot flashes +irregular periods,has skipped 2  +trouble sleeping +body aches +no energy Reviewed past medical,surgical, social and family history. Reviewed medications and allergies.     Objective:   Physical Exam BP 134/90 (BP Location: Left Arm, Patient Position: Sitting, Cuff Size: Normal)   Pulse 73   Ht 5\' 3"  (1.6 m)   Wt 169 lb 8 oz (76.9 kg)   LMP 11/24/2021   BMI 30.03 kg/m     Skin warm and dry. Neck: mid line trachea, normal thyroid, good ROM, no lymphadenopathy noted. Lungs: clear to ausculation bilaterally. Cardiovascular: regular rate and rhythm.  Fall risk is low    11/25/2021   11:40 AM 10/28/2020    3:35 PM 04/24/2018    2:18 PM  Depression screen PHQ 2/9  Decreased Interest 3 0 0  Down, Depressed, Hopeless 0 0 0  PHQ - 2 Score 3 0 0  Altered sleeping 3 0   Tired, decreased energy 3 0   Change in appetite 2 0   Feeling bad or failure about yourself  1 0   Trouble concentrating 1 0   Moving slowly or fidgety/restless 2 0   Suicidal thoughts 0 0   PHQ-9 Score 15 0        11/25/2021   11:44 AM 10/28/2020    3:35 PM  GAD 7 : Generalized Anxiety Score  Nervous, Anxious, on Edge 2 0  Control/stop worrying 2 0  Worry too much - different things 2 0  Trouble relaxing 2 0  Restless 3 0  Easily annoyed or irritable 3 0  Afraid - awful might happen 0 0  Total GAD 7 Score 14 0      Upstream - 11/25/21 1140       Pregnancy Intention Screening   Does the patient want to become pregnant in the next year? No    Does  the patient's partner want to become pregnant in the next year? No    Would the patient like to discuss contraceptive options today? No      Contraception Wrap Up   Current Method Female Sterilization    End Method Female Sterilization             Assessment:      1. Moody - Follicle stimulating hormone  2. Irregular periods - TSH - Follicle stimulating hormone  3. Hot flashes - Follicle stimulating hormone  4. Trouble in sleeping - Follicle stimulating hormone  5. No energy - CBC - TSH - Follicle stimulating hormone - Comprehensive metabolic panel  6. Generalized body aches - TSH - Follicle stimulating hormone - Comprehensive metabolic panel  7. Anxiety and depression Will try Effexor 37.5 mg for anxiety and depression, moods and other symptoms of peri menopause  Meds ordered this encounter  Medications   venlafaxine XR (EFFEXOR XR) 37.5 MG 24 hr capsule    Sig: Take 1 capsule (37.5 mg total) by mouth daily with breakfast.    Dispense:  30 capsule  Refill:  1    Order Specific Question:   Supervising Provider    Answer:   Despina Hidden, LUTHER H [[id number]]     8. Perimenopause Will try Effexor, she is a smoker Review handout on peri menopause    Plan:     Follow up with me in 4 weeks for ROS

## 2021-11-26 LAB — TSH: TSH: 0.648 u[IU]/mL (ref 0.450–4.500)

## 2021-11-26 LAB — COMPREHENSIVE METABOLIC PANEL
ALT: 7 IU/L (ref 0–32)
AST: 10 IU/L (ref 0–40)
Albumin/Globulin Ratio: 1.7 (ref 1.2–2.2)
Albumin: 4.7 g/dL (ref 3.8–4.8)
Alkaline Phosphatase: 63 IU/L (ref 44–121)
BUN/Creatinine Ratio: 12 (ref 9–23)
BUN: 12 mg/dL (ref 6–24)
Bilirubin Total: 0.4 mg/dL (ref 0.0–1.2)
CO2: 25 mmol/L (ref 20–29)
Calcium: 9.2 mg/dL (ref 8.7–10.2)
Chloride: 104 mmol/L (ref 96–106)
Creatinine, Ser: 0.98 mg/dL (ref 0.57–1.00)
Globulin, Total: 2.7 g/dL (ref 1.5–4.5)
Glucose: 69 mg/dL — ABNORMAL LOW (ref 70–99)
Potassium: 4.1 mmol/L (ref 3.5–5.2)
Sodium: 143 mmol/L (ref 134–144)
Total Protein: 7.4 g/dL (ref 6.0–8.5)
eGFR: 73 mL/min/{1.73_m2} (ref >59)

## 2021-11-26 LAB — CBC
Hematocrit: 44.9 % (ref 34.0–46.6)
Hemoglobin: 15.2 g/dL (ref 11.1–15.9)
MCH: 27.9 pg (ref 26.6–33.0)
MCHC: 33.9 g/dL (ref 31.5–35.7)
MCV: 82 fL (ref 79–97)
Platelets: 283 10*3/uL (ref 150–450)
RBC: 5.45 x10E6/uL — ABNORMAL HIGH (ref 3.77–5.28)
RDW: 12.8 % (ref 11.7–15.4)
WBC: 7.1 10*3/uL (ref 3.4–10.8)

## 2021-11-26 LAB — FOLLICLE STIMULATING HORMONE: FSH: 10 m[IU]/mL

## 2021-12-23 ENCOUNTER — Ambulatory Visit: Payer: Medicaid Other | Admitting: Adult Health

## 2023-06-13 ENCOUNTER — Ambulatory Visit (INDEPENDENT_AMBULATORY_CARE_PROVIDER_SITE_OTHER): Payer: MEDICAID

## 2023-06-13 VITALS — BP 141/99 | HR 83 | Ht 62.0 in | Wt 176.2 lb

## 2023-06-13 DIAGNOSIS — Z3202 Encounter for pregnancy test, result negative: Secondary | ICD-10-CM | POA: Diagnosis not present

## 2023-06-13 DIAGNOSIS — N926 Irregular menstruation, unspecified: Secondary | ICD-10-CM | POA: Diagnosis not present

## 2023-06-13 DIAGNOSIS — Z3200 Encounter for pregnancy test, result unknown: Secondary | ICD-10-CM

## 2023-06-13 LAB — POCT URINE PREGNANCY: Preg Test, Ur: NEGATIVE

## 2023-06-13 NOTE — Progress Notes (Signed)
   NURSE VISIT- PREGNANCY CONFIRMATION   SUBJECTIVE:  Pamela Horn is a 42 y.o. [phone] female at by certain LMP of Patient's last menstrual period was 03/15/2023 (exact date). Patient states she had no period in a year but also states she has had 4 periods in the past year. Patient states she had a tubal ligation 8 years ago. Here for pregnancy confirmation.  Home pregnancy test: faint positive x 2 . She reports nausea, cramping, feeling faint. She is not taking prenatal vitamins.  OBJECTIVE:  BP (!) 141/99 (BP Location: Right Arm, Patient Position: Sitting, Cuff Size: Normal)   Pulse 83   Ht 5\' 2"  (1.575 m)   Wt 176 lb 3.2 oz (79.9 kg)   LMP 03/15/2023 (Exact Date) Comment: Only 4 periods in the past year.  BMI 32.23 kg/m   Appears well, in no apparent distress  Results for orders placed or performed in visit on 06/13/23 (from the past 24 hour(s))  POCT urine pregnancy   Collection Time: 06/13/23  4:00 PM  Result Value Ref Range   Preg Test, Ur Negative Negative    ASSESSMENT: Negative pregnancy test    PLAN: Quantitative HCG ordered  OB packet given: No  Pamela Horn  06/13/2023 4:11 PM

## 2023-06-14 LAB — BETA HCG QUANT (REF LAB): hCG Quant: <1 m[IU]/mL

## 2023-07-18 ENCOUNTER — Emergency Department (HOSPITAL_COMMUNITY): Payer: MEDICAID

## 2023-07-18 ENCOUNTER — Emergency Department (HOSPITAL_COMMUNITY)
Admission: EM | Admit: 2023-07-18 | Discharge: 2023-07-18 | Disposition: A | Discharge status: Home / Self Care | Payer: MEDICAID | Attending: Emergency Medicine | Admitting: Emergency Medicine

## 2023-07-18 ENCOUNTER — Encounter (HOSPITAL_COMMUNITY): Payer: Self-pay

## 2023-07-18 DIAGNOSIS — R300 Dysuria: Secondary | ICD-10-CM | POA: Diagnosis present

## 2023-07-18 DIAGNOSIS — R1084 Generalized abdominal pain: Secondary | ICD-10-CM

## 2023-07-18 DIAGNOSIS — K439 Ventral hernia without obstruction or gangrene: Secondary | ICD-10-CM | POA: Diagnosis not present

## 2023-07-18 LAB — URINALYSIS, ROUTINE W REFLEX MICROSCOPIC
Bilirubin Urine: NEGATIVE
Glucose, UA: NEGATIVE mg/dL
Hgb urine dipstick: NEGATIVE
Ketones, ur: NEGATIVE mg/dL
Leukocytes,Ua: NEGATIVE
Nitrite: NEGATIVE
Protein, ur: NEGATIVE mg/dL
Specific Gravity, Urine: 1.012 (ref 1.005–1.030)
pH: 6 (ref 5.0–8.0)

## 2023-07-18 LAB — WET PREP, GENITAL
Sperm: NONE SEEN
Trich, Wet Prep: NONE SEEN
WBC, Wet Prep HPF POC: <10 (ref <10)
Yeast Wet Prep HPF POC: NONE SEEN

## 2023-07-18 LAB — COMPREHENSIVE METABOLIC PANEL
ALT: 12 U/L (ref 0–44)
AST: 12 U/L — ABNORMAL LOW (ref 15–41)
Albumin: 3.7 g/dL (ref 3.5–5.0)
Alkaline Phosphatase: 49 U/L (ref 38–126)
Anion gap: 6 (ref 5–15)
BUN: 10 mg/dL (ref 6–20)
CO2: 26 mmol/L (ref 22–32)
Calcium: 9.2 mg/dL (ref 8.9–10.3)
Chloride: 105 mmol/L (ref 98–111)
Creatinine, Ser: 0.96 mg/dL (ref 0.44–1.00)
GFR, Estimated: >60 mL/min (ref >60)
Glucose, Bld: 125 mg/dL — ABNORMAL HIGH (ref 70–99)
Potassium: 4.3 mmol/L (ref 3.5–5.1)
Sodium: 137 mmol/L (ref 135–145)
Total Bilirubin: 0.4 mg/dL (ref 0.0–1.2)
Total Protein: 6.9 g/dL (ref 6.5–8.1)

## 2023-07-18 LAB — CBC WITH DIFFERENTIAL/PLATELET
Abs Immature Granulocytes: 0.03 10*3/uL (ref 0.00–0.07)
Basophils Absolute: 0 10*3/uL (ref 0.0–0.1)
Basophils Relative: 0 %
Eosinophils Absolute: 0.1 10*3/uL (ref 0.0–0.5)
Eosinophils Relative: 1 %
HCT: 43.2 % (ref 36.0–46.0)
Hemoglobin: 14.3 g/dL (ref 12.0–15.0)
Immature Granulocytes: 0 %
Lymphocytes Relative: 21 %
Lymphs Abs: 1.5 10*3/uL (ref 0.7–4.0)
MCH: 28.7 pg (ref 26.0–34.0)
MCHC: 33.1 g/dL (ref 30.0–36.0)
MCV: 86.7 fL (ref 80.0–100.0)
Monocytes Absolute: 0.4 10*3/uL (ref 0.1–1.0)
Monocytes Relative: 6 %
Neutro Abs: 5.1 10*3/uL (ref 1.7–7.7)
Neutrophils Relative %: 72 %
Platelets: 266 10*3/uL (ref 150–400)
RBC: 4.98 MIL/uL (ref 3.87–5.11)
RDW: 12.8 % (ref 11.5–15.5)
WBC: 7.1 10*3/uL (ref 4.0–10.5)
nRBC: 0 % (ref 0.0–0.2)

## 2023-07-18 LAB — LIPASE, BLOOD: Lipase: 52 U/L — ABNORMAL HIGH (ref 11–51)

## 2023-07-18 LAB — POC URINE PREG, ED: Preg Test, Ur: NEGATIVE

## 2023-07-18 LAB — HCG, QUANTITATIVE, PREGNANCY: hCG, Beta Chain, Quant, S: 1 m[IU]/mL (ref <5)

## 2023-07-18 MED ORDER — IOHEXOL 300 MG/ML  SOLN
100.0000 mL | Freq: Once | INTRAMUSCULAR | Status: AC | PRN
  Administered 2023-07-18: 100 mL via INTRAVENOUS

## 2023-07-18 MED ORDER — LIDOCAINE HCL (PF) 1 % IJ SOLN
INTRAMUSCULAR | Status: AC
  Administered 2023-07-18: 5 mL
  Filled 2023-07-18: qty 5

## 2023-07-18 MED ORDER — CEFTRIAXONE SODIUM 500 MG IJ SOLR
500.0000 mg | Freq: Once | INTRAMUSCULAR | Status: AC
  Administered 2023-07-18: 500 mg via INTRAMUSCULAR
  Filled 2023-07-18: qty 500

## 2023-07-18 MED ORDER — DOXYCYCLINE HYCLATE 100 MG PO CAPS: 100.0000 mg | ORAL_CAPSULE | Freq: Two times a day (BID) | ORAL | 0 refills | Status: AC

## 2023-07-18 NOTE — Discharge Instructions (Signed)
 Your workup this evening was reassuring.  Your pregnancy test was negative and your CT of your abdomen and pelvis did not show any acute findings.  You have been treated with antibiotics for possible STD.  Your test results are still pending.  You will be notified of any positive results or you may review the results on MyChart.  These call your primary care provider for recheck or your OB/GYN if needed.

## 2023-07-18 NOTE — ED Provider Notes (Signed)
 [place] EMERGENCY DEPARTMENT AT [facility] Provider Note   CSN: [id number] Arrival date & time: 07/18/23  1621     History  Chief Complaint  Patient presents with   Dysuria   Back Pain    Pamela Horn is a 43 y.o. female.   Dysuria Associated symptoms: abdominal pain   Associated symptoms: no fever, no flank pain, no nausea and no vomiting   Back Pain Associated symptoms: abdominal pain and dysuria   Associated symptoms: no chest pain and no fever        Pamela Horn is a 43 y.o. female who presents to the Emergency Department complaining of dysuria abdominal pain and swelling.  Symptoms present for 2 days.  States that she feels movement in her abdomen.  She also feels that she has a UTI and states I only get UTIs when I am pregnant and I had my tubes tied 8 years ago.  States that she had a urine pregnancy test in early December that was negative but she is here requesting to be retested for possible pregnancy.  She states that she feels her abdomen is swollen and she feels movement and also that she is having swelling and tenderness of her breasts.  Had unprotected intercourse late November.  Denies vaginal bleeding at this time.    Home Medications Prior to Admission medications   Medication Sig Start Date End Date Taking? Authorizing Provider  baclofen (LIORESAL) 10 MG tablet Take 10 mg by mouth 2 (two) times daily as needed. 02/13/23  Yes [provider]  buPROPion (WELLBUTRIN XL) 150 MG 24 hr tablet Take 150 mg by mouth daily. 06/22/23  Yes [provider]  doxycycline  (VIBRAMYCIN ) 100 MG capsule Take 1 capsule (100 mg total) by mouth 2 (two) times daily. 07/18/23  Yes Seve Monette, PA-C  gabapentin  (NEURONTIN ) 100 MG capsule Take 100 mg by mouth at bedtime. 05/03/23  Yes [provider]  QUEtiapine (SEROQUEL) 25 MG tablet Take 50 mg by mouth at bedtime. 06/22/23  Yes [provider]  SUBOXONE  8-2 MG FILM Place  0.5 Film under the tongue 4 (four) times daily. 07/14/23   [provider]  venlafaxine  XR (EFFEXOR  XR) 37.5 MG 24 hr capsule Take 1 capsule (37.5 mg total) by mouth daily with breakfast. Patient not taking: Reported on 06/13/2023 11/25/21   Signa Delon LABOR, NP      Allergies    Bactrim  [sulfamethoxazole -trimethoprim ]    Review of Systems   Review of Systems  Constitutional:  Negative for fever.  Cardiovascular:  Negative for chest pain.  Gastrointestinal:  Positive for abdominal pain. Negative for constipation, nausea and vomiting.  Genitourinary:  Positive for dysuria and menstrual problem. Negative for flank pain and vaginal bleeding.  Musculoskeletal:  Negative for back pain.    Physical Exam Updated Vital Signs BP (!) 149/103   Pulse 91   Temp 99.1 F (37.3 C) (Oral)   Resp 17   Ht 5' 2 (1.575 m)   Wt 79.8 kg   SpO2 100%   BMI 32.19 kg/m  Physical Exam Vitals and nursing note reviewed.  Constitutional:      General: She is not in acute distress.    Appearance: Normal appearance. She is not ill-appearing or toxic-appearing.  Cardiovascular:     Rate and Rhythm: Normal rate and regular rhythm.     Pulses: Normal pulses.  Pulmonary:     Effort: Pulmonary effort is normal.  Abdominal:  General: There is no distension.     Palpations: Abdomen is soft.     Tenderness: There is no abdominal tenderness. There is no right CVA tenderness or left CVA tenderness.     Comments: Ventral hernia, soft spontaneously reduces when supine  Musculoskeletal:        General: Normal range of motion.  Skin:    General: Skin is warm.     Capillary Refill: Capillary refill takes less than 2 seconds.  Neurological:     General: No focal deficit present.     Mental Status: She is alert.     Sensory: No sensory deficit.     Motor: No weakness.     ED Results / Procedures / Treatments   Labs (all labs ordered are listed, but only abnormal results are displayed) Labs  Reviewed  WET PREP, GENITAL - Abnormal; Notable for the following components:      Result Value   Clue Cells Wet Prep HPF POC PRESENT (*)    All other components within normal limits  URINALYSIS, ROUTINE W REFLEX MICROSCOPIC - Abnormal; Notable for the following components:   APPearance HAZY (*)    All other components within normal limits  COMPREHENSIVE METABOLIC PANEL - Abnormal; Notable for the following components:   Glucose, Bld 125 (*)    AST 12 (*)    All other components within normal limits  LIPASE, BLOOD - Abnormal; Notable for the following components:   Lipase 52 (*)    All other components within normal limits  CBC WITH DIFFERENTIAL/PLATELET  HCG, QUANTITATIVE, PREGNANCY  POC URINE PREG, ED  GC/CHLAMYDIA PROBE AMP ([facility]) NOT AT [facility]    EKG None  Radiology CT ABDOMEN PELVIS W CONTRAST Result Date: 07/18/2023 CLINICAL DATA:  Acute abdominal pain EXAM: CT ABDOMEN AND PELVIS WITH CONTRAST TECHNIQUE: Multidetector CT imaging of the abdomen and pelvis was performed using the standard protocol following bolus administration of intravenous contrast. RADIATION DOSE REDUCTION: This exam was performed according to the departmental dose-optimization program which includes automated exposure control, adjustment of the mA and/or kV according to patient size and/or use of iterative reconstruction technique. CONTRAST:  OMNIPAQUE  IOHEXOL  300 MG/ML  SOLN COMPARISON:  CT abdomen and pelvis 05/14/2021. FINDINGS: Lower chest: No acute abnormality. Hepatobiliary: No focal liver abnormality is seen. No gallstones, gallbladder wall thickening, or biliary dilatation. Pancreas: Unremarkable. No pancreatic ductal dilatation or surrounding inflammatory changes. Spleen: Normal in size without focal abnormality. 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 The bilateral adrenal glands, left kidney and bladder are within normal limits. Stomach/Bowel: Stomach is within normal  limits. Appendix appears normal. No evidence of bowel wall thickening, distention, or inflammatory changes. Vascular/Lymphatic: No significant vascular findings are present. No enlarged abdominal or pelvic lymph nodes. Reproductive: Uterus and bilateral adnexa are unremarkable. Other: There is a small supraumbilical fat containing ventral hernia right of midline. There is no ascites. Musculoskeletal: Stable bilateral sacroiliitis. IMPRESSION: 1. No acute localizing process in the abdomen or pelvis. 2. Right nephrectomy changes. 3. Small supraumbilical fat containing ventral hernia. Electronically Signed   By: Greig Pique M.D.   On: 07/18/2023 19:09    Procedures Procedures    Medications Ordered in ED Medications  iohexol  (OMNIPAQUE ) 300 MG/ML solution 100 mL (100 mLs Intravenous Contrast Given 07/18/23 1843)  cefTRIAXone  (ROCEPHIN ) injection 500 mg (500 mg Intramuscular Given 07/18/23 2149)  lidocaine  (PF) (XYLOCAINE ) 1 % injection (5 mLs  Given 07/18/23 2151)    ED Course/ Medical Decision  Making/ A&P                                 Medical Decision Making Patient here for evaluation of abdominal pain and concern for pregnancy.  States that she feels movement to her abdomen she is also concerned that she has a UTI stating that she only gets UTIs when she is pregnant.  Describes burning with urination.  No fever chills, vomiting or flank pain.  Endorses unprotected intercourse in November had negative pregnancy test but is here requesting repeat testing  Clinically, patient appears anxious but well-appearing.  I do not appreciate any significant tenderness or abdominal distention on exam.  She does have a ventral hernia that spontaneously reduces when supine.  Differential would include but not limited to pregnancy, presumptive movement from intestinal gas, reported dysuria symptoms possibly related to UTI, pyelonephritis, kidney stone, STI.  Ectopic pregnancy, torsion and TOA also considered but  felt less likely  Amount and/or Complexity of Data Reviewed Labs: ordered.    Details: Labs interpreted by me, overall reassuring.  No leukocytosis, chemistries without derangement, hCG is 1 urine pregnancy test negative.  Wet prep shows presence of clue cells otherwise unremarkable urinalysis without evidence of infection  GC chlamydia cultures pending Radiology: ordered.    Details: CT abdomen and pelvis shows no acute localizing process of the abdomen or pelvis.  Small supraumbilical fat-containing ventral hernia Discussion of management or test interpretation with external provider(s): Workup this evening reassuring.  Pregnancy test negative.  No acute process on CT of the abdomen and pelvis.  Patient reassured.  Will presumptively treat for STI, given Rocephin  here prescription for doxycycline .  Patient appears appropriate for discharge home, all questions were answered.  She will follow-up outpatient with PCP and OB/GYN if needed  Risk Prescription drug management.           Final Clinical Impression(s) / ED Diagnoses Final diagnoses:  Generalized abdominal pain  Dysuria    Rx / DC Orders ED Discharge Orders          Ordered    doxycycline  (VIBRAMYCIN ) 100 MG capsule  2 times daily        07/18/23 2222              Herlinda Milling, PA-C 07/18/23 2300    Yolande Lamar BROCKS, MD 07/25/23 1156

## 2023-07-18 NOTE — ED Triage Notes (Addendum)
 Pt c/o low back pain radiating into hips, dysuria, and abdominal swelling starting last night, and swelling in RUE and RLE x2 days.  Pain score 5/10.  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  Pt reports I only get UTIs when I'm pregnant and I had my tubes tied 8 years ago.  Pt reports feeling movement in abdomen and sts leaking from vagina x2 yesterday.      Pt is adamant that a POC urine pregnancy test will be negative and sts she needs a blood test.  Pt had negative blood and urine tests on 12/3.

## 2023-07-20 LAB — GC/CHLAMYDIA PROBE AMP (~~LOC~~) NOT AT ARMC
Chlamydia: NEGATIVE
Comment: NEGATIVE
Comment: NORMAL
Neisseria Gonorrhea: NEGATIVE

## 2023-11-25 ENCOUNTER — Other Ambulatory Visit: Payer: Self-pay

## 2023-11-25 ENCOUNTER — Encounter (HOSPITAL_COMMUNITY): Payer: Self-pay | Admitting: Emergency Medicine

## 2023-11-25 ENCOUNTER — Emergency Department (HOSPITAL_COMMUNITY)
Admission: EM | Admit: 2023-11-25 | Discharge: 2023-11-27 | Disposition: A | Discharge status: Other Acute Inpatient Hospital | Payer: Self-pay | Attending: Emergency Medicine | Admitting: Emergency Medicine

## 2023-11-25 DIAGNOSIS — F1210 Cannabis abuse, uncomplicated: Secondary | ICD-10-CM

## 2023-11-25 DIAGNOSIS — F1121 Opioid dependence, in remission: Secondary | ICD-10-CM | POA: Insufficient documentation

## 2023-11-25 DIAGNOSIS — F22 Delusional disorders: Secondary | ICD-10-CM | POA: Insufficient documentation

## 2023-11-25 DIAGNOSIS — F29 Unspecified psychosis not due to a substance or known physiological condition: Secondary | ICD-10-CM

## 2023-11-25 LAB — CBC WITH DIFFERENTIAL/PLATELET
Abs Immature Granulocytes: 0.02 10*3/uL (ref 0.00–0.07)
Basophils Absolute: 0 10*3/uL (ref 0.0–0.1)
Basophils Relative: 0 %
Eosinophils Absolute: 0 10*3/uL (ref 0.0–0.5)
Eosinophils Relative: 0 %
HCT: 46.5 % — ABNORMAL HIGH (ref 36.0–46.0)
Hemoglobin: 15.4 g/dL — ABNORMAL HIGH (ref 12.0–15.0)
Immature Granulocytes: 0 %
Lymphocytes Relative: 16 %
Lymphs Abs: 1.3 10*3/uL (ref 0.7–4.0)
MCH: 28.6 pg (ref 26.0–34.0)
MCHC: 33.1 g/dL (ref 30.0–36.0)
MCV: 86.3 fL (ref 80.0–100.0)
Monocytes Absolute: 0.4 10*3/uL (ref 0.1–1.0)
Monocytes Relative: 5 %
Neutro Abs: 6.2 10*3/uL (ref 1.7–7.7)
Neutrophils Relative %: 79 %
Platelets: 283 10*3/uL (ref 150–400)
RBC: 5.39 MIL/uL — ABNORMAL HIGH (ref 3.87–5.11)
RDW: 13.2 % (ref 11.5–15.5)
WBC: 7.9 10*3/uL (ref 4.0–10.5)
nRBC: 0 % (ref 0.0–0.2)

## 2023-11-25 LAB — COMPREHENSIVE METABOLIC PANEL WITH GFR
ALT: 9 U/L (ref 0–44)
AST: 11 U/L — ABNORMAL LOW (ref 15–41)
Albumin: 4.2 g/dL (ref 3.5–5.0)
Alkaline Phosphatase: 58 U/L (ref 38–126)
Anion gap: 6 (ref 5–15)
BUN: 6 mg/dL (ref 6–20)
CO2: 25 mmol/L (ref 22–32)
Calcium: 8.9 mg/dL (ref 8.9–10.3)
Chloride: 103 mmol/L (ref 98–111)
Creatinine, Ser: 1.06 mg/dL — ABNORMAL HIGH (ref 0.44–1.00)
GFR, Estimated: >60 mL/min (ref >60)
Glucose, Bld: 114 mg/dL — ABNORMAL HIGH (ref 70–99)
Potassium: 4 mmol/L (ref 3.5–5.1)
Sodium: 134 mmol/L — ABNORMAL LOW (ref 135–145)
Total Bilirubin: 0.8 mg/dL (ref 0.0–1.2)
Total Protein: 7.3 g/dL (ref 6.5–8.1)

## 2023-11-25 LAB — RAPID URINE DRUG SCREEN, HOSP PERFORMED
Amphetamines: NOT DETECTED
Barbiturates: NOT DETECTED
Benzodiazepines: NOT DETECTED
Cocaine: NOT DETECTED
Opiates: NOT DETECTED
Tetrahydrocannabinol: POSITIVE — AB

## 2023-11-25 LAB — ETHANOL: Alcohol, Ethyl (B): <15 mg/dL (ref <15)

## 2023-11-25 LAB — PREGNANCY, URINE: Preg Test, Ur: NEGATIVE

## 2023-11-25 MED ORDER — ACETAMINOPHEN 325 MG PO TABS
650.0000 mg | ORAL_TABLET | Freq: Once | ORAL | Status: AC
  Administered 2023-11-25: 650 mg via ORAL
  Filled 2023-11-25: qty 2

## 2023-11-25 MED ORDER — DROPERIDOL 2.5 MG/ML IJ SOLN: 5.0000 mg | Freq: Three times a day (TID) | INTRAMUSCULAR | Status: DC | PRN

## 2023-11-25 MED ORDER — LORAZEPAM 2 MG/ML IJ SOLN: 1.0000 mg | Freq: Three times a day (TID) | INTRAMUSCULAR | Status: DC | PRN

## 2023-11-25 MED ORDER — OLANZAPINE 5 MG PO TBDP
5.0000 mg | ORAL_TABLET | Freq: Every day | ORAL | Status: DC
Start: 2023-11-26 — End: 2023-11-27
  Filled 2023-11-25 (×2): qty 1
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
## 2023-11-25 NOTE — ED Notes (Signed)
 Pt stated, "I need some tylenol . I have this pressure behind my eyes and it gets worse when people walk by. Its the frequency of the people." EDP notified
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
## 2023-11-25 NOTE — BH Assessment (Signed)
 Per IRIS coordinator, pt will be seen by Forcey,NP at 23:45 .
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
## 2023-11-25 NOTE — Consult Note (Incomplete)
 [facility] Consult Note  Patient Name: Pamela Horn MRN: [id number] DOB: 1980-12-16 DATE OF Consult: 11/25/2023  PRIMARY PSYCHIATRIC DIAGNOSES  1.  *** 2.  *** 3.  ***  RECOMMENDATIONS  {Recommendations:304550007::"Medication recommendations: ***","Non-Medication/therapeutic recommendations: ***","Communication: Treatment team members (and family members if applicable) who were involved in treatment/care discussions and planning, and with whom we spoke or engaged with via secure text/chat, include the following: ***"}  Thank you for involving us  in the care of this patient. If you have any additional questions or concerns, please call [phone] and ask for me or the provider on-call.  TELEPSYCHIATRY ATTESTATION & CONSENT  As the provider for this telehealth consult, I attest that I verified the patient's identity using two separate identifiers, introduced myself to the patient, provided my credentials, disclosed my location, and performed this encounter via a HIPAA-compliant, real-time, face-to-face, two-way, interactive audio and video platform and with the full consent and agreement of the patient (or guardian as applicable.)  Patient physical location: [facility]. Telehealth provider physical location: home office in state of [place].  Video start time: 2230 (Central Time) Video end time: 2248 (Central Time)  IDENTIFYING DATA  Pamela Horn is a 43 y.o. year-old female for whom a psychiatric consultation has been ordered by the primary provider. The patient was identified using two separate identifiers.  CHIEF COMPLAINT/REASON FOR CONSULT  - "I'm very frustrated and I'm mad because I've been sitting still for eight hours in one spot and cannot move against my will. I'm very frustrated." - Pt. presented following involuntary commitment by her mother, expressing frustration with hospitalization and disputing concerns about substance use and child care raised by her mother.   HISTORY OF PRESENT ILLNESS (HPI)  The patient ***.  PAST PSYCHIATRIC HISTORY  *** Otherwise as per HPI above.  PAST MEDICAL HISTORY  Past Medical History:  Diagnosis Date  . History of kidney stones   . Pre-eclampsia    1st pregnancy  . Pregnancy induced hypertension    ***  HOME MEDICATIONS  Facility Ordered Medications  Medication  . [COMPLETED] acetaminophen  (TYLENOL ) tablet 650 mg   PTA Medications  Medication Sig  . venlafaxine  XR (EFFEXOR  XR) 37.5 MG 24 hr capsule Take 1 capsule (37.5 mg total) by mouth daily with breakfast. (Patient not taking: Reported on 06/13/2023)  . gabapentin  (NEURONTIN ) 100 MG capsule Take 100 mg by mouth at bedtime.  Pamela Horn (WELLBUTRIN XL) 150 MG 24 hr tablet Take 150 mg by mouth daily.  . SUBOXONE  8-2 MG FILM Place 0.5 Film under the tongue 4 (four) times daily.  . baclofen (LIORESAL) 10 MG tablet Take 10 mg by mouth 2 (two) times daily as needed.  . doxycycline  (VIBRAMYCIN ) 100 MG capsule Take 1 capsule (100 mg total) by mouth 2 (two) times daily.  . QUEtiapine (SEROQUEL) 50 MG tablet Take 50-100 mg by mouth at bedtime.  . ARIPiprazole (ABILIFY) 5 MG tablet Take 5 mg by mouth daily.   ***  ALLERGIES  Allergies  Allergen Reactions  . Bactrim  [Sulfamethoxazole -Trimethoprim ] Itching    Redness, itching, burning, blotchy feet and hands    SOCIAL & SUBSTANCE USE HISTORY  Social History   Socioeconomic History  . Marital status: Single    Spouse name: Not on file  . Number of children: Not on file  . Years of education: Not on file  . Highest education level: Not on file  Occupational History  . Not on file  Tobacco Use  . Smoking status:  Every Day    Current packs/day: 1.00    Average packs/day: 1 pack/day for 24.0 years (24.0 ttl pk-yrs)    Types: Cigarettes  . Smokeless tobacco: Never  Vaping Use  . Vaping status: Never Used  Substance and Sexual Activity  . Alcohol use: No  . Drug use: No  . Sexual activity: Yes    Birth  control/protection: Surgical    Comment: tubal  Other Topics Concern  . Not on file  Social History Narrative  . Not on file   Social Drivers of Health   Financial Resource Strain: Low Risk  (10/28/2020)   Overall Financial Resource Strain (CARDIA)   . Difficulty of Paying Living Expenses: Not hard at all  Food Insecurity: No Food Insecurity (10/28/2020)   Hunger Vital Sign   . Worried About Programme researcher, broadcasting/film/video in the Last Year: Never true   . Ran Out of Food in the Last Year: Never true  Transportation Needs: No Transportation Needs (10/28/2020)   PRAPARE - Transportation   . Lack of Transportation (Medical): No   . Lack of Transportation (Non-Medical): No  Physical Activity: Sufficiently Active (10/28/2020)   Exercise Vital Sign   . Days of Exercise per Week: 3 days   . Minutes of Exercise per Session: 60 min  Stress: No Stress Concern Present (10/28/2020)   [employer] of Occupational Health - Occupational Stress Questionnaire   . Feeling of Stress : Not at all  Social Connections: Moderately Isolated (10/28/2020)   Social Connection and Isolation Panel [NHANES]   . Frequency of Communication with Friends and Family: More than three times a week   . Frequency of Social Gatherings with Friends and Family: More than three times a week   . Attends Religious Services: Never   . Active Member of Clubs or Organizations: Yes   . Attends Banker Meetings: More than 4 times per year   . Marital Status: Never married   Social History   Tobacco Use  Smoking Status Every Day  . Current packs/day: 1.00  . Average packs/day: 1 pack/day for 24.0 years (24.0 ttl pk-yrs)  . Types: Cigarettes  Smokeless Tobacco Never   Social History   Substance and Sexual Activity  Alcohol Use No   Social History   Substance and Sexual Activity  Drug Use No    Additional pertinent information ***.  FAMILY HISTORY  Family History  Problem Relation Age of Onset  . Hypertension  Mother   . Cancer Father        melanoma  . Cancer Sister   . Diabetes Paternal Grandfather    Family Psychiatric History (if known):  ***  MENTAL STATUS EXAM (MSE)  Mental Status Exam: General Appearance: {Appearance:22683}  Orientation:  {BHH ORIENTATION (PAA):22689}  Memory:  {BHH MEMORY:22881}  Concentration:  {Concentration:21399}  Recall:  {BHH GOOD/FAIR/POOR:22877}  Attention  {BH Attention Span:31825}  Eye Contact:  {BHH EYE CONTACT:22684}  Speech:  {Speech:22685}  Language:  {BHH GOOD/FAIR/POOR:22877}  Volume:  {Volume (PAA):22686}  Mood: ***  Affect:  {Affect (PAA):22687}  Thought Process:  {Thought Process (PAA):22688}  Thought Content:  {Thought Content:22690}  Suicidal Thoughts:  {ST/HT (PAA):22692}  Homicidal Thoughts:  {ST/HT (PAA):22692}  Judgement:  {Judgement (PAA):22694}  Insight:  {Insight (PAA):22695}  Psychomotor Activity:  {Psychomotor (PAA):22696}  Akathisia:  {BHH YES OR XB:14782}  Fund of Knowledge:  {BHH GOOD/FAIR/POOR:22877}    Assets:  {Assets (PAA):22698}  Cognition:  {chl bhh cognition:304700322}  ADL's:  {BHH NFA'O:13086}  AIMS (if indicated):       VITALS  Blood pressure (!) 147/104, pulse (!) 114, temperature 98.1 F (36.7 C), resp. rate 18, height 5\' 2"  (1.575 m), weight 76.2 kg, SpO2 99%.  LABS  Admission on 11/25/2023  Component Date Value Ref Range Status  . Sodium 11/25/2023 134 (L)  135 - 145 mmol/L Final  . Potassium 11/25/2023 4.0  3.5 - 5.1 mmol/L Final  . Chloride 11/25/2023 103  98 - 111 mmol/L Final  . CO2 11/25/2023 25  22 - 32 mmol/L Final  . Glucose, Bld 11/25/2023 114 (H)  70 - 99 mg/dL Final   Glucose reference range applies only to samples taken after fasting for at least 8 hours.  . BUN 11/25/2023 6  6 - 20 mg/dL Final  . Creatinine, Ser 11/25/2023 1.06 (H)  0.44 - 1.00 mg/dL Final  . Calcium  11/25/2023 8.9  8.9 - 10.3 mg/dL Final  . Total Protein 11/25/2023 7.3  6.5 - 8.1 g/dL Final  . Albumin  11/25/2023 4.2   3.5 - 5.0 g/dL Final  . AST 65/78/4696 11 (L)  15 - 41 U/L Final  . ALT 11/25/2023 9  0 - 44 U/L Final  . Alkaline Phosphatase 11/25/2023 58  38 - 126 U/L Final  . Total Bilirubin 11/25/2023 0.8  0.0 - 1.2 mg/dL Final  . GFR, Estimated 11/25/2023 >60  >60 mL/min Final   Comment: (NOTE) Calculated using the CKD-EPI Creatinine Equation (2021)   . Anion gap 11/25/2023 6  5 - 15 Final   Performed at [facility], [address] [place], [place] [address]  . Alcohol, Ethyl (B) 11/25/2023 <15  <15 mg/dL Final   Comment: Please note change in reference range. (NOTE) For medical purposes only. Performed at [facility], [address] [place], [place] [address]   . Opiates 11/25/2023 NONE DETECTED  NONE DETECTED Final  . Cocaine 11/25/2023 NONE DETECTED  NONE DETECTED Final  . Benzodiazepines 11/25/2023 NONE DETECTED  NONE DETECTED Final  . Amphetamines 11/25/2023 NONE DETECTED  NONE DETECTED Final  . Tetrahydrocannabinol 11/25/2023 POSITIVE (A)  NONE DETECTED Final  . Barbiturates 11/25/2023 NONE DETECTED  NONE DETECTED Final   Comment: (NOTE) DRUG SCREEN FOR MEDICAL PURPOSES ONLY.  IF CONFIRMATION IS NEEDED FOR ANY PURPOSE, NOTIFY LAB WITHIN 5 DAYS.  LOWEST DETECTABLE LIMITS FOR URINE DRUG SCREEN Drug Class                     Cutoff (ng/mL) Amphetamine and metabolites    1000 Barbiturate and metabolites    200 Benzodiazepine                 200 Opiates and metabolites        300 Cocaine and metabolites        300 THC                            50 Performed at [facility], [address], [place], [place] [address]   . WBC 11/25/2023 7.9  4.0 - 10.5 K/uL Final  . RBC 11/25/2023 5.39 (H)  3.87 - 5.11 MIL/uL Final  . Hemoglobin 11/25/2023 15.4 (H)  12.0 - 15.0 g/dL Final  . HCT 72/53/6644 46.5 (H)  36.0 - 46.0 % Final  . MCV 11/25/2023 86.3  80.0 - 100.0 fL Final  . MCH 11/25/2023 28.6  26.0 - 34.0 pg Final  . MCHC 11/25/2023 33.1  30.0 -  36.0 g/dL Final  . RDW 04/54/0981 13.2   11.5 - 15.5 % Final  . Platelets 11/25/2023 283  150 - 400 K/uL Final  . nRBC 11/25/2023 0.0  0.0 - 0.2 % Final  . Neutrophils Relative % 11/25/2023 79  % Final  . Neutro Abs 11/25/2023 6.2  1.7 - 7.7 K/uL Final  . Lymphocytes Relative 11/25/2023 16  % Final  . Lymphs Abs 11/25/2023 1.3  0.7 - 4.0 K/uL Final  . Monocytes Relative 11/25/2023 5  % Final  . Monocytes Absolute 11/25/2023 0.4  0.1 - 1.0 K/uL Final  . Eosinophils Relative 11/25/2023 0  % Final  . Eosinophils Absolute 11/25/2023 0.0  0.0 - 0.5 K/uL Final  . Basophils Relative 11/25/2023 0  % Final  . Basophils Absolute 11/25/2023 0.0  0.0 - 0.1 K/uL Final  . Immature Granulocytes 11/25/2023 0  % Final  . Abs Immature Granulocytes 11/25/2023 0.02  0.00 - 0.07 K/uL Final   Performed at [facility], [address], [place], [place] [address]  . Preg Test, Ur 11/25/2023 NEGATIVE  NEGATIVE Final   Comment:        THE SENSITIVITY OF THIS METHODOLOGY IS >25 mIU/mL. Performed at [facility], [address] [place], [place] [address]     PSYCHIATRIC REVIEW OF SYSTEMS (ROS)  ROS: Notable for the following relevant positive findings: ROS  Additional findings:      Musculoskeletal: {Musculoskeletal neeeds/assessment:304550014}      Gait & Station: {Gait and Station:304550016}      Pain Screening: {Pain Description:304550015}      Nutrition & Dental Concerns: {Nutrition & Dental Concerns:304550017}  RISK FORMULATION/ASSESSMENT  Is the patient experiencing any suicidal or homicidal ideations: {yes/no:20286}       Explain if yes: *** Protective factors considered for safety management: ***  Risk factors/concerns considered for safety management: *** {CHL BH Risk Factors Safety Management:304550011}  Is there a safety management plan with the patient and treatment team to minimize risk factors and promote protective factors: {yes/no:20286}           Explain: *** Is crisis care placement or psychiatric hospitalization recommended:  {yes/no:20286}     Based on my current evaluation and risk assessment, patient is determined at this time to be at:  {Risk level:304550009}  *RISK ASSESSMENT Risk assessment is a dynamic process; it is possible that this patient's condition, and risk level, may change. This should be re-evaluated and managed over time as appropriate. Please re-consult psychiatric consult services if additional assistance is needed in terms of risk assessment and management. If your team decides to discharge this patient, please advise the patient how to best access emergency psychiatric services, or to call 911, if their condition worsens or they feel unsafe in any way.   Threasa Flood, NP Telepsychiatry Consult Services

## 2023-11-25 NOTE — Consult Note (Signed)
 [facility] Consult Note  Patient Name: Pamela Horn MRN: [id number] DOB: 07/17/1980 DATE OF Consult: 11/25/2023  PRIMARY PSYCHIATRIC DIAGNOSES  1.  Unspecified psychosis 2.  Cannabis abuse 3.  History of opioid use disorder, in sustained remission  RECOMMENDATIONS  Admit to inpatient psych for safety and Stabilization. Medication recommendations: start Zyprexa 5mg  PO QHS for psychosis Give Droperidol 5mg  IM Q8H PRN and Lorazepam  1mg  IM Q8H PRN for agitation (Droperidol and Lorazepam  should be given 1 hour apart due to risk of respiratory depression). Please do not exceed 20 mg of Zyprexa within a 24-hour period. Please stop all antipsychotic and QTc prolonging medications if patient's QTc is greater than 500 ms.  Non-Medication/therapeutic recommendations: Refer to outpatient psychiatric provider for medication management and therapy upon discharge from inpatient psych admission.   Communication: Treatment team members (and family members if applicable) who were involved in treatment/care discussions and planning, and with whom we spoke or engaged with via secure text/chat, include the following:  patient's treatment team.  Thank you for involving us  in the care of this patient. If you have any additional questions or concerns, please call [phone] and ask for me or the provider on-call.  TELEPSYCHIATRY ATTESTATION & CONSENT  As the provider for this telehealth consult, I attest that I verified the patient's identity using two separate identifiers, introduced myself to the patient, provided my credentials, disclosed my location, and performed this encounter via a HIPAA-compliant, real-time, face-to-face, two-way, interactive audio and video platform and with the full consent and agreement of the patient (or guardian as applicable.)  Patient physical location: [facility]. Telehealth provider physical location: home office in state of [place].  Video start time: 2230 (Central  Time) Video end time: 2248 (Central Time)  IDENTIFYING DATA  Pamela Horn is a 43 y.o. year-old female for whom a psychiatric consultation has been ordered by the primary provider. The patient was identified using two separate identifiers.  CHIEF COMPLAINT/REASON FOR CONSULT  - "I'm very frustrated and I'm mad because I've been sitting still for eight hours in one spot and cannot move against my will. I'm very frustrated." - Pt. presented following involuntary commitment by her mother, expressing frustration with hospitalization and disputing concerns about substance use and child care raised by her mother.  HISTORY OF PRESENT ILLNESS (HPI)  A 43 year old woman was evaluated following involuntary commitment initiated by her mother. Per IVC, has been using pills as well as marijuana and abusing alcohol.  She had also reported that her and her dog are being electrocuted regularly walking around her home.  Mother reported that patient sees ghosts as well and does not adequately tending to her children.  During this evaluation, patient She expressed significant frustration and anger related to her current hospitalization, particularly due to prolonged waiting and communication difficulties during the assessment. She attributed her admission to longstanding interpersonal conflict with her mother, whom she described as projecting her own shortcomings and feelings of maternal inadequacy onto her. The patient recounted a history of childhood abandonment by her mother, resulting in periods of homelessness during adolescence, but emphasized her resilience in graduating high school despite these challenges.  She denied any prior psychiatric hospitalizations or outpatient psychiatric care, and reported no history of psychiatric medication use. No prior suicide attempts were endorsed, and she stated she is not experiencing depressive symptoms, suicidal ideation, or homicidal ideation. Sleep and appetite were  described as adequate, with the exception of acute hunger due to prolonged waiting in  the hospital. She denied perceptual disturbances such as auditory or visual hallucinations. She did, however, describe unusual somatic and delusional experiences, specifically a sensation of being shocked by electricity, which she attributed to a "magnetic pull" between herself and her soulmate. She reported that these sensations are exacerbated by proximity to power poles and passing vehicles, and that she sustained a burn on her shoulder during an EKG procedure. She reports that the symptoms can only resolve when she is physically near her soulmate. She reports that she and her soulmate where together for 9 years and that they love each other but have not been together because they did bad things to each other and then states "I cant force someone to be with me right but we are getting there".  Substance use history was notable for past opioid (hydrocodone , Percocet) misuse, with self-reported sobriety for six to seven years, except for ongoing marijuana use, which was confirmed by urine toxicology. She denied current or recent use of other substances, including alcohol. Family history was significant for a maternal uncle who died by suicide and a general history of substance use in the family. 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 She denied any current difficulties in caring for her children and described an active, engaged parenting style.  Overall, the patient presented with prominent frustration regarding her hospitalization, a complex family dynamic, and somatic complaints with delusional features. Agitation was also noted.  PAST PSYCHIATRIC HISTORY  - No prior psychiatric diagnoses, suicide attempts, or self-harm reported. - Engaged in therapy in the past, not currently in treatment. No prior psychiatric hospitalizations  or admissions. - No history of prescribed psychiatric medications reported though chart review show patient being prescribed Effexor , Abilify, Wellbutrin, Gabapentin  and Suboxone .  PAST MEDICAL HISTORY  Past Medical History:  Diagnosis Date   History of kidney stones    Pre-eclampsia    1st pregnancy   Pregnancy induced hypertension      HOME MEDICATIONS  Facility Ordered Medications  Medication   [COMPLETED] acetaminophen  (TYLENOL ) tablet 650 mg   PTA Medications  Medication Sig   venlafaxine  XR (EFFEXOR  XR) 37.5 MG 24 hr capsule Take 1 capsule (37.5 mg total) by mouth daily with breakfast. (Patient not taking: Reported on 06/13/2023)   gabapentin  (NEURONTIN ) 100 MG capsule Take 100 mg by mouth at bedtime.   buPROPion (WELLBUTRIN XL) 150 MG 24 hr tablet Take 150 mg by mouth daily.   SUBOXONE  8-2 MG FILM Place 0.5 Film under the tongue 4 (four) times daily.   baclofen (LIORESAL) 10 MG tablet Take 10 mg by mouth 2 (two) times daily as needed.   doxycycline  (VIBRAMYCIN ) 100 MG capsule Take 1 capsule (100 mg total) by mouth 2 (two) times daily.   QUEtiapine (SEROQUEL) 50 MG tablet Take 50-100 mg by mouth at bedtime.   ARIPiprazole (ABILIFY) 5 MG tablet Take 5 mg by mouth daily.     ALLERGIES  Allergies  Allergen Reactions   Bactrim  [Sulfamethoxazole -Trimethoprim ] Itching    Redness, itching, burning, blotchy feet and hands    SOCIAL & SUBSTANCE USE HISTORY  Social History   Socioeconomic History   Marital status: Single    Spouse name: Not on file   Number of children: Not on file   Years of education: Not on file   Highest education level: Not on file  Occupational History   Not on file  Tobacco Use   Smoking status: Every Day    Current packs/day: 1.00    Average packs/day: 1 pack/day for 24.0 years (24.0 ttl pk-yrs)    Types: Cigarettes   Smokeless tobacco: Never  Vaping Use   Vaping status: Never Used  Substance and Sexual Activity   Alcohol use: No   Drug use:  No   Sexual activity: Yes    Birth control/protection: Surgical    Comment: tubal  Other Topics Concern   Not on file  Social History Narrative   Not on file   Social Drivers of Health   Financial Resource Strain: Low Risk  (10/28/2020)   Overall Financial Resource Strain (CARDIA)    Difficulty of Paying Living Expenses: Not hard at all  Food Insecurity: No Food Insecurity (10/28/2020)   Hunger Vital Sign    Worried About Running Out of Food in the Last Year: Never true    Ran Out of Food in the Last Year: Never true  Transportation Needs: No Transportation Needs (10/28/2020)   PRAPARE - Administrator, Civil Service (Medical): No    Lack of Transportation (Non-Medical): No  Physical Activity: Sufficiently Active (10/28/2020)   Exercise Vital Sign    Days of Exercise per Week: 3 days    Minutes of Exercise per Session: 60 min  Stress: No Stress Concern Present (10/28/2020)   [employer] of Occupational Health - Occupational Stress Questionnaire    Feeling of Stress : Not at all  Social Connections: Moderately Isolated (10/28/2020)   Social Connection and Isolation Panel [NHANES]    Frequency of Communication with Friends and Family: More than three times a week    Frequency of Social Gatherings with Friends and Family: More than three times a week    Attends Religious Services: Never    Database administrator or Organizations: Yes    Attends Engineer, structural: More than 4 times per year    Marital Status: Never married   Social History   Tobacco Use  Smoking Status Every Day   Current packs/day: 1.00   Average packs/day: 1 pack/day for 24.0 years (24.0 ttl pk-yrs)   Types: Cigarettes  Smokeless Tobacco Never   Social History   Substance and Sexual Activity  Alcohol Use No   Social History   Substance and Sexual Activity  Drug Use No    Additional pertinent information .  FAMILY HISTORY  Family History  Problem Relation Age of Onset    Hypertension Mother    Cancer Father        melanoma   Cancer Sister    Diabetes Paternal Grandfather    Family Psychiatric History (if known):  Patient reported no known psychiatric history in the family, except for her mother's brother, who reportedly died by suicide.   MENTAL STATUS EXAM (MSE)  Mental Status Exam: General Appearance:  wearing hospital scrubs, cooperative, though frustrated and irritable   Orientation:  Alert and oriented.  Memory:  Appropriate  Concentration:  fair  Recall:    Attention  Fair  Eye Contact:  Fair  Speech:  Clear and Coherent  Language:  Good  Volume:  Normal  Mood:  "I love life too much" and "I really don't [feel depressed]"  Affect:  appeared congruent with the content discussed, showing frustration and anger   Thought Process:  Goal Directed and Linear  Thought Content:  Pt. described a belief in a "magnetic pull" between herself and her soulmate, attributing  physical sensations and electrical phenomena to this connection. She denied auditory and visual hallucinations, paranoia, and any thoughts of harm to self or others. She expressed frustration with her mother and described a history of being misunderstood. There is no mention of auditory hallucinations, visual hallucinations, paranoia, obsessive thoughts or intrusive thoughts.  Suicidal Thoughts:  No  Homicidal Thoughts:  No  Judgement:  limited  Insight:  limited  Psychomotor Activity:  Normal  Akathisia:  Negative  Fund of Knowledge:  Fair    Assets:  Communication Skills Housing  Cognition:  WNL  ADL's:  Intact  AIMS (if indicated):       VITALS  Blood pressure (!) 147/104, pulse (!) 114, temperature 98.1 F (36.7 C), resp. rate 18, height 5\' 2"  (1.575 m), weight 76.2 kg, SpO2 99%.  LABS  Admission on 11/25/2023  Component Date Value Ref Range Status   Sodium 11/25/2023 134 (L)  135 - 145 mmol/L Final   Potassium 11/25/2023 4.0  3.5 - 5.1 mmol/L Final   Chloride 11/25/2023  103  98 - 111 mmol/L Final   CO2 11/25/2023 25  22 - 32 mmol/L Final   Glucose, Bld 11/25/2023 114 (H)  70 - 99 mg/dL Final   Glucose reference range applies only to samples taken after fasting for at least 8 hours.   BUN 11/25/2023 6  6 - 20 mg/dL Final   Creatinine, Ser 11/25/2023 1.06 (H)  0.44 - 1.00 mg/dL Final   Calcium  11/25/2023 8.9  8.9 - 10.3 mg/dL Final   Total Protein 96/10/5407 7.3  6.5 - 8.1 g/dL Final   Albumin  11/25/2023 4.2  3.5 - 5.0 g/dL Final   AST 81/19/1478 11 (L)  15 - 41 U/L Final   ALT 11/25/2023 9  0 - 44 U/L Final   Alkaline Phosphatase 11/25/2023 58  38 - 126 U/L Final   Total Bilirubin 11/25/2023 0.8  0.0 - 1.2 mg/dL Final   GFR, Estimated 11/25/2023 >60  >60 mL/min Final   Comment: (NOTE) Calculated using the CKD-EPI Creatinine Equation (2021)    Anion gap 11/25/2023 6  5 - 15 Final   Performed at [facility], [address] [place], [place] [address]   Alcohol, Ethyl (B) 11/25/2023 <15  <15 mg/dL Final   Comment: Please note change in reference range. (NOTE) For medical purposes only. Performed at [facility], [address], [place], [place] [address]    Opiates 11/25/2023 NONE DETECTED  NONE DETECTED Final   Cocaine 11/25/2023 NONE DETECTED  NONE DETECTED Final   Benzodiazepines 11/25/2023 NONE DETECTED  NONE DETECTED Final   Amphetamines 11/25/2023 NONE DETECTED  NONE DETECTED Final   Tetrahydrocannabinol 11/25/2023 POSITIVE (A)  NONE DETECTED Final   Barbiturates 11/25/2023 NONE DETECTED  NONE DETECTED Final   Comment: (NOTE) DRUG SCREEN FOR MEDICAL PURPOSES ONLY.  IF CONFIRMATION IS NEEDED FOR ANY PURPOSE, NOTIFY LAB WITHIN 5 DAYS.  LOWEST DETECTABLE LIMITS FOR URINE DRUG SCREEN Drug Class                     Cutoff (ng/mL) Amphetamine and metabolites    1000 Barbiturate and metabolites    200 Benzodiazepine                 200 Opiates and metabolites        300 Cocaine and metabolites        300 THC  50 Performed at [facility], [address] [place], [place] [address]    WBC 11/25/2023 7.9  4.0 - 10.5 K/uL Final   RBC 11/25/2023 5.39 (H)  3.87 - 5.11 MIL/uL Final   Hemoglobin 11/25/2023 15.4 (H)  12.0 - 15.0 g/dL Final   HCT 60/45/4098 46.5 (H)  36.0 - 46.0 % Final   MCV 11/25/2023 86.3  80.0 - 100.0 fL Final   MCH 11/25/2023 28.6  26.0 - 34.0 pg Final   MCHC 11/25/2023 33.1  30.0 - 36.0 g/dL Final   RDW 11/91/4782 13.2  11.5 - 15.5 % Final   Platelets 11/25/2023 283  150 - 400 K/uL Final   nRBC 11/25/2023 0.0  0.0 - 0.2 % Final   Neutrophils Relative % 11/25/2023 79  % Final   Neutro Abs 11/25/2023 6.2  1.7 - 7.7 K/uL Final   Lymphocytes Relative 11/25/2023 16  % Final   Lymphs Abs 11/25/2023 1.3  0.7 - 4.0 K/uL Final   Monocytes Relative 11/25/2023 5  % Final   Monocytes Absolute 11/25/2023 0.4  0.1 - 1.0 K/uL Final   Eosinophils Relative 11/25/2023 0  % Final   Eosinophils Absolute 11/25/2023 0.0  0.0 - 0.5 K/uL Final   Basophils Relative 11/25/2023 0  % Final   Basophils Absolute 11/25/2023 0.0  0.0 - 0.1 K/uL Final   Immature Granulocytes 11/25/2023 0  % Final   Abs Immature Granulocytes 11/25/2023 0.02  0.00 - 0.07 K/uL Final   Performed at [facility], [address], [place], [place] [address]   Preg Test, Ur 11/25/2023 NEGATIVE  NEGATIVE Final   Comment:        THE SENSITIVITY OF THIS METHODOLOGY IS >25 mIU/mL. Performed at [facility], [address], [place], [place] [address]     PSYCHIATRIC REVIEW OF SYSTEMS (ROS)  ROS: Notable for the following relevant positive findings: ROS  Additional findings:      Musculoskeletal: No abnormal movements observed      Gait & Station: Laying/Sitting      Pain Screening: Denies      Nutrition & Dental Concerns: none reported  RISK FORMULATION/ASSESSMENT  Is the patient experiencing any suicidal or homicidal ideations: No       Explain if yes:  Protective factors considered for safety management: access to appropriate  clinical services, responsibility towards her children  Risk factors/concerns considered for safety management: include a history of childhood abandonment, family history of suicide (maternal uncle), prior substance use disorder (opioid pain pills), current agitation and current involuntary hospitalization.   Is there a safety management plan with the patient and treatment team to minimize risk factors and promote protective factors: Yes           Explain: admit to psych Is crisis care placement or psychiatric hospitalization recommended: Yes     Based on my current evaluation and risk assessment, patient is determined at this time to be at:  Moderate Risk  *RISK ASSESSMENT Risk assessment is a dynamic process; it is possible that this patient's condition, and risk level, may change. This should be re-evaluated and managed over time as appropriate. Please re-consult psychiatric consult services if additional assistance is needed in terms of risk assessment and management. If your team decides to discharge this patient, please advise the patient how to best access emergency psychiatric services, or to call 911, if their condition worsens or they feel unsafe in any way.   Threasa Flood, NP Telepsychiatry Consult Services

## 2023-11-25 NOTE — ED Provider Notes (Cosign Needed Addendum)
 Pamela Horn EMERGENCY DEPARTMENT AT [facility] Provider Note   CSN: [id number] Arrival date & time: 11/25/23  1528     History  Chief Complaint  Patient presents with   Psychiatric Evaluation    Pamela Horn is a 43 y.o. female.  Patient presents today with police under IVC.  IVC paperwork obtained by the patient's mother and states the patient has been using pills as well as marijuana and abusing alcohol.  She has also reported that her and her dog are being electrocuted regularly walking around her home.  Reports that she sees ghosts as well and does not adequately tending to her children. Upon my assessment, patient is agitated. She does confirm that she is being electrocuted "constantly." She denies any somatic complaints. No SI/HI.  The history is provided by the patient. No language interpreter was used.       Home Medications Prior to Admission medications   Medication Sig Start Date End Date Taking? Authorizing Provider  baclofen (LIORESAL) 10 MG tablet Take 10 mg by mouth 2 (two) times daily as needed. 02/13/23   [provider]  buPROPion (WELLBUTRIN XL) 150 MG 24 hr tablet Take 150 mg by mouth daily. 06/22/23   [provider]  doxycycline  (VIBRAMYCIN ) 100 MG capsule Take 1 capsule (100 mg total) by mouth 2 (two) times daily. 07/18/23   Triplett, Tammy, PA-C  gabapentin  (NEURONTIN ) 100 MG capsule Take 100 mg by mouth at bedtime. 05/03/23   [provider]  QUEtiapine (SEROQUEL) 25 MG tablet Take 50 mg by mouth at bedtime. 06/22/23   [provider]  SUBOXONE  8-2 MG FILM Place 0.5 Film under the tongue 4 (four) times daily. 07/14/23   [provider]  venlafaxine  XR (EFFEXOR  XR) 37.5 MG 24 hr capsule Take 1 capsule (37.5 mg total) by mouth daily with breakfast. Patient not taking: Reported on 06/13/2023 11/25/21   Javan Messing, NP      Allergies    Bactrim  [sulfamethoxazole -trimethoprim ]    Review of Systems    Review of Systems  Psychiatric/Behavioral:  Positive for agitation.   All other systems reviewed and are negative.   Physical Exam Updated Vital Signs BP (!) 147/104   Pulse (!) 114   Temp 98.1 F (36.7 C)   Resp 18   Ht 5\' 2"  (1.575 m)   Wt 76.2 kg   SpO2 99%   BMI 30.73 kg/m  Physical Exam Vitals and nursing note reviewed.  Constitutional:      General: She is not in acute distress.    Appearance: Normal appearance. She is normal weight. She is not ill-appearing, toxic-appearing or diaphoretic.  HENT:     Head: Normocephalic and atraumatic.  Cardiovascular:     Rate and Rhythm: Normal rate.  Pulmonary:     Effort: Pulmonary effort is normal. No respiratory distress.  Musculoskeletal:        General: Normal range of motion.     Cervical back: Normal range of motion.  Skin:    General: Skin is warm and dry.  Neurological:     General: No focal deficit present.     Mental Status: She is alert.  Psychiatric:     Comments: Agitated on exam     ED Results / Procedures / Treatments   Labs (all labs ordered are listed, but only abnormal results are displayed) Labs Reviewed  COMPREHENSIVE METABOLIC PANEL WITH GFR  ETHANOL  RAPID URINE DRUG SCREEN, HOSP PERFORMED  CBC WITH  DIFFERENTIAL/PLATELET  POC URINE PREG, ED    EKG None  Radiology No results found.  Procedures Procedures    Medications Ordered in ED Medications - No data to display  ED Course/ Medical Decision Making/ A&P                                 Medical Decision Making Amount and/or Complexity of Data Reviewed Labs: ordered.   Patient is a 43 y.o. female  who presents to the emergency department for psychiatric complaint.  Past Medical History:  has a past medical history of History of kidney stones, Pre-eclampsia, and Pregnancy induced hypertension.  Physical Exam: Agitated on exam  Labs: Medical clearance labs ordered, with following pertinent results: hgb 15.4, Na 134,  creatinine 1.06. UDS + THC  Disposition: Patient is medically cleared at this time. Will consult TTS and appreciate their recommendations.   Final Clinical Impression(s) / ED Diagnoses Final diagnoses:  Paranoia [facility])    Rx / DC Orders ED Discharge Orders     None         Fredna Jasper 11/25/23 1812    Sherra Dk, PA-C 11/25/23 1933    Ninetta Basket, MD 11/26/23 [phone]

## 2023-11-25 NOTE — BH Assessment (Signed)
@  2035, Patient was deferred to [facility] for a telepsych assessment. The assigned care coordinator will provide updates regarding the scheduling of the assessment in secure chat. [facility] care coordinator can be reached at [phone] for further information on the timing of the telepsych evaluation.

## 2023-11-25 NOTE — ED Notes (Signed)
 Pts IVC papers have been faxed to [facility] @[phone] 

## 2023-11-25 NOTE — ED Notes (Addendum)
 Pt took off all jewelry put it into a bag put a phone and shoes into patient belonging bag and put into locker number 2. Pt changed into purple BH scrubs. Security called to wand pt.

## 2023-11-25 NOTE — ED Notes (Signed)
 Pt stated, "it's my damn shoulder. The frequencies from ypur ek machine

## 2023-11-25 NOTE — ED Notes (Signed)
 EKG done on pt by this tech, pt asked if their was any metal in the stickers that are put on the chest and this tech told her no that their was just plastic. Pt then took the lead and said "look this is metal right here". This tech tried to hurry up with the EKG because pt stated "I can feel the electricity going through my body can you hurry up" After we finished up the EKG pt said "this metal burnt me I can feel it." Pt got up and came over to this tech and pt grabbed sitters hand and said "you are my witness, you feel how hot this is? The metal burnt me".

## 2023-11-25 NOTE — ED Notes (Signed)
 Pt in bed crying and repeating "please help me God."

## 2023-11-25 NOTE — ED Notes (Signed)
 Reached out to [facility] coordinator per pt request for time of TTS. Coordinator stated, "it can be up to two hours from now."

## 2023-11-26 DIAGNOSIS — F1210 Cannabis abuse, uncomplicated: Secondary | ICD-10-CM

## 2023-11-26 DIAGNOSIS — F29 Unspecified psychosis not due to a substance or known physiological condition: Secondary | ICD-10-CM

## 2023-11-26 MED ORDER — NICOTINE 14 MG/24HR TD PT24
14.0000 mg | MEDICATED_PATCH | Freq: Once | TRANSDERMAL | Status: AC
  Administered 2023-11-26: 14 mg via TRANSDERMAL
  Filled 2023-11-26: qty 1

## 2023-11-26 MED ORDER — LORAZEPAM 1 MG PO TABS
1.0000 mg | ORAL_TABLET | Freq: Four times a day (QID) | ORAL | Status: DC | PRN
  Administered 2023-11-26 – 2023-11-27 (×4): 1 mg via ORAL
  Filled 2023-11-26 (×4): qty 1

## 2023-11-26 MED ORDER — NICOTINE POLACRILEX 2 MG MT GUM: 2.0000 mg | CHEWING_GUM | OROMUCOSAL | Status: DC | PRN

## 2023-11-26 NOTE — ED Notes (Signed)
 Round check is complete at this time. Pt is resting with no s/s of distress. Will try to obtain VS when pt wakes up.

## 2023-11-26 NOTE — Progress Notes (Signed)
 CSW sent additional BH referral information to [facility] for review. CSW will continue to seek recommended disposition.    Tarris Delbene, MSW, LCSW-A  5:24 PM 11/26/2023

## 2023-11-26 NOTE — ED Provider Notes (Signed)
 Emergency Medicine Observation Re-evaluation Note  Pamela Horn is a 43 y.o. female, seen on rounds today.  Pt initially presented to the ED for complaints of Psychiatric Evaluation Currently, the patient is resting comfortably.  Physical Exam  BP (!) 147/104   Pulse (!) 114   Temp 98.1 F (36.7 C)   Resp 18   Ht 5\' 2"  (1.575 m)   Wt 76.2 kg   SpO2 99%   BMI 30.73 kg/m  Physical Exam General: Resting comfortably in stretcher Lungs: Normal work of breathing Psych: Calm  ED Course / MDM  EKG:   I have reviewed the labs performed to date as well as medications administered while in observation.  Recent changes in the last 24 hours include seen by psychiatry given recommendations on medications.  They recommend inpatient placement.  Plan  Current plan is for placement.    Ninetta Basket, MD 11/26/23 [phone]

## 2023-11-26 NOTE — ED Notes (Signed)
 Pt stated to sitter while getting vitals that " my blood pressure is going to be high because the conversation me and the nurse just had. That makes me look bad, because my blood pressure is high. This is a whole system and yall know how to work the system. If you come back later and get my blood pressure it wont be high."

## 2023-11-26 NOTE — ED Notes (Signed)
 Round check is complete at this time. Pt was updated on the process to be admitted to inpatient facility.  Pt expressed she would like to continue her outpatient therapy. Telepsych was notified about pt's request over Epic messenger. Pt has no s/s of distress.

## 2023-11-26 NOTE — ED Notes (Signed)
Pt requesting ativan.

## 2023-11-26 NOTE — ED Notes (Signed)
Round check is complete at this time. Pt denies needing anything like food, drink or bathroom. Pt has no s/s of distress.

## 2023-11-26 NOTE — ED Notes (Signed)
Pt TTS at this time 

## 2023-11-26 NOTE — ED Notes (Addendum)
 Pt refusing prescribed meds at this time but is willing to take ativan  in PO form. Pt wants to see what is going into their body. Pt states they do not like the sound of "zyprexa" sounds like a psych drug, "I'm absolutely not taking that"  Pt continues to talk about electricity and electrical burns on pt's dogs d/t magnetic pull. Pt states that their soul mate and them are being drawn together d/t the poles turning up too high. Pt want's a doctor to look at "evidence" of electricity of electrical worm flying through the air at them.

## 2023-11-26 NOTE — ED Notes (Signed)
 Pt requested to take a shower, brush her teeth, and something for sleep to prepare to go to bed. All were performed around this time. Pt understands that nothing will happen tonight about her being admitted or discharged. Pt is made aware that telepsych can be contacted in the morning.

## 2023-11-26 NOTE — ED Notes (Signed)
 Pt voiced she described the electrical sensation incorrectly and it was a tingling sensation. Pt feels she is not crazy but thinks her mom is reflecting her own feeling of abandonment , from where her mother left her at 31 to be on the streets.  Pt requesting nicotine  patch due to she smokes cigarettes.

## 2023-11-26 NOTE — ED Notes (Signed)
 Pt requested grapes for breakfast and dietary sent them .

## 2023-11-26 NOTE — Progress Notes (Signed)
 Patient has been denied by [facility] due to no appropriate beds available. Patient meets [facility] inpatient criteria per Threasa Flood, NP. Patient has been faxed out to the following facilities:   [facility] [address] [place] [place] [address] [phone]  [facility] [address] [place] [address] [phone]  [facility] [address] [place] [address] [phone]  [facility] [place] [address] [place], [place] [address] [phone]  [facility], [place] [phone]  [facility] [address] [place] [address] [phone]  [facility] [address] [place] [address] [phone]  [facility] EFAX [address] [place], [place] [phone]  [facility] [address] [place] [address] [phone]  [facility] [address] [place] [address] [phone]  [facility] [address] [place] [address] [phone]  [facility] [address] [place], [place] [address] [phone]  [facility] [address] [place] [address] [phone]  [facility] [address] [place] [address] [phone]  [facility] [address] [place] [address] [phone]  [facility] [address], [place] [address] [phone]  [facility] [address], [place] [address] [phone]  [phone]   Phares Brasher, MSW, LCSW-A  11:43 AM 11/26/2023

## 2023-11-27 LAB — SARS CORONAVIRUS 2 BY RT PCR: SARS Coronavirus 2 by RT PCR: NEGATIVE

## 2023-11-27 NOTE — ED Notes (Signed)
Round check is complete at this time. Pt is resting with no s/s of distress.

## 2023-11-27 NOTE — ED Notes (Signed)
 Per Dr. Synetta Eves, pt has full capacity to make her own decisions.

## 2023-11-27 NOTE — Progress Notes (Signed)
 Pt has been Accepted to [facility] assignment: ADULT UNIT   Pt meets inpatient criteria per: Threasa Flood NP  Attending Physician will be: Elva Hamburger   Report can be called to: [phone]  Pt can arrive after NEG COVID  Care Team Notified: Roise Cleaver NP, [place]   [place] Constantin LCSW-A   11/27/2023 10:23 AM

## 2023-11-27 NOTE — Progress Notes (Signed)
 LCSW Progress Note  [id number]   Pamela Horn  11/27/2023  9:17 AM  Description:   Inpatient Psychiatric Referral  Patient was recommended inpatient per [facility] NP.  There are [facility] beds at [facility], per [facility] [name] RN. Patient was referred to the following out of network facilities: Destination  Service Provider Address Phone Fax  [facility] [address] [place] [address] [phone]  [facility] [address] [place] [address] [phone]  [facility] [address], [place] [address] [phone]  [facility] [address] [place], [place] [address] [phone]  [facility] Patient Placement [facility], [place] [phone]  [facility] [address] [place] [address] [phone]  [facility] [address] [place] [address] [phone]  [facility] EFAX [address] [place], [place] [phone]  [facility] [address] [place] [address] [phone]  [facility] [address] [place] [address] [phone]  [facility] [address] [place] [address] [phone]  [facility] [address] [place], [place] [address] [phone]  [facility] [address] [place] [address] [phone]  [facility] [address], [place] [address] [phone]  [facility] [address] [place] [address] [phone]   [facility] [address], [place] [address] [phone]  [facility] [address] [place] [address] [phone]      Situation ongoing, CSW to continue following and update chart as more information becomes available.    [place] Alanni Vader, MSW, LCSW  11/27/2023 9:17 AM

## 2023-11-27 NOTE — ED Notes (Signed)
 Round check is complete at this time. Pt woke up to use the restroom.  Pt has no s/s of distress.

## 2023-11-27 NOTE — ED Provider Notes (Signed)
 Emergency Medicine Observation Re-evaluation Note  Pamela Horn is a 43 y.o. female, seen on rounds today.  Pt initially presented to the ED for complaints of Psychiatric Evaluation Currently, the patient is awaiting placement.  Physical Exam  BP (!) 119/94 (BP Location: Right Arm)   Pulse 72   Temp 98 F (36.7 C) (Oral)   Resp 18   Ht 5\' 2"  (1.575 m)   Wt 76.2 kg   SpO2 98%   BMI 30.73 kg/m  Physical Exam Alert and in no acute distress  ED Course / MDM  EKG:EKG Interpretation Date/Time:  Saturday Nov 25 2023 17:34:50 EDT Ventricular Rate:  77 PR Interval:  130 QRS Duration:  78 QT Interval:  386 QTC Calculation: 436 R Axis:   39  Text Interpretation: Normal sinus rhythm Normal ECG No previous ECGs available Confirmed by Shyrl Doyne [phone]) on 11/26/2023 8:55:07 AM  I have reviewed the labs performed to date as well as medications administered while in observation.  Recent changes in the last 24 hours include none.  Plan  Current plan is for placement.    Cheyenne Cotta, MD 11/27/23 [phone]
